# Patient Record
Sex: Female | Born: 1935 | Hispanic: No | Smoking: Former smoker
Health system: Southern US, Community
[De-identification: ages and names within clinical notes are randomized; demographics above are authoritative.]

## PROBLEM LIST (undated history)

## (undated) DIAGNOSIS — I272 Pulmonary hypertension, unspecified: Secondary | ICD-10-CM

## (undated) DIAGNOSIS — K219 Gastro-esophageal reflux disease without esophagitis: Secondary | ICD-10-CM

## (undated) DIAGNOSIS — I313 Pericardial effusion (noninflammatory): Secondary | ICD-10-CM

## (undated) DIAGNOSIS — I340 Nonrheumatic mitral (valve) insufficiency: Secondary | ICD-10-CM

## (undated) DIAGNOSIS — T8859XA Other complications of anesthesia, initial encounter: Secondary | ICD-10-CM

## (undated) DIAGNOSIS — I739 Peripheral vascular disease, unspecified: Secondary | ICD-10-CM

## (undated) DIAGNOSIS — Z950 Presence of cardiac pacemaker: Secondary | ICD-10-CM

## (undated) DIAGNOSIS — I3139 Other pericardial effusion (noninflammatory): Secondary | ICD-10-CM

## (undated) DIAGNOSIS — I469 Cardiac arrest, cause unspecified: Secondary | ICD-10-CM

## (undated) DIAGNOSIS — G4730 Sleep apnea, unspecified: Secondary | ICD-10-CM

## (undated) DIAGNOSIS — T4145XA Adverse effect of unspecified anesthetic, initial encounter: Secondary | ICD-10-CM

## (undated) DIAGNOSIS — J449 Chronic obstructive pulmonary disease, unspecified: Secondary | ICD-10-CM

## (undated) DIAGNOSIS — I442 Atrioventricular block, complete: Secondary | ICD-10-CM

## (undated) DIAGNOSIS — I509 Heart failure, unspecified: Secondary | ICD-10-CM

## (undated) DIAGNOSIS — D649 Anemia, unspecified: Secondary | ICD-10-CM

## (undated) DIAGNOSIS — I779 Disorder of arteries and arterioles, unspecified: Secondary | ICD-10-CM

## (undated) DIAGNOSIS — I214 Non-ST elevation (NSTEMI) myocardial infarction: Secondary | ICD-10-CM

## (undated) DIAGNOSIS — I2510 Atherosclerotic heart disease of native coronary artery without angina pectoris: Secondary | ICD-10-CM

## (undated) DIAGNOSIS — I10 Essential (primary) hypertension: Secondary | ICD-10-CM

## (undated) HISTORY — DX: Atrioventricular block, complete: I44.2

## (undated) HISTORY — PX: TONSILLECTOMY AND ADENOIDECTOMY: SHX28

## (undated) HISTORY — DX: Pulmonary hypertension, unspecified: I27.20

## (undated) HISTORY — DX: Disorder of arteries and arterioles, unspecified: I77.9

## (undated) HISTORY — DX: Pericardial effusion (noninflammatory): I31.3

## (undated) HISTORY — DX: Heart failure, unspecified: I50.9

## (undated) HISTORY — DX: Non-ST elevation (NSTEMI) myocardial infarction: I21.4

## (undated) HISTORY — PX: INSERT / REPLACE / REMOVE PACEMAKER: SUR710

## (undated) HISTORY — PX: BREAST LUMPECTOMY: SHX2

## (undated) HISTORY — DX: Sleep apnea, unspecified: G47.30

## (undated) HISTORY — DX: Atherosclerotic heart disease of native coronary artery without angina pectoris: I25.10

## (undated) HISTORY — DX: Essential (primary) hypertension: I10

## (undated) HISTORY — DX: Other pericardial effusion (noninflammatory): I31.39

## (undated) HISTORY — DX: Chronic obstructive pulmonary disease, unspecified: J44.9

## (undated) HISTORY — DX: Anemia, unspecified: D64.9

## (undated) HISTORY — DX: Nonrheumatic mitral (valve) insufficiency: I34.0

## (undated) HISTORY — DX: Peripheral vascular disease, unspecified: I73.9

## (undated) HISTORY — PX: ABDOMINAL HYSTERECTOMY: SHX81

---

## 2000-10-07 ENCOUNTER — Other Ambulatory Visit: Admission: RE | Admit: 2000-10-07 | Discharge: 2000-10-07 | Payer: Self-pay | Admitting: Otolaryngology

## 2010-06-28 DIAGNOSIS — R0602 Shortness of breath: Secondary | ICD-10-CM

## 2010-07-18 DIAGNOSIS — I509 Heart failure, unspecified: Secondary | ICD-10-CM

## 2010-07-19 DIAGNOSIS — J9600 Acute respiratory failure, unspecified whether with hypoxia or hypercapnia: Secondary | ICD-10-CM

## 2010-07-20 DIAGNOSIS — I059 Rheumatic mitral valve disease, unspecified: Secondary | ICD-10-CM

## 2010-07-23 DIAGNOSIS — I5033 Acute on chronic diastolic (congestive) heart failure: Secondary | ICD-10-CM

## 2010-08-02 ENCOUNTER — Encounter: Payer: Self-pay | Admitting: Cardiology

## 2010-08-15 ENCOUNTER — Encounter (INDEPENDENT_AMBULATORY_CARE_PROVIDER_SITE_OTHER): Payer: Medicare Other | Admitting: Vascular Surgery

## 2010-08-15 ENCOUNTER — Other Ambulatory Visit (INDEPENDENT_AMBULATORY_CARE_PROVIDER_SITE_OTHER): Payer: Medicare Other

## 2010-08-15 DIAGNOSIS — I6529 Occlusion and stenosis of unspecified carotid artery: Secondary | ICD-10-CM

## 2010-08-16 ENCOUNTER — Telehealth: Payer: Self-pay | Admitting: *Deleted

## 2010-08-16 NOTE — Consult Note (Signed)
NEW PATIENT CONSULTATION  Felicia Diaz, Felicia Diaz DOB:  1933-01-20                                       08/15/2010 EAVWU#:[id number]  I saw the patient in the office today in consultation concerning possible carotid disease.  She was referred by Dr. Margo Common.  This is a pleasant 77 year old woman who apparently is being worked up for anemia and underwent a colonoscopy, during which time according to her she became unresponsive when she was sedated.  She underwent a workup including a carotid duplex scan which was done at [facility], which suggested bilateral carotid disease.  This suggested approximately 70% carotid stenoses bilaterally.  Of note, this patient is right- handed.  She denies any previous history of stroke, TIAs, expressive or receptive aphasia, or amaurosis fugax.  PAST MEDICAL HISTORY:  Significant for hypertension, history of congestive heart failure and COPD.  In addition, she has recently been diagnosed with anemia and is undergoing a workup.  She is scheduled for a colonoscopy in June.  SOCIAL HISTORY:  She is widowed.  She does not have any children.  She had smoked half a pack per day of cigarettes for many years but quit in April of this year.  FAMILY HISTORY:  She is unaware of any history of premature cardiovascular disease.  REVIEW OF SYSTEMS:  GENERAL:  She had no recent weight loss, weight gain or problems with her appetite. CARDIOVASCULAR:  She has had no chest pain, chest pressure, palpitations or arrhythmias.  She does admit to dyspnea on exertion.  She had no history of claudication or rest pain.  She has had no history of DVT or phlebitis. HEMATOLOGIC:  She has anemia.  She has had no bleeding problems or clotting disorders. GI, neurologic, pulmonary, GU, ENT, musculoskeletal, psychiatric, integumentary review of systems is unremarkable and is documented on the medical history form in her chart.  PHYSICAL EXAMINATION:   General:  This is a pleasant 77 year old woman who appears her stated age.  Vital signs:  Blood pressure is 148/54, heart rate is 71, respiratory rate is 20.  HEENT:  Unremarkable.  Lungs: Clear bilaterally to auscultation without rales, rhonchi or wheezing. Cardiovascular:  I do not detect any carotid bruits.  She has a regular rate and rhythm.  She has palpable femoral and pedal pulses bilaterally. Abdomen:  Soft and nontender with normal-pitched bowel sounds. Musculoskeletal:  There are no major deformities or cyanosis. Neurologic:  She has no focal weakness or paresthesias.  Skin:  There are no ulcers or rashes.  I have reviewed her duplex scan from [facility].  She was noted have increased velocities there consistent with moderate bilateral carotid disease.  I have independently interpreted her carotid duplex scan here our office and we are not able to demonstrate the same elevated velocities.  She has no evidence of significant carotid disease on either side.  Both vertebral arteries are patent with normally-directed flow and arm pressures are essentially equal.  I have reassured the patient that we see no evidence of significant carotid disease on her duplex.  She has not had any symptoms to suggest significant carotid disease.  There was some tortuosity in the vessels which perhaps explains the elevated velocities seen at the outlying hospital.  I will be happy to see her back at any time if any new vascular issues arise, but that this  point she does not need routine follow-up carotid studies.    Di Kindle. Edilia Bo, M.D. Electronically Signed  CSD/MEDQ  D:  08/15/2010  T:  08/16/2010  Job:  [id number]  cc:   Wyvonnia Lora

## 2010-08-16 NOTE — Telephone Encounter (Signed)
Patient notified.  States she saw Dr. Rubye Oaks yesterday & was told that she did not have any blockages.  Already has OV scheduled with GD for 5/30.  Can discuss further at that visit.

## 2010-08-16 NOTE — Telephone Encounter (Signed)
Message copied by Hoover Brunette on Thu Aug 16, 2010  3:13 PM ------      Message from: Lewayne Bunting      Created: Sat Aug 04, 2010  7:01 AM       Significant carotid artery disease and needs referral to Vascular Surgery- however these labs were ordered by Hospital ist. Not seen patient in office according to notes. Proceed with referral and probably should have appointment with me 1st available.

## 2010-08-22 NOTE — Procedures (Unsigned)
CAROTID DUPLEX EXAM  INDICATION:  Carotid stenosis.  HISTORY: Diabetes:  No. Cardiac:  No. Hypertension:  Yes. Smoking:  Previous. Previous Surgery:  No. CV History:  Currently asymptomatic. Amaurosis Fugax No, Paresthesias No, Hemiparesis No.                                      RIGHT              LEFT Brachial systolic pressure:         168                170 Brachial Doppler waveforms:         Normal             Normal Vertebral direction of flow:        Antegrade          Antegrade DUPLEX VELOCITIES (cm/sec) CCA peak systolic                   101                90 ECA peak systolic                   125                84 ICA peak systolic                   107                P = 83/M = 125 ICA end diastolic                   23                 P = 13/M =29 PLAQUE MORPHOLOGY:                  Heterogenous       Heterogenous PLAQUE AMOUNT:                      Mild               Mild PLAQUE LOCATION:                    ECA/CCA/proximal ICA                 ECA/CCA/proximal ICA  IMPRESSION: 1. No hemodynamically significant stenoses noted in the bilateral     internal carotid arteries with plaque formations as described     above. 2. There is a mildly increased velocity noted in the left mid internal     carotid artery which appears to be due to vessel tortuosity.  ___________________________________________ Di Kindle. Edilia Bo, M.D.  CH/MEDQ  D:  08/16/2010  T:  08/16/2010  Job:  [id number]

## 2010-08-29 ENCOUNTER — Encounter: Payer: PRIVATE HEALTH INSURANCE | Admitting: Cardiology

## 2010-08-29 ENCOUNTER — Encounter: Payer: Self-pay | Admitting: *Deleted

## 2010-08-30 ENCOUNTER — Ambulatory Visit (INDEPENDENT_AMBULATORY_CARE_PROVIDER_SITE_OTHER): Payer: Medicare Other | Admitting: Cardiology

## 2010-08-30 ENCOUNTER — Encounter: Payer: Self-pay | Admitting: Cardiology

## 2010-08-30 VITALS — BP 137/63 | HR 79 | Ht 60.0 in | Wt 124.0 lb

## 2010-08-30 DIAGNOSIS — G4733 Obstructive sleep apnea (adult) (pediatric): Secondary | ICD-10-CM | POA: Insufficient documentation

## 2010-08-30 DIAGNOSIS — I779 Disorder of arteries and arterioles, unspecified: Secondary | ICD-10-CM

## 2010-08-30 DIAGNOSIS — I214 Non-ST elevation (NSTEMI) myocardial infarction: Secondary | ICD-10-CM

## 2010-08-30 DIAGNOSIS — I099 Rheumatic heart disease, unspecified: Secondary | ICD-10-CM | POA: Insufficient documentation

## 2010-08-30 DIAGNOSIS — D509 Iron deficiency anemia, unspecified: Secondary | ICD-10-CM

## 2010-08-30 NOTE — Assessment & Plan Note (Signed)
The patient was found initially to have significant carotid artery disease by Doppler soft [facility] hospital. However in followup with vascular surgery [place] apparently was only mild disease and no further followup is required.

## 2010-08-30 NOTE — Assessment & Plan Note (Signed)
Patient had a mild troponin leak at the time of her acute admission for severe iron deficiency anemia with a hemoglobin of 5.8. However her LV function was normal. CT scan did show evidence of coronary arteries with coronary calcifications. At this point however the patient has significantly improved and denies any chest pain. We will hold off on any further ischemia workup until her colonoscopy is completed and etiology of her anemia has been established.

## 2010-08-30 NOTE — Assessment & Plan Note (Signed)
Patient will have a followup echocardiogram in 6 months. She reports no symptoms of heart failure. A transthoracic echocardiogram showed normal LV function and probably only mild mitral stenosis and regurgitation. Will defer performing a transesophageal echocardiogram, particularly because we do not know the exact cause of the patient's anemia and we will let Dr.Rehman complete EGD and colonoscopy. We'll also decide based on the patient's future symptoms and followup echocardiogram if she needs this study.

## 2010-08-30 NOTE — Progress Notes (Signed)
HPI The patient is a 77 year old female who was 2 months ago admitted to [facility] for profound anemia. She had a ferritin of 3.2. She she was evaluated for anemia by the hospitalist service. It was felt the patient of possible GI bleed. Initial hemoglobin was 5.8. She had a metabolic encephalopathy secondary to hypercapnia from narcotic use your colonoscopy. The patient was seen in consultation for lower extremity edema and presumed congestive heart failure. An echocardiogram done a month before the admission showed that she has multivalve alert heart disease with mild MR, mild MS and mild aortic stenosis with ejection fraction of 65%. Right ventricular pressure was elevated at 57 mm of mercury dilated IVC. The patient did not have a personal history of rheumatic fever but she does recall her sister had this problem. She had significant cardiomegaly on chest x-ray and was concerned the patient could have more severe mitral stenosis particularly in light of her pulmonary hypertension. She also had clearly rheumatic valve with bedside echocardiogram. I recommended a possible TEE for further evaluation however the patient multiple other medical problems any particular she needed a workup for her anemia and we recommended that we would see her in followup in the outpatient setting and decide whether she needed a transesophageal echocardiogram. The patient was also found to have significant carotid artery disease with 70% stenosis the right internal carotid artery and approximately 70% stenosis of the left internal carotid artery. However in followup with Dr. Durwin Nora there was no evidence of significant carotid artery disease. She also had a CT scan done which showed that there was evidence for coronary artery disease with coronary calcifications. The patient had a mild troponin leak during her hospitalization and a question was also raised whether to evaluate her further for ischemic heart disease. This was  deferred during this hospitalization however given her severe anemia. The patient was transferred to skilled nursing facilities. She was placed on oxygen therapy. She also followup appointment scheduled with Dr. Orson Aloe as well as a GI appointment Dr. Karilyn Cota to complete her colonoscopy. As outlined above she is scheduled appointment which [facility] vascular surgery who found that on repeat Doppler she did not have significant carotid artery disease.  Allergies  Allergen Reactions  . Penicillins Itching and Rash    Current Outpatient Prescriptions on File Prior to Visit  Medication Sig Dispense Refill  . docusate sodium (COLACE) 100 MG capsule Take 100 mg by mouth 2 (two) times daily.        . Fluticasone-Salmeterol (ADVAIR DISKUS) 250-50 MCG/DOSE AEPB Inhale 1 puff into the lungs every 12 (twelve) hours.        . furosemide (LASIX) 20 MG tablet Take 20 mg by mouth daily.        Marland Kitchen ipratropium-albuterol (DUONEB) 0.5-2.5 (3) MG/3ML SOLN Take 3 mLs by nebulization every 4 (four) hours as needed.        . iron polysaccharides (NU-IRON) 150 MG capsule Take 150 mg by mouth 2 (two) times daily.        Marland Kitchen tiotropium (SPIRIVA) 18 MCG inhalation capsule Place 18 mcg into inhaler and inhale daily.        Marland Kitchen DISCONTD: acetaminophen (TYLENOL) 650 MG CR tablet Take 650 mg by mouth every 4 (four) hours.          Past Medical History  Diagnosis Date  . CHF (congestive heart failure)     2D echo showed mild LVH and  hyperdynamic Left  ventricle with EF > 65%  .  Pulmonary hypertension, mild   . Pericardial effusion     small pericardial effusion without evidence of amponade  . Hypertension   . Anemia     Admitted with severe anemia hemoglobin 5.8 colonoscopy pending  . Rheumatic heart disease      mild mitral stenosis and mild mitral regurgitation no personal history of rheumatic fever but history of rheumatic fever and sister   . Subendocardial infarction     In the setting of severe anemia. Mild  troponin leak  . Carotid artery disease     Past Surgical History  Procedure Date  . Abdominal hysterectomy   . Breast lumpectomy     Family History  Problem Relation Age of Onset  . Colon cancer Father     died age 19    History   Social History  . Marital Status: Married    Spouse Name: N/A    Number of Children: N/A  . Years of Education: N/A   Occupational History  . Not on file.   Social History Main Topics  . Smoking status: Former Smoker -- 0.5 packs/day for 55 years    Types: Cigarettes    Quit date: 08/03/2010  . Smokeless tobacco: Never Used  . Alcohol Use: No  . Drug Use: No  . Sexually Active: Not on file   Other Topics Concern  . Not on file   Social History Narrative   WidowLives alone/currently staying at [facility] has a very close friend who she speaks with on a regular basis whom is very involved in her life   KGM:WNUUVOZDG positives as outlined above. The remainder of the 18  point review of systems is negative   PHYSICAL EXAM BP 137/63  Pulse 79  Ht 5' (1.524 m)  Wt 124 lb (56.246 kg)  BMI 24.22 kg/m2  SpO2 95%  General: Well-developed, well-nourished in no distress Head: Normocephalic and atraumatic Eyes:PERRLA/EOMI intact, conjunctiva and lids normal Ears: No deformity or lesions Mouth:normal dentition, normal posterior pharynx Neck: Supple, no JVD.  No masses, thyromegaly or abnormal cervical nodes Lungs: Normal breath sounds bilaterally without wheezing.  Normal percussion Cardiac: regular rate and rhythm with normal S1 and S2, no S3 or S4.  PMI is normal.  Soft early diastolic murmur. 2/6 holosystolic murmur at the apex Abdomen: Normal bowel sounds, abdomen is soft and nontender without masses, organomegaly or hernias noted.  No hepatosplenomegaly MSK: Back normal, normal gait muscle strength and tone normal Vascular: Pulse is normal in all 4 extremities Extremities: No peripheral pitting edema Neurologic: Alert  and oriented x 3 Skin: Intact without lesions or rashes Lymphatics: No significant adenopathyPsychologic: Normal affect  ECG: Not available  ASSESSMENT AND PLAN

## 2010-08-30 NOTE — Patient Instructions (Signed)
   Echo in 6 months just prior to next office visit. Your physician wants you to follow up in: 6 months.  You will receive a reminder letter in the mail one-two months in advance.  If you don't receive a letter, please call our office to schedule the follow up appointment

## 2010-08-30 NOTE — Assessment & Plan Note (Signed)
This is not an established diagnosis yet. The patient was placed on CPAP which he continues to use. Oxygen saturation however it remains within normal limits at rest and during exertion. Will defer decisions regarding use of this device to Dr. Orson Aloe in followup.

## 2010-08-30 NOTE — Assessment & Plan Note (Signed)
The patient has followup with her primary care physician regarding her blood work.

## 2010-09-04 ENCOUNTER — Ambulatory Visit (INDEPENDENT_AMBULATORY_CARE_PROVIDER_SITE_OTHER): Payer: Medicare Other | Admitting: Internal Medicine

## 2010-09-04 DIAGNOSIS — K921 Melena: Secondary | ICD-10-CM

## 2010-09-04 DIAGNOSIS — D509 Iron deficiency anemia, unspecified: Secondary | ICD-10-CM

## 2010-09-06 ENCOUNTER — Other Ambulatory Visit: Payer: Self-pay | Admitting: Anesthesiology

## 2010-09-06 ENCOUNTER — Encounter (HOSPITAL_COMMUNITY): Payer: Medicare Other

## 2010-09-06 LAB — BASIC METABOLIC PANEL
BUN: 8 mg/dL (ref 6–23)
CO2: 30 mEq/L (ref 19–32)
Chloride: 94 mEq/L — ABNORMAL LOW (ref 96–112)
Glucose, Bld: 86 mg/dL (ref 70–99)
Potassium: 4.8 mEq/L (ref 3.5–5.1)

## 2010-09-06 LAB — CBC
HCT: 40.8 % (ref 36.0–46.0)
Hemoglobin: 13 g/dL (ref 12.0–15.0)
MCH: 26.5 pg (ref 26.0–34.0)
MCV: 83.3 fL (ref 78.0–100.0)
RBC: 4.9 MIL/uL (ref 3.87–5.11)
WBC: 8.7 10*3/uL (ref 4.0–10.5)

## 2010-09-07 ENCOUNTER — Ambulatory Visit (HOSPITAL_COMMUNITY)
Admission: RE | Admit: 2010-09-07 | Discharge: 2010-09-07 | Disposition: A | Discharge status: Home / Self Care | Payer: Medicare Other | Source: Ambulatory Visit | Attending: Internal Medicine | Admitting: Internal Medicine

## 2010-09-07 ENCOUNTER — Encounter (HOSPITAL_BASED_OUTPATIENT_CLINIC_OR_DEPARTMENT_OTHER): Payer: Medicare Other | Admitting: Internal Medicine

## 2010-09-07 ENCOUNTER — Other Ambulatory Visit (INDEPENDENT_AMBULATORY_CARE_PROVIDER_SITE_OTHER): Payer: Self-pay | Admitting: Internal Medicine

## 2010-09-07 DIAGNOSIS — D126 Benign neoplasm of colon, unspecified: Secondary | ICD-10-CM

## 2010-09-07 DIAGNOSIS — D509 Iron deficiency anemia, unspecified: Secondary | ICD-10-CM | POA: Insufficient documentation

## 2010-09-07 DIAGNOSIS — I10 Essential (primary) hypertension: Secondary | ICD-10-CM | POA: Insufficient documentation

## 2010-09-07 DIAGNOSIS — K319 Disease of stomach and duodenum, unspecified: Secondary | ICD-10-CM | POA: Insufficient documentation

## 2010-09-07 DIAGNOSIS — K921 Melena: Secondary | ICD-10-CM

## 2010-09-07 DIAGNOSIS — K2980 Duodenitis without bleeding: Secondary | ICD-10-CM | POA: Insufficient documentation

## 2010-09-07 DIAGNOSIS — D131 Benign neoplasm of stomach: Secondary | ICD-10-CM

## 2010-09-07 DIAGNOSIS — Z01818 Encounter for other preprocedural examination: Secondary | ICD-10-CM | POA: Insufficient documentation

## 2010-09-07 DIAGNOSIS — K259 Gastric ulcer, unspecified as acute or chronic, without hemorrhage or perforation: Secondary | ICD-10-CM

## 2010-09-07 DIAGNOSIS — K2960 Other gastritis without bleeding: Secondary | ICD-10-CM | POA: Insufficient documentation

## 2010-09-07 DIAGNOSIS — J4489 Other specified chronic obstructive pulmonary disease: Secondary | ICD-10-CM | POA: Insufficient documentation

## 2010-09-07 DIAGNOSIS — K2100 Gastro-esophageal reflux disease with esophagitis, without bleeding: Secondary | ICD-10-CM | POA: Insufficient documentation

## 2010-09-07 DIAGNOSIS — Z01812 Encounter for preprocedural laboratory examination: Secondary | ICD-10-CM | POA: Insufficient documentation

## 2010-09-07 DIAGNOSIS — Z79899 Other long term (current) drug therapy: Secondary | ICD-10-CM | POA: Insufficient documentation

## 2010-09-07 DIAGNOSIS — K5730 Diverticulosis of large intestine without perforation or abscess without bleeding: Secondary | ICD-10-CM | POA: Insufficient documentation

## 2010-09-07 DIAGNOSIS — K644 Residual hemorrhoidal skin tags: Secondary | ICD-10-CM | POA: Insufficient documentation

## 2010-09-07 DIAGNOSIS — K2990 Gastroduodenitis, unspecified, without bleeding: Secondary | ICD-10-CM

## 2010-09-07 DIAGNOSIS — K5520 Angiodysplasia of colon without hemorrhage: Secondary | ICD-10-CM | POA: Insufficient documentation

## 2010-09-07 DIAGNOSIS — J449 Chronic obstructive pulmonary disease, unspecified: Secondary | ICD-10-CM | POA: Insufficient documentation

## 2010-10-01 NOTE — Consult Note (Signed)
NAMEKHANH, Diaz               ACCOUNT NO.:  [id number]  MEDICAL RECORD NO.:  [id number]  LOCATION:  DAY                           FACILITY:  [facility]  PHYSICIAN:  Lionel December, M.D.    DATE OF BIRTH:  28-Oct-1932  DATE OF CONSULTATION: DATE OF DISCHARGE:                                CONSULTATION   REASON FOR CONSULTATION:  Iron deficiency anemia.  HISTORY OF PRESENT ILLNESS:  Felicia Diaz is a 77 year old white female admitted to [facility] on July 17, 2010, for profound anemia.  She was advised by her family physician, Dr. Margo Common to present to the emergency room.  She had a hemoglobin of 5.8 and a hematocrit of 20.4, her MCV was 66.7 on admission.  She tells me there is no prior history of her having anemia.  She did receive 4 units of blood while in the hospital.  Felicia Diaz was also admitted with anemia, shortness of breath and peripheral edema).  She was referred to Dr. Ananias Pilgrim to have a colonoscopy for her profound anemia.Marland Kitchen  He performed a colonoscopy on July 19, 2010.  The colonoscopy was incomplete, the findings: there was no active bleeding noted.  Scattered diverticula were found in the sigmoid colon.  A sessile polyp was found in the proximal transverse colon, and cold biopsy and a piece removed via hot snare.  A sessile polyp was found in the sigmoid colon.  Multiple sessile polyps left alone secondary to respiratory distress.  Retroflexion was not performed and the scope was then withdrawn from the patient and the procedure was terminated.  During the colonoscopy procedure, the patient desaturated and became cyanotic during biopsy of the polyp in the transverse colon. The scope was withdrawn.  She was transferred to ICU at that point. She received were atropine, glucagon, fentanyl, Romazicon, Versed and Narcan during the colonoscopy procedure.  She says her appetite has been good.  There has been no weight loss.  No dysphagia.  She denies any acid  reflux.  She does occasionally have burping.  She does not have any epigastric or abdominal pain.  She usually has a bowel movement once a day and sometimes 2.  She does tell me her stools have been dark in color since taking the iron.  She denies any melena or bright red rectal bleeding. She says she has been on no NSAIDs since recently, however, she was taking 81 mg of aspirin up until her hospital admission.  Her iron studies on July 17, 2010, iron 18, transferrin 499, TIBC was 695, percent saturation was 3, vitamin B12 was 952, folate 22.5, ferritin was low at 3.2.  She did have respiratory failure while in the hospital due to COPD.  [facility] and spent 1 week there  and then was discharged.  ALLERGIES:  SHE IS ALLERGIC TO PENICILLIN.  HOME MEDICATIONS:  Include 1. MVI one a day. 2. Vitamin D3 1000 units a day. 3. Lasix 20 mg a day. 4. Spiriva once a day. 5. Advair 250/50 b.i.d. usually. 6. OsCal 500 mg twice a day. 7. Nu-Iron 150 mg twice a day. 8. Colace 100 mg a day.  SURGERIES:  She has had a colonoscopy on July 19, 2010, by Dr. Pamalee Leyden, which I have included in the history of present illness.  She had a hysterectomy in 1972 or 1973 for benign tumor on the right ovary. She has had breast biopsies, which were benign in 1960s and then in the 1990s.  She had tonsillectomy in 1954.  MEDICAL HISTORY:  Includes a recent history of severe anemia requiring blood transfusion, respiratory failure due to COPD, chronic diastolic heart failure, coronary artery disease, a leaky heart valve, hypertension.  FAMILY HISTORY:  Her mother is deceased at age 8 from natural causes. Father is deceased at age 62.  He had a history of coronary artery disease and he had colon cancer.  The colon cancer was diagnosed around age 26.  Sisters:  Two sisters, one is deceased after undergoing a CABG at age 53, one is alive at age 66  with dementia.  Brothers:  She has 4 brothers deceased, one had cancer of the left shoulder, one had some type of cancer in his chest, one brother had cancer, but she could not remember where the cancer was and one brother deceased from breathing problems.  She has one brother in good health.  She is widowed.  She is retired from [employer].  She quit smoking 2 months ago after smoking a half a pack for over 55 years.  She occasionally drinks a beer.  There are no children.  SUBJECTIVE:  VITAL SIGNS:  Her weight is 127, her height is 5 feet.  Her temperature is 97.9, her blood pressure is 136/60, her pulse is 76. HEENT:  She has natural teeth in good condition.  Her oral mucosa is moist.  There are no lesions.  Her conjunctiva is pink.  Her sclerae are anicteric.  Her thyroid is normal.  There is no cervical lymphadenopathy. LUNGS:  She has bilateral wheezes. HEART:  Regular rate and rhythm.  I do not hear a murmur.  She does tell me she has one. ABDOMEN:  Soft.  Bowel sounds are positive.  No masses felt.  Her stool was grossly guaiac positive today. EXTREMITIES:  There is no edema to her lower extremities.  LABORATORY DATA:  Labs on July 25, 2010, white count was 10.5, RBC was 4.31, hemoglobin 9.1, hematocrit 33.1, her MCV was 76.9, platelets were 200.  On July 24, 2010, her glucose was 109, BUN was 8, creatinine was 0.48, calcium 8.9, sodium 133, potassium 3.7, chloride 90, anion gap was 38.  ASSESSMENT:  Felicia Diaz is a 77 year old female with a new onset of iron deficiency anemia.  She was guaiac-positive today.  Peptic ulcer disease or a colonic neoplasm or an arteriovenous malformation  need to be ruled out.  RECOMMENDATIONS:  We will schedule a colonoscopy in near future. If the colonoscopy is normal, an EGD will be performed.  I did discuss the risk and benefits with the patient and she is agreeable.  We will also get a CBC on her the day of the procedure. I  duscussed this case with  Dr. Karilyn Cota.  Thank you for allowing [place] to participate in her care.    ______________________________ Dorene Ar, NP   ______________________________ Lionel December, M.D.    TS/MEDQ  D:  09/04/2010  T:  09/05/2010  Job:  [id number]  cc:   Wyvonnia Lora, MD Fax: [phone]  Electronically Signed by Dorene Ar PA on 09/06/2010 62:13:08 AM Electronically Signed by Lionel December M.D. on 10/01/2010 12:26:15 AM

## 2010-10-01 NOTE — Op Note (Signed)
Felicia Diaz, Felicia Diaz               ACCOUNT NO.:  [id number]  MEDICAL RECORD NO.:  [id number]  LOCATION:  DAYP                          FACILITY:  [facility]  PHYSICIAN:  Lionel December, M.D.    DATE OF BIRTH:  1932/05/13  DATE OF PROCEDURE:  09/07/2010 DATE OF DISCHARGE:                              OPERATIVE REPORT   PROCEDURE:  Colonoscopy with snare polypectomy followed by esophagogastroduodenoscopy.  INDICATION:  Felicia Diaz is 77 year old Caucasian female who is admitted to [facility] about 6 weeks ago with hemoglobin of 5.8 g.  She was found to have iron deficiency anemia.  She received 4 units of PRBCs.  She has history of COPD and CAD with diastolic dysfunction.  She underwent colonoscopy by Dr. Cristy Folks.  Her colonoscopy could not be completed because she developed respiratory distress and desaturated and became cyanotic.  She was monitored in ICU and had uneventful recovery.  She is now undergoing examination with propofol further close monitoring.  There is no history of melena, rectal bleeding, or abdominal pain.  She had been on low-dose asa.  Procedure risks were reviewed with the patient and informed consent was obtained.  I also talked with the daughter.  MEDS FOR SEDATION ANESTHESIA:  Please see anesthesia records for details.  The patient was given MAC.  FINDINGS:  Procedure performed in the OR.  The patient was placed in left lateral recumbent position.  PROCEDURES: 1. Colonoscopy.  Rectal examination performed.  No abnormality noted     on external digital exam.  Pentax videoscope was placed in rectum     and advanced under vision into sigmoid colon and beyond.     Preparation was excellent.  She had few diverticula at sigmoid     colon.  Scope was passed into cecum.  There was a 4-mm polyp to the     right appendiceal orifice which was ablated via cold biopsy.  There     was 12-13 mm polyp on a fold across ileocecal valve.  This was     snared and retrieved using [employer].  There were few small     arteriovenous malformations two at cecum, and another one at the     ascending colon, these were not bleeding, were left alone.  There     are three polyps at transverse colon, two of these were raised     since they were sessile, the largest one was about 10 mm.  The     third polyp was snared without saline injection.  Polypectomy was     complete.  All of these pieces were retrieved for sludge     examination.  Mucosa and rest of the colon was normal.  Rectal     mucosa similarly was normal.  Scope was retroflexed to examine     anorectal junction and small hemorrhoids noted below the dentate     line.  Since, I did not see any significant lesion to account for,     iron deficiency anemia suggests carcinoma proceeded with EGD.  Esophagogastroduodenoscopy.  Pentax videoscope was passed through oropharynx without any difficulty into esophagus.  Esophagus.  Mucosa of the esophagus normal except distally.  She had wavy GE junction and two small ulcers at GE junction.  GE junction was located at 34 cm and hiatus was at 37.  Mucosa herniated part of stomach was normal.  Stomach.  It was empty and distended very well by insufflation.  Folds of proximal stomach are normal.  Examination of mucosa revealed few erosions at gastric body and antrum, but no ulcer crater or other abnormalities were noted.  Pyloric channel was patent.  Angularis, fundus, and cardia were examined by retroflexion of scope and was unremarkable.  Duodenum.  There was patchy erythema and multiple small erosions, but no ulcer crater was noted.  There was small cluster of polyps just past the pylorus, possibly Brunner gland hypertrophy, but biopsy was taken to make sure this was not an adenoma.  Scope was passed to second part of duodenal mucosa and folds were normal.  Endoscope was withdrawn.  On the way out biopsy was taken from GE junction to rule out short segment Barrett's.   Endoscope was withdrawn.  The patient tolerated the procedure well.  Please note the colonic withdrawal time was 40 minutes.  FINAL DIAGNOSES: 1. Multiple findings. 2. Small cecal polyp ablated via cold biopsy.  12-13 mm cecal polyp     snared. 3. Two sessile polyps snared from transverse colon with saline     injection, smaller ones with 6-7 mm, other one was about 10 mm.     Another polyp snared from transverse colon. 4. Scattered diverticula at sigmoid colon external hemorrhoids. 5. A few small arteriovenous malformations involving the right colon,     none with stigmata of bleeding, therefore not treated. 6. Ulcerative esophagitis with small sliding hiatal hernia.  Wavy GE     junction biopsy taken to rule out short segment Barrett's. 7. Erosive gastritis and bulbar duodenitis. 8. Bulbar polyp was biopsied for histology is possibly Brunner gland     hypertrophy. 9. She could have been losing blood from her upper GI tract or from     these arteriovenous malformations.  I doubt that she has been     losing blood from these polyps.  Please note patient's hemoglobin yesterday was 13 g.  RECOMMENDATIONS: 1. We will check her H. pylori serology. 2. Antireflux measures. 3. Pantoprazole 40 mg p.o. q.a.m. prescription given for 30 with 11     refills. 4. The patient will resume her usual meds and go back on a Nu-Iron. 5. No aspirin for 2 weeks.  I will be contacting patient with results of biopsy and blood test and further recommendations.          ______________________________ Lionel December, M.D.     NR/MEDQ  D:  09/07/2010  T:  09/07/2010  Job:  [id number]  cc:   Felicia Lora, MD Fax: [phone]  Jonita Al[place], [place] Dr. Cristy Folks  Electronically Signed by Lionel December M.D. on 10/01/2010 12:26:44 AM

## 2010-12-20 ENCOUNTER — Ambulatory Visit (INDEPENDENT_AMBULATORY_CARE_PROVIDER_SITE_OTHER): Payer: Medicare Other | Admitting: Internal Medicine

## 2010-12-20 ENCOUNTER — Encounter (INDEPENDENT_AMBULATORY_CARE_PROVIDER_SITE_OTHER): Payer: Self-pay | Admitting: Internal Medicine

## 2010-12-20 ENCOUNTER — Telehealth (INDEPENDENT_AMBULATORY_CARE_PROVIDER_SITE_OTHER): Payer: Self-pay | Admitting: *Deleted

## 2010-12-20 VITALS — BP 120/70 | HR 68 | Temp 98.7°F | Resp 16 | Ht 60.0 in | Wt 133.0 lb

## 2010-12-20 DIAGNOSIS — K922 Gastrointestinal hemorrhage, unspecified: Secondary | ICD-10-CM

## 2010-12-20 DIAGNOSIS — D509 Iron deficiency anemia, unspecified: Secondary | ICD-10-CM

## 2010-12-20 DIAGNOSIS — D649 Anemia, unspecified: Secondary | ICD-10-CM

## 2010-12-20 NOTE — Progress Notes (Signed)
Subjective:     Patient ID: Felicia Diaz, female   DOB: Oct 31, 1932, 77 y.o.   MRN: [id number]  HPI [name] is 77 yr old female here for follow up.  She says she is still losing blood. She received a unit of blood last Friday at [facility].  Two week ago her Hemoglobin was 7.4.  She tells me her stool are dark  however she is on iron.  In Aparil of this year ishe had a hemoglobin of 5.8.  Her MCV 66.7.  She received 4 units of PRBC while at [facility].  She underwent a colonoscopy at  [facility] for anemia. She desaturated during the colonoscopy procedure. She became cyanotic during biopsy of the polyp in the transverse colon. Procedure was terminated.  There was no active bleeding during the colonoscopy. She denies seeing bright red rectal bleeding or melena. Colonoscopy and EGD in June of this year by Dr. Karilyn Cota FINAL DIAGNOSES:  1. Multiple findings.  2. Small cecal polyp ablated via cold biopsy. 12-13 mm cecal polyp  snared.  3. Two sessile polyps snared from transverse colon with saline  injection, smaller ones with 6-7 mm, other one was about 10 mm.  Another polyp snared from transverse colon.  4. Scattered diverticula at sigmoid colon external hemorrhoids.  5. A few small arteriovenous malformations involving the right colon,  none with stigmata of bleeding, therefore not treated.  6. Ulcerative esophagitis with small sliding hiatal hernia. Wavy GE  junction biopsy taken to rule out short segment Barrett's.  7. Erosive gastritis and bulbar duodenitis.  8. Bulbar polyp was biopsied for histology is possibly Brunner gland  hypertrophy.  9. She could have been losing blood from her upper GI tract or from  these arteriovenous malformations. I doubt that she has been  losing blood from these polyps.  Please note patient's hemoglobin yesterday was 13 g.    Review of Systems see hpi     Objective:   Physical Exam Alert and oriented. Skin warm and dry. Oral mucosa is moist. Natural teeth in good  condition. Sclera anicteric, conjunctivae is pink. Thyroid not enlarged. No cervical lymphadenopathy. Lungs clear. Heart regular rate and rhythm.  Abdomen is soft. Bowel sounds are positive. No hepatomegaly. No abdominal masses felt. No tenderness.Stool was guaiac positive.  No edema to lower extremities. Patient is alert and oriented.     Assessment:    Anemia.  GI blood loss. Guaiac positive stool.  I discussed this case with Dr. Karilyn Cota,.    Plan:    Given capsule study. Further recommendations once we have the Capsule study back.

## 2010-12-20 NOTE — Telephone Encounter (Signed)
GIVENS capsule study sch'd 01/02/11 @ 7:30 (7:00), iron 7 days, asa 2 days

## 2011-01-02 ENCOUNTER — Ambulatory Visit (HOSPITAL_COMMUNITY)
Admission: RE | Admit: 2011-01-02 | Discharge: 2011-01-02 | Disposition: A | Discharge status: Home / Self Care | Payer: Medicare Other | Source: Ambulatory Visit | Attending: Internal Medicine | Admitting: Internal Medicine

## 2011-01-02 ENCOUNTER — Encounter (HOSPITAL_COMMUNITY): Payer: Self-pay | Admitting: *Deleted

## 2011-01-02 ENCOUNTER — Encounter (HOSPITAL_COMMUNITY)
Admission: RE | Disposition: A | Discharge status: Home / Self Care | Payer: Self-pay | Source: Ambulatory Visit | Attending: Internal Medicine

## 2011-01-02 DIAGNOSIS — D509 Iron deficiency anemia, unspecified: Secondary | ICD-10-CM | POA: Insufficient documentation

## 2011-01-02 DIAGNOSIS — K921 Melena: Secondary | ICD-10-CM | POA: Insufficient documentation

## 2011-01-02 HISTORY — PX: GIVENS CAPSULE STUDY: SHX5432

## 2011-01-02 SURGERY — IMAGING PROCEDURE, GI TRACT, INTRALUMINAL, VIA CAPSULE
Anesthesia: LOCAL

## 2011-01-03 ENCOUNTER — Encounter (HOSPITAL_COMMUNITY): Payer: Self-pay | Admitting: Internal Medicine

## 2011-01-07 ENCOUNTER — Telehealth (INDEPENDENT_AMBULATORY_CARE_PROVIDER_SITE_OTHER): Payer: Self-pay | Admitting: *Deleted

## 2011-01-07 DIAGNOSIS — D649 Anemia, unspecified: Secondary | ICD-10-CM

## 2011-01-07 NOTE — Telephone Encounter (Signed)
This has been addressed. Order faxed to [facility]

## 2011-01-07 NOTE — Telephone Encounter (Signed)
Per dr. Karilyn Cota the patient will need cbc drawn

## 2011-01-12 NOTE — Op Note (Signed)
Job [id number]

## 2011-01-12 NOTE — Op Note (Signed)
NAMEAIDAH, FORQUER               ACCOUNT NO.:  [id number]  MEDICAL RECORD NO.:  [id number]  LOCATION:  [facility]                          FACILITY:  [facility]  PHYSICIAN:  Lionel December, M.D.    DATE OF BIRTH:  06/12/1932  DATE OF PROCEDURE:  01/02/2011 DATE OF DISCHARGE:  01/02/2011                              OPERATIVE REPORT   PROCEDURE:  Small bowel Given capsule study.  SURGEON:  Lionel December, MD  INDICATION:  The patient is a 77 year old Caucasian female with recurrent GI bleed.  She had 4 units back in April when she presented with a hemoglobin of 5.8 g.  This was at [facility] in [place].  Her colonoscopy was incomplete.  I saw her in June and she had EGD and a colonoscopy on September 07, 2010 with multiple findings.  She had 4 polyps removed from the colon and 3 were adenomas.  She had scattered sigmoid diverticula and few small AV malformation in the right colon without stigmata of bleeding.  She had ulcerative/erosive reflux esophagitis.  She also had a duodenal polyp which was biopsied and turned out to be metaplasia. Before the procedure, the patient's hemoglobin was 13.1 g.  We also checked her H. pylori serology and was negative.  She has been maintained on PPI.  Her hemoglobin dropped again and last month she received another unit of PRBCs.  It is therefore felt that she may be losing blood from her small bowel and therefore undergoing this study. Procedure risks were reviewed with the patient and informed consent was obtained.  FINDINGS:  The patient was able to swallow Given capsule without any difficulty.  It reached the stomach in 27 seconds and in the duodenal bulb in 7 minutes and 55 seconds and ileocecal valve in 1 hour 46 minutes and 15 seconds.  This was an 8 hour study.  Esophageal mucosa was normal, although this was a limited view.  There is a duodenal polyp seen at 9 minutes and 8 seconds which has previously been biopsied.  There was also focal edema and  erythema involving the bulb.  AV malformations noted at 53 minutes and 37 seconds, 57 minutes and 44 seconds, and at 58 minutes and 30 seconds.  There is a small pool of fresh blood best seen on image taken at 1 hour 5 minutes and 47 seconds.  Superior margin suggested maybe an ulcer but this may well be another AV malformation.  Ill-defined submucosal lesions noted at 1 hour 26 minutes and 12 seconds and another one at 1 hour 35 minutes and 51 seconds.  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  Area with pooling of blood at 1 hour 5 minutes and 47 seconds, possibly another AV malformation with active bleeding/oozing, although towards the superior margin suspicious for an ulcer.  Ill-defined some mucosal lesions at 1 hour 26 minutes and 12 seconds and another one at 1 hour 35 minutes and 51 seconds.  This may be a submucosal cyst or even an artifact.  RECOMMENDATIONS:  These findings were discussed with the patient and we are in the process of  making an appointment with Dr. Achilles Dunk for small bowel endoscopy.  I have already talked with Dr. Achilles Dunk.  Since this study was done, the patient's hemoglobin dropped to 7.2 g and she has received 1 and possibly 2 units.  She has warm antibodies which resulted in problems with cross matching.  The patient is advised not to take her iron or aspirin anymore.          ______________________________ Lionel December, M.D.     NR/MEDQ  D:  01/12/2011  T:  01/12/2011  Job:  [id number]  cc:   Wyvonnia Lora, MD Fax: [phone]

## 2011-02-05 ENCOUNTER — Other Ambulatory Visit: Payer: Self-pay | Admitting: Cardiology

## 2011-02-05 DIAGNOSIS — I099 Rheumatic heart disease, unspecified: Secondary | ICD-10-CM

## 2011-02-05 DIAGNOSIS — I509 Heart failure, unspecified: Secondary | ICD-10-CM

## 2011-02-11 ENCOUNTER — Telehealth (INDEPENDENT_AMBULATORY_CARE_PROVIDER_SITE_OTHER): Payer: Self-pay | Admitting: *Deleted

## 2011-02-11 NOTE — Telephone Encounter (Signed)
Per Dr Karilyn Cota, patient need H&H done since her appt w/ Dr Gwinda Passe isn't until Jan, 2013, Dr Karilyn Cota didn't say when she would need H&H done

## 2011-02-14 ENCOUNTER — Telehealth (INDEPENDENT_AMBULATORY_CARE_PROVIDER_SITE_OTHER): Payer: Self-pay | Admitting: *Deleted

## 2011-02-14 DIAGNOSIS — D649 Anemia, unspecified: Secondary | ICD-10-CM

## 2011-02-14 NOTE — Telephone Encounter (Signed)
Lab order will be faxed to [facility] first part of December 2012 .

## 2011-02-14 NOTE — Telephone Encounter (Signed)
Lab order noted for 03-02-11 at [facility] , patient will be made aware.

## 2011-02-28 ENCOUNTER — Telehealth (INDEPENDENT_AMBULATORY_CARE_PROVIDER_SITE_OTHER): Payer: Self-pay | Admitting: *Deleted

## 2011-02-28 DIAGNOSIS — D509 Iron deficiency anemia, unspecified: Secondary | ICD-10-CM

## 2011-02-28 NOTE — Telephone Encounter (Signed)
Patient called and states that during the Thanksgiving Hoild[place] her family members told her how very pale she was. She would like to have her Hgb/Hct drawn to be sure it is ok. Mrs. Ruble denies SOB, Dizziness, weakness,ect. Per Dr. Karilyn Cota gave verbal permission that this could be done. Order faxed to [facility] per patient's request.

## 2011-03-07 ENCOUNTER — Other Ambulatory Visit: Payer: Medicare Other | Admitting: *Deleted

## 2011-03-13 ENCOUNTER — Other Ambulatory Visit (INDEPENDENT_AMBULATORY_CARE_PROVIDER_SITE_OTHER): Payer: Medicare Other | Admitting: *Deleted

## 2011-03-13 DIAGNOSIS — I214 Non-ST elevation (NSTEMI) myocardial infarction: Secondary | ICD-10-CM

## 2011-03-13 DIAGNOSIS — I099 Rheumatic heart disease, unspecified: Secondary | ICD-10-CM

## 2011-03-13 DIAGNOSIS — I509 Heart failure, unspecified: Secondary | ICD-10-CM

## 2011-03-13 DIAGNOSIS — I052 Rheumatic mitral stenosis with insufficiency: Secondary | ICD-10-CM

## 2011-03-14 ENCOUNTER — Telehealth (INDEPENDENT_AMBULATORY_CARE_PROVIDER_SITE_OTHER): Payer: Self-pay | Admitting: *Deleted

## 2011-03-14 NOTE — Telephone Encounter (Signed)
Patient has already had her labs and was to be T&C for 2 units of PRBC. Her PCP agreed to over see this as she wanted to be transfused at [facility].

## 2011-03-15 ENCOUNTER — Encounter: Payer: Self-pay | Admitting: Cardiology

## 2011-03-15 ENCOUNTER — Ambulatory Visit (INDEPENDENT_AMBULATORY_CARE_PROVIDER_SITE_OTHER): Payer: Medicare Other | Admitting: Cardiology

## 2011-03-15 VITALS — BP 136/58 | HR 82 | Ht 60.0 in | Wt 143.0 lb

## 2011-03-15 DIAGNOSIS — I779 Disorder of arteries and arterioles, unspecified: Secondary | ICD-10-CM

## 2011-03-15 DIAGNOSIS — D509 Iron deficiency anemia, unspecified: Secondary | ICD-10-CM

## 2011-03-15 DIAGNOSIS — D649 Anemia, unspecified: Secondary | ICD-10-CM

## 2011-03-15 DIAGNOSIS — I214 Non-ST elevation (NSTEMI) myocardial infarction: Secondary | ICD-10-CM

## 2011-03-15 DIAGNOSIS — I099 Rheumatic heart disease, unspecified: Secondary | ICD-10-CM

## 2011-03-15 MED ORDER — FERROUS SULFATE 325 (65 FE) MG PO TABS: 325.0000 mg | ORAL_TABLET | Freq: Three times a day (TID) | ORAL | Status: DC

## 2011-03-15 NOTE — Patient Instructions (Signed)
Your physician wants you to follow-up in: 6 months. You will receive a reminder letter in the mail one-two months in advance. If you don't receive a letter, please call our office to schedule the follow-up appointment. Iron 325 mg-Take 1 tablet by mouth three times per day.  Your physician recommends that you go to the [facility] for lab work: ferritin, iron, iron saturation. If the results of your test are normal or stable, you will receive a letter. If they are abnormal, the nurse will contact you by phone.

## 2011-03-16 ENCOUNTER — Encounter: Payer: Self-pay | Admitting: Cardiology

## 2011-03-16 NOTE — Progress Notes (Signed)
Felicia Bottoms, MD, [facility] Board Certified in Adult Cardiovascular Medicine,Internal Medicine and Critical Care Medicine    CC: Followup patient with mitral stenosis  HPI:  The patient is a 77 year old female who several months ago had a complicated hospital course she was found to have profound anemia. She had a hemoglobin level of 5.8 and a ferritin of 3. 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 She's been referred to [facility] for definitive therapy. She states that since April she has required 9 units of blood. Interestingly however she's currently not taking any iron supplements. Also the patient consumes quite a bit of coffee and eats raisin bran all which could lead to decreased iron absorption. She had a recent echocardiogram to followup her mitral stenosis. She has a family history in her sister of rheumatic heart disease. However her recent echocardiogram does not confirm that the patient has dramatically disease. Although she has moderate mitral stenosis this appears to be secondary to fibrocalcific changes of the mitral valve. The mean gradient is 7 mm of mercury. The continuity equation the valve area was 1.93 cm and 1.7 cm by pressure half time. She states that when she is not anemic however she's not particularly short of breath has no chest pain orthopnea or PND. Her ejection fraction by echo is also greater than 70%. She was noted to have significant coronary calcifications by CT scan but has not had a cardiac catheterization or further ischemia workup. Nevertheless from a cardiac standpoint she seems to be doing well and is awaiting further definitive therapy for her chronic anemia and blood loss.     PMH: reviewed and listed in Problem List in Electronic Records (and see below) Past Medical History  Diagnosis Date  . CHF (congestive heart failure)     2D echo  showed mild LVH and  hyperdynamic Left  ventricle with EF > 65%  . Pulmonary hypertension, mild   . Pericardial effusion     small pericardial effusion without evidence of amponade  . Hypertension   . Anemia     Admitted with severe anemia hemoglobin 5.8 colonoscopy pending  . Rheumatic heart disease      mild mitral stenosis and mild mitral regurgitation no personal history of rheumatic fever but history of rheumatic fever and sister   . Subendocardial infarction     In the setting of severe anemia. Mild troponin leak  . Carotid artery disease     Bilateral 70% ICA stenosis, patient has already had evaluation by Dr. Durwin Nora. Not sure a followup study was scheduled.    Past Surgical History  Procedure Date  . Abdominal hysterectomy   . Breast lumpectomy   . Givens capsule study 01/02/2011    Procedure: GIVENS CAPSULE STUDY;  Surgeon: Malissa Hippo, MD;  Location: [facility]ITE;  Service: Endoscopy;  Laterality: N/A;  7:30 am     Allergies/SH/FHX : available in Electronic Records for review and reviewed today with a history of rheumatic fever in her sister  Medications: Current Outpatient Prescriptions  Medication Sig Dispense Refill  . albuterol (PROVENTIL HFA;VENTOLIN HFA) 108 (90 BASE) MCG/ACT inhaler Inhale 2 puffs into the lungs every 6 (six) hours as needed. For shortness of breath      . Calcium Carbonate-Vitamin D (CALTRATE 600+D) 600-400 MG-UNIT per tablet Take 1 tablet by mouth 2 (two) times daily.        Marland Kitchen  furosemide (LASIX) 20 MG tablet Take 20 mg by mouth daily.        . NON FORMULARY CPAP with oxygen at night only       . pantoprazole (PROTONIX) 40 MG tablet Take 40 mg by mouth daily.        . ferrous sulfate (FERROUSUL) 325 (65 FE) MG tablet Take 1 tablet (325 mg total) by mouth 3 (three) times daily with meals.  90 tablet  6    ROS: No nausea or vomiting. No fever or chills.No melena or hematochezia.No bleeding.No claudication. No presyncope or syncope  Physical  Exam: BP 136/58  Pulse 82  Ht 5' (1.524 m)  Wt 143 lb (64.864 kg)  BMI 27.93 kg/m2  SpO2 96% General: Well-nourished white female in no apparent distress Neck: Normal carotid upstroke with no carotid bruits. No thyromegaly nonnodular thyroid. JVP is approximately 5-6 cm. Lungs: Clear breath sounds bilaterally without wheezing Cardiac: Regular rate and rhythm with normal S1-S2 and no murmur rubs or gallops. I cannot hear a definite  diastolic murmur  Vascular: No edema. Normal distal pulses bilaterally Skin: Warm and dry  12lead ECG: Not obtained. Limited bedside ECHO:N/A   Assessment and Plan  Counseling was provided regarding the current medical condition and included: . Diagnosis, impressions, prognosis, recommended diagnostic studies  . Risks and benefits of treatment options  . Instructions for management, treatment and/or follow-up care  . Importance of compliance with treatment, risk factor reduction  . Patient and/or family education    Time spent counseling was 45 minutes and recorded in the Problem List.

## 2011-03-16 NOTE — Assessment & Plan Note (Signed)
This occurred in the setting of profound anemia. Eventually the patient will need an ischemia workup but the immediate plans are for her to maintain her hemoglobin above 10 and have her bleeding source taken care of first. At a later date we can potentially proceed with cardiac catheterization because of the patient needs an intervention she will need dual antiplatelet therapy.

## 2011-03-16 NOTE — Assessment & Plan Note (Signed)
We will obtain followup carotid Dopplers in 6 months given her significant bilateral carotid artery disease.

## 2011-03-16 NOTE — Assessment & Plan Note (Signed)
We will need to follow her degree of mitral stenosis in the future and recommend an echocardiogram every 6 months. Mitral stenosis appears to be more in the moderate range currently and she does not appear to be symptomatic with this. I told the patient that if the valve area becomes less than 1.5 cm further evaluation may be necessary. This may include a cardiac catheterization as the patient has multiple risk factors and known coronary calcifications. Of note however is that morphologically after reviewing the echocardiogram her mitral stenosis does not appear to be rheumatic in nature but rather secondary to fibrocalcific changes of the mitral valve. Her ejection fraction is well preserved.

## 2011-03-16 NOTE — Assessment & Plan Note (Signed)
The patient has required 9 units of blood since April. It appears that her bleeding sore has been intensified and she is waiting for definitive therapy to be performed likely with enteroscopy at [facility]. She appears that AVMs in the small intestine. However the patient is not taking iron supplements which I think she could greatly benefit from. I still suspect her eyes towards her very low and I've ordered a ferritin iron and iron saturation. I also spent considerable time with the patient regarding dietary modifications that she can take to increase her iron absorption I gave her the following recommendations: HEME iron is found only in meat, fish and poultry and is absorbed much more easily than NON-HEME iron, which is found primarily in fruits, vegetables, dried beans, nuts and grain products. The following factors will increase the iron absorption from non-heme foods: . A good source of vitamin C (ascorbic acid) - i.e., oranges, grapefruits, tomatoes, broccoli and strawberries, eaten with a NON-HEME food . A HEME and NON-HEME food eaten together . A NON-HEME food cooked in an iron pot, such as a cast iron skillet The following factors will decrease non-heme iron absorption: . Large amounts of tea or coffee consumed with a meal (the polyphenols bind the iron). . Excess consumption of high fiber foods or bran supplements (the phytates in such foods inhibit absorption). . High intake of calcium - take your calcium supplement at a different time from your iron supplement.

## 2011-05-16 ENCOUNTER — Telehealth (INDEPENDENT_AMBULATORY_CARE_PROVIDER_SITE_OTHER): Payer: Self-pay | Admitting: *Deleted

## 2011-05-16 NOTE — Telephone Encounter (Signed)
Ms Olheiser left mess, on my VM stating she needed a new RX since her prescription coverage changed, she mentioned something about Pantoprazole 40 mg and what her insurance what substitute, please call

## 2011-05-16 NOTE — Telephone Encounter (Signed)
I called the patient's insurance company to do PA for the pantoprazole. They are going to review and she if they can approve this with the diagnosis that were provided for the patient. If they cannot , the patient will need tot ry  Nexium, Dexilant or Omeprazole as they are the preferred PPI. They will contact our office with a answer.  Patient called and made aware.

## 2011-05-17 ENCOUNTER — Telehealth (INDEPENDENT_AMBULATORY_CARE_PROVIDER_SITE_OTHER): Payer: Self-pay | Admitting: *Deleted

## 2011-05-17 NOTE — Telephone Encounter (Signed)
I called the patient and left a message that [employer] denied the Pantoprazole 40 mg. She will need to try one of the three that is listed on their formulary: Nexium, Omeprazole, Dexilant. Per Dr. Karilyn Cota the patient may try the Dexilant 60 mg take 1 by mouth 30 minutes prior to breakfast , if this works , no break through symptoms, he will write her a prescription. Patient advised that our office closed today at 12 pm and reopen at normal hours Monday, and her medication,(samples), was at the front desk.

## 2011-06-19 ENCOUNTER — Telehealth (INDEPENDENT_AMBULATORY_CARE_PROVIDER_SITE_OTHER): Payer: Self-pay | Admitting: *Deleted

## 2011-06-19 DIAGNOSIS — D509 Iron deficiency anemia, unspecified: Secondary | ICD-10-CM

## 2011-06-19 NOTE — Telephone Encounter (Signed)
Per Dr. Karilyn Cota the patient will need H/H in 4 weeks, labs noted and the patient was notified. She states that she wants to go to [facility] to have all future work done.

## 2011-06-19 NOTE — Telephone Encounter (Signed)
Patient had been given samples of Dexilant from office to see if they would work. They did ans she called the office asking that we call in her a prescription to [facility]. Per Delrae Rend the following was called to Pharmacy: Dexilant 60 mg patient to take 1 by mouth every morning #30 with 11 refills. This was given to Florentina Addison at [facility]. Patient made aware. She was also made aware that per Dr. Karilyn Cota after reviewing notes from Dr. Gwinda Passe she will need a H/H in 4 weeks.

## 2011-06-21 ENCOUNTER — Encounter (INDEPENDENT_AMBULATORY_CARE_PROVIDER_SITE_OTHER): Payer: Self-pay | Admitting: *Deleted

## 2011-07-17 ENCOUNTER — Other Ambulatory Visit (INDEPENDENT_AMBULATORY_CARE_PROVIDER_SITE_OTHER): Payer: Self-pay | Admitting: Internal Medicine

## 2011-07-18 LAB — HEMOGLOBIN AND HEMATOCRIT, BLOOD
HCT: 44.9 % (ref 36.0–46.0)
Hemoglobin: 14.5 g/dL (ref 12.0–15.0)

## 2011-08-08 ENCOUNTER — Other Ambulatory Visit: Payer: Self-pay | Admitting: Cardiology

## 2011-08-08 DIAGNOSIS — I059 Rheumatic mitral valve disease, unspecified: Secondary | ICD-10-CM

## 2011-08-29 ENCOUNTER — Other Ambulatory Visit: Payer: Self-pay | Admitting: *Deleted

## 2011-08-29 ENCOUNTER — Other Ambulatory Visit (INDEPENDENT_AMBULATORY_CARE_PROVIDER_SITE_OTHER): Payer: Medicare Other

## 2011-08-29 DIAGNOSIS — I059 Rheumatic mitral valve disease, unspecified: Secondary | ICD-10-CM

## 2011-08-29 DIAGNOSIS — I359 Nonrheumatic aortic valve disorder, unspecified: Secondary | ICD-10-CM

## 2011-09-05 ENCOUNTER — Telehealth: Payer: Self-pay | Admitting: Cardiology

## 2011-09-05 NOTE — Telephone Encounter (Signed)
Should she still be taking the baby aspirin?  She is also still waiting on return call from [facility]

## 2011-09-09 NOTE — Telephone Encounter (Signed)
States that Dr. Karilyn Cota had initially taken her off due to some bleeding issues.  Saw her PMD last week and he advised her to restart.  Advised her to follow the advice of her PMD for now & can discuss further with GD at OV on 6/21.  She verbalized understanding.

## 2011-09-09 NOTE — Telephone Encounter (Signed)
Left message to return call 

## 2011-09-20 ENCOUNTER — Encounter: Payer: Self-pay | Admitting: Cardiology

## 2011-09-20 ENCOUNTER — Ambulatory Visit (INDEPENDENT_AMBULATORY_CARE_PROVIDER_SITE_OTHER): Payer: Medicare Other | Admitting: Cardiology

## 2011-09-20 ENCOUNTER — Encounter: Payer: Self-pay | Admitting: *Deleted

## 2011-09-20 VITALS — BP 146/76 | HR 75 | Resp 18 | Ht 59.0 in | Wt 142.0 lb

## 2011-09-20 DIAGNOSIS — H34219 Partial retinal artery occlusion, unspecified eye: Secondary | ICD-10-CM

## 2011-09-20 DIAGNOSIS — H34212 Partial retinal artery occlusion, left eye: Secondary | ICD-10-CM

## 2011-09-20 DIAGNOSIS — E119 Type 2 diabetes mellitus without complications: Secondary | ICD-10-CM

## 2011-09-20 DIAGNOSIS — G4733 Obstructive sleep apnea (adult) (pediatric): Secondary | ICD-10-CM

## 2011-09-20 DIAGNOSIS — I779 Disorder of arteries and arterioles, unspecified: Secondary | ICD-10-CM

## 2011-09-20 DIAGNOSIS — I059 Rheumatic mitral valve disease, unspecified: Secondary | ICD-10-CM

## 2011-09-20 DIAGNOSIS — D509 Iron deficiency anemia, unspecified: Secondary | ICD-10-CM

## 2011-09-20 DIAGNOSIS — E7800 Pure hypercholesterolemia, unspecified: Secondary | ICD-10-CM

## 2011-09-20 DIAGNOSIS — I214 Non-ST elevation (NSTEMI) myocardial infarction: Secondary | ICD-10-CM

## 2011-09-20 DIAGNOSIS — I099 Rheumatic heart disease, unspecified: Secondary | ICD-10-CM

## 2011-09-20 DIAGNOSIS — I2510 Atherosclerotic heart disease of native coronary artery without angina pectoris: Secondary | ICD-10-CM

## 2011-09-20 NOTE — Patient Instructions (Addendum)
   TEE (transesophageal echocardiogram) - will call with date & time  Labs:  HgA1C, lipids, CMET - Reminder:  Nothing to eat or drink after 12 midnight prior to labs.  Carotid Dopplers  Our office will contact with results   Follow up will be determined after TEE

## 2011-09-26 ENCOUNTER — Other Ambulatory Visit: Payer: Self-pay | Admitting: *Deleted

## 2011-09-26 DIAGNOSIS — I059 Rheumatic mitral valve disease, unspecified: Secondary | ICD-10-CM

## 2011-09-27 ENCOUNTER — Telehealth: Payer: Self-pay | Admitting: Cardiology

## 2011-09-27 NOTE — Telephone Encounter (Signed)
Pt has Medicare and [employer] Medicare Supplement.  No precert required.

## 2011-09-27 NOTE — Telephone Encounter (Signed)
Patient scheduled for TEE for Monday, July 1 at 9:30 - GD.

## 2011-09-29 DIAGNOSIS — H34212 Partial retinal artery occlusion, left eye: Secondary | ICD-10-CM | POA: Insufficient documentation

## 2011-09-29 NOTE — Assessment & Plan Note (Signed)
In the setting of severe ischemia.  No evidence of significant coronary artery disease at present although further evaluation may be indicated later time.

## 2011-09-29 NOTE — Assessment & Plan Note (Signed)
Patient now on CPAP.

## 2011-09-29 NOTE — Progress Notes (Signed)
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  Aspirin has been restarted in the meanwhile.  She also has carotid artery disease. 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  I explained to the patient that this represent cholesterol emboli likely coming from the aortic arch or ascending aorta.  She had no interventional procedure prior to this finding.  She also has a possible history of rheumatic heart disease and has known mitral stenosis and moderate mitral regurgitation.  She also has obstructive sleep apnea and uses O2 at night.  She also been diagnosed with COPD. The patient has not been referred by ophthalmology to further address the findings of Hollenhorst plaque. Patient denies any chest pain orthopnea PND she has no palpitations or syncope.      Otherwise she is stable from a cardiovascular perspective.  PMH: reviewed and listed in Problem List in Electronic Records (and see below) Past Medical History  Diagnosis Date  . CHF (congestive heart failure)     2D echo showed mild LVH and  hyperdynamic Left  ventricle with EF > 65%  . Pulmonary hypertension, mild   . Pericardial effusion     small pericardial effusion without evidence of amponade  . Hypertension   . Anemia     Admitted with severe anemia hemoglobin 5.8 colonoscopy pending  . Rheumatic heart disease      mild mitral stenosis and mild mitral regurgitation no personal history of rheumatic fever but history of rheumatic fever and sister   .  Subendocardial infarction     In the setting of severe anemia. Mild troponin leak  . Carotid artery disease     Bilateral 70% ICA stenosis  . COPD (chronic obstructive pulmonary disease)   . Sleep apnea    Past Surgical History  Procedure Date  . Abdominal hysterectomy   . Breast lumpectomy   . Givens capsule study 01/02/2011    Procedure: GIVENS CAPSULE STUDY;  Surgeon: Malissa Hippo, MD;  [facility];  Service: Endoscopy;  Laterality: N/A;  7:30 am    Allergies/SH/FHX : available in Electronic Records for review  Allergies  Allergen Reactions  . Penicillins Itching and Rash   History   Social History  . Marital Status: Widowed    Spouse Name: N/A    Number of Children: N/A  . Years of Education: N/A   Occupational History  . Not on file.   Social History Main Topics  . Smoking status: Former Smoker -- 0.5 packs/day for 55 years    Types: Cigarettes    Quit date: 07/17/2010  . Smokeless tobacco: Never Used  . Alcohol Use:  No  . Drug Use: No  . Sexually Active: Not on file   Other Topics Concern  . Not on file   Social History Narrative   WidowLives alone/currently staying at [facility] has a very close friend who she speaks with on a regular basis whom is very involved in her life   Family History  Problem Relation Age of Onset  . Colon cancer Father     died age 36  . Healthy Brother   . Heart disease Sister     Medications: Current Outpatient Prescriptions  Medication Sig Dispense Refill  . aspirin 81 MG tablet Take 81 mg by mouth daily.      . Calcium Carbonate-Vitamin D (CALTRATE 600+D) 600-400 MG-UNIT per tablet Take 1 tablet by mouth 2 (two) times daily.        Marland Kitchen dexlansoprazole (DEXILANT) 60 MG capsule Take 60 mg by mouth daily.      . ferrous sulfate (FERROUSUL) 325 (65 FE) MG tablet Take 1 tablet (325 mg total) by mouth 3 (three) times daily with meals.  90 tablet  6  . furosemide (LASIX) 20 MG tablet Take 20 mg by  mouth daily.        . Multiple Vitamins-Minerals (MULTIVITAMIN PO) Take by mouth.      . NON FORMULARY CPAP with oxygen at night only         ROS: No nausea or vomiting. No fever or chills.No melena or hematochezia.No bleeding.No claudication  Physical Exam: BP 146/76  Pulse 75  Resp 18  Ht 4\' 11"  (1.499 m)  Wt 142 lb (64.411 kg)  BMI 28.68 kg/m2 General:well-nourished white female in no distress. HEENT: Pupils isocoric conjunctiva clear oropharynx within normal limits. Neck:normal carotid upstroke and left carotid bruit.  No thyromegaly lung nodule of thyroid Lungs:clear breath sounds bilaterally no wheezing Cardiac:regular rate and rhythm with 3/6 holosystolic murmur at the apex and normal S1 and S2.  No S3 Vascular:no edema.  Normal distal pulses Skin:warm and dry Physcologic:normal affect.  None obtained Neurological: Alert and our testing grossly nonfocal Lymphatics: Within normal limits  12lead ECG:not obtained. Limited bedside ECHO:N/A No images are attached to the encounter.   I reviewed and summarized the old records. I reviewed ECG and prior blood work.  Assessment and Plan  Carotid artery disease Patient will be scheduled for carotid Dopplers.  Particularly in light of her Hollenhorst plaque.short of this finding neurologically she has been asymptomatic  Hollenhorst plaque, left eye Suggestive of cholesterol emboli possibly coming from the aortic arch.  Will evaluate further with a TEE also to make sure that the patient does not have a penetrating ulcer.  Iron deficiency anemia Followed closely by hematology and this has improved.  The patient eventually went to [facility] and was found to have a bleeding source with AVMs in the small intestine were cauterized.  Obstructive sleep apnea Patient now on CPAP.  Rheumatic heart disease Moderate mitral regurgitation and mild mitral stenosis.only further evaluated by TEE  Subendocardial infarction In the  setting of severe ischemia.  No evidence of significant coronary artery disease at present although further evaluation may be indicated later time.  Pulmonary hypertension PA systolic pressure 55 mmHg by echocardiogram 08/29/2011 with moderate mitral regurgitation and mean gradient of 5 mmHg.  Grade 1 diastolic dysfunction  Risks of the Procedure Possible risks associated with a transesophageal echocardiogram include, but are not limited to, the following:   breathing problems   heart rhythm problems  infection of the heart valves   bleeding of the esophagus Patients with known problems of the esophagus, such as esophageal varices, esophageal obstruction, or radiation therapy to the area of the esophagus should be evaluated carefully by the physician before having the procedure. Patients who are allergic to or sensitive to medications or latex should notify their physician. If you are pregnant or suspect that you may be pregnant, you should notify your physician. There may be other risks depending upon your specific medical condition. Be sure to discuss any concerns with your physician prior to the procedure.  Counseling was provided regarding the current medical condition and included: . Diagnosis, impressions, prognosis, recommended diagnostic studies  . Risks and benefits of treatment options  . Instructions for management, treatment and/or follow-up care  . Importance of compliance with treatment, risk factor reduction  . Patient and/or family education    Time spent counseling was 30 minutes and recorded in the Problem List.   Patient Active Problem List  Diagnosis  . Subendocardial infarction  . Carotid artery disease  . Iron deficiency anemia  . Rheumatic heart disease  . Obstructive sleep apnea  . Hollenhorst plaque, left eye

## 2011-09-29 NOTE — Assessment & Plan Note (Addendum)
Moderate mitral regurgitation and mild mitral stenosis.only further evaluated by TEE

## 2011-09-29 NOTE — Assessment & Plan Note (Signed)
Followed closely by hematology and this has improved.  The patient eventually went [facility] and was found to have a bleeding source with AVMs in the small intestine were cauterized.

## 2011-09-29 NOTE — Assessment & Plan Note (Signed)
Patient will be scheduled for carotid Dopplers.  Particularly in light of her Hollenhorst plaque.short of this finding neurologically she has been asymptomatic

## 2011-09-29 NOTE — Assessment & Plan Note (Signed)
Suggestive of cholesterol emboli possibly coming from the aortic arch.  Will evaluate further with a TEE also to make sure that the patient does not have a penetrating ulcer.

## 2011-09-30 DIAGNOSIS — I059 Rheumatic mitral valve disease, unspecified: Secondary | ICD-10-CM

## 2011-10-02 ENCOUNTER — Telehealth: Payer: Self-pay | Admitting: *Deleted

## 2011-10-02 MED ORDER — ATORVASTATIN CALCIUM 20 MG PO TABS: 20.0000 mg | ORAL_TABLET | Freq: Every evening | ORAL | Status: DC

## 2011-10-02 NOTE — Telephone Encounter (Signed)
Message copied by Lesle Chris on Wed Oct 02, 2011  4:58 PM ------      Message from: Learta Codding      Created: Tue Oct 01, 2011 10:47 AM       Recommend lipitor 20 mg PO qdaily.

## 2011-10-02 NOTE — Telephone Encounter (Signed)
Notes Recorded by Lesle Chris, LPN on 11/02/6960 at 4:57 PM Patient notified and verbalized understanding. Will send new rx to [facility] at pt request.  Will also forward copy to PMD (Tapper).

## 2011-10-09 ENCOUNTER — Encounter (INDEPENDENT_AMBULATORY_CARE_PROVIDER_SITE_OTHER): Payer: Medicare Other

## 2011-10-09 DIAGNOSIS — I6529 Occlusion and stenosis of unspecified carotid artery: Secondary | ICD-10-CM

## 2011-10-09 DIAGNOSIS — R0989 Other specified symptoms and signs involving the circulatory and respiratory systems: Secondary | ICD-10-CM

## 2011-10-09 DIAGNOSIS — I2510 Atherosclerotic heart disease of native coronary artery without angina pectoris: Secondary | ICD-10-CM

## 2011-10-09 DIAGNOSIS — H34219 Partial retinal artery occlusion, unspecified eye: Secondary | ICD-10-CM

## 2011-10-14 ENCOUNTER — Telehealth: Payer: Self-pay | Admitting: *Deleted

## 2011-10-14 NOTE — Telephone Encounter (Signed)
Notes Recorded by Lesle Chris, LPN on 1/61/0960 at 10:29 AM Patient notified and verbalized understanding.

## 2011-10-14 NOTE — Telephone Encounter (Signed)
Message copied by Lesle Chris on Mon Oct 14, 2011 10:30 AM ------      Message from: Rande Brunt      Created: Fri Oct 11, 2011 12:01 PM       60-79&% R ICA stenosis. Recommended repeat study inn 6 months.

## 2011-10-15 ENCOUNTER — Ambulatory Visit: Payer: Medicare Other | Admitting: Cardiology

## 2011-10-22 ENCOUNTER — Telehealth: Payer: Self-pay | Admitting: *Deleted

## 2011-10-22 NOTE — Telephone Encounter (Signed)
Mrs. Drennan called and wanted to know if we have the results of her TEE that was done on September 30, 2011.

## 2011-10-23 NOTE — Telephone Encounter (Signed)
Left message to return call 

## 2011-10-24 NOTE — Telephone Encounter (Signed)
Informed patient that I do not have the results available as GD is currently out of the office.  Informed him that he usually speaks with family day of procedure.  States she will speak with family members again.  Advised her that as soon as we get update on GD upcoming schedule, we will notify patients of their rescheduled appointments.  She verbalized understanding.

## 2011-11-22 ENCOUNTER — Encounter: Payer: Self-pay | Admitting: Cardiology

## 2011-11-22 DIAGNOSIS — R42 Dizziness and giddiness: Secondary | ICD-10-CM

## 2011-11-25 ENCOUNTER — Encounter: Payer: Self-pay | Admitting: Cardiology

## 2011-11-25 ENCOUNTER — Ambulatory Visit (INDEPENDENT_AMBULATORY_CARE_PROVIDER_SITE_OTHER): Payer: Medicare Other | Admitting: Cardiology

## 2011-11-25 VITALS — BP 191/78 | HR 73 | Ht 60.0 in | Wt 138.0 lb

## 2011-11-25 DIAGNOSIS — R42 Dizziness and giddiness: Secondary | ICD-10-CM

## 2011-11-25 DIAGNOSIS — I099 Rheumatic heart disease, unspecified: Secondary | ICD-10-CM

## 2011-11-25 DIAGNOSIS — M791 Myalgia, unspecified site: Secondary | ICD-10-CM

## 2011-11-25 DIAGNOSIS — H34212 Partial retinal artery occlusion, left eye: Secondary | ICD-10-CM

## 2011-11-25 DIAGNOSIS — D509 Iron deficiency anemia, unspecified: Secondary | ICD-10-CM

## 2011-11-25 DIAGNOSIS — I214 Non-ST elevation (NSTEMI) myocardial infarction: Secondary | ICD-10-CM

## 2011-11-25 DIAGNOSIS — I10 Essential (primary) hypertension: Secondary | ICD-10-CM

## 2011-11-25 DIAGNOSIS — IMO0001 Reserved for inherently not codable concepts without codable children: Secondary | ICD-10-CM

## 2011-11-25 DIAGNOSIS — I779 Disorder of arteries and arterioles, unspecified: Secondary | ICD-10-CM

## 2011-11-25 DIAGNOSIS — H34219 Partial retinal artery occlusion, unspecified eye: Secondary | ICD-10-CM

## 2011-11-25 DIAGNOSIS — G458 Other transient cerebral ischemic attacks and related syndromes: Secondary | ICD-10-CM

## 2011-11-25 NOTE — Assessment & Plan Note (Signed)
Severely elevated systolic blood pressure in both arms and not entirely sure this is related to whitecoat hypertension. We will monitor this closely and depending on the results and the possibility of subclavian stenosis patient may require further treatment. We'll decide after CTA of the neck.

## 2011-11-25 NOTE — Assessment & Plan Note (Signed)
Patient has significant dizziness and vertiginous feelings with weakness and faintness. She has bilateral subclavian artery bruits. She will need an evaluation with CTA of the neck. With Doppler she had very harsh sonographic sounds possibly consistent with subclavian artery stenosis. Patient has a palpable arterial pulse in both hands. However is possible another center pressure is even higher.

## 2011-11-25 NOTE — Assessment & Plan Note (Signed)
Continue current medical therapy 

## 2011-11-25 NOTE — Assessment & Plan Note (Addendum)
Likely secondary to atherosclerotic disease in the transverse aorta. Patient is on aspirin. Plavix can not be used secondary to relative contraindication from multiple AVMs and prior history of severe GI bleeding and chronic anemia. Patient has followup with ophthalmologist in the next couple of weeks

## 2011-11-25 NOTE — Patient Instructions (Addendum)
   CTA of upper chest & neck   Office will contact with results Follow up in  3 months

## 2011-11-25 NOTE — Assessment & Plan Note (Signed)
Type II non-ST elevation myocardial infarction in the setting of significant anemia

## 2011-11-25 NOTE — Assessment & Plan Note (Signed)
Patient will need followup carotid Dopplers she has significant disease of the right internal carotid artery. Overall the patient has severe atherosclerotic disease.

## 2011-11-25 NOTE — Assessment & Plan Note (Signed)
Patient also Lipitor for now. We'll make decision if she can tolerate another statin after further evaluation we may consider Crestor.

## 2011-11-25 NOTE — Progress Notes (Signed)
Felicia Bottoms, MD, [facility] ABIM Board Certified in Adult Cardiovascular Medicine,Internal Medicine and Critical Care Medicine    CC: Followup patient after recent TEE for Hollenhorst plaques.                                                                                 HPI:        Patient has significant atherosclerotic disease of the transverse aorta. This is likely the cause of her Hollenhorst plaques although she also has significant carotid artery disease. The patient is on aspirin however we've been reluctant to use in addition Plavix because of her history of multiple AVMs severe anemia and prior GI bleeding. In addition the patient also has rheumatic valvular heart disease with mild mitral stenosis and moderate mitral regurgitation however the latter does not appear to be an indication for surgery at the present time. Patient was recently seen in the emergency room and it was felt that she had dizziness and faintness with possibly vertiginous feelings secondary to Lipitor which seems distinctly unlikely. She did have muscle aches and pains which could be related to Lipitor. The patient has now stopped Lipitor for several days but continues to have symptoms of what she describes as vertigo when she turns her head to a particular side. Differential diagnosis includes vertebral artery insufficiency, benign positional vertigo subclavian steal syndrome. Today she was found to have significant bilateral subclavian bruits. She denies any chest pain orthopnea PND palpitations or syncope. She had a type II non-ST elevation microinfarction in the setting of severe anemia but denies any current chest pain.  PMH: reviewed and listed in Problem List in Electronic Records (and see below) Past Medical History  Diagnosis Date  . CHF (congestive heart failure)     2D echo showed mild LVH and  hyperdynamic Left  ventricle with EF > 65%  . Pulmonary hypertension, mild   . Pericardial effusion     small  pericardial effusion without evidence of amponade  . Hypertension   . Anemia     Admitted with severe anemia hemoglobin 5.8 colonoscopy pending  . Rheumatic heart disease      mild mitral stenosis and mild mitral regurgitation no personal history of rheumatic fever but history of rheumatic fever and sister   . Subendocardial infarction     In the setting of severe anemia. Mild troponin leak  . Carotid artery disease     Bilateral 70% ICA stenosis  . COPD (chronic obstructive pulmonary disease)   . Sleep apnea    Past Surgical History  Procedure Date  . Abdominal hysterectomy   . Breast lumpectomy   . Givens capsule study 01/02/2011    Procedure: GIVENS CAPSULE STUDY;  Surgeon: Malissa Hippo, MD;  Location: [facility];  Service: Endoscopy;  Laterality: N/A;  7:30 am    Allergies/SH/FHX : available in Electronic Records for review  Allergies  Allergen Reactions  . Penicillins Itching and Rash   History   Social History  . Marital Status: Widowed    Spouse Name: N/A    Number of Children: N/A  . Years of Education: N/A   Occupational History  . Not on  file.   Social History Main Topics  . Smoking status: Former Smoker -- 0.5 packs/day for 55 years    Types: Cigarettes    Quit date: 07/17/2010  . Smokeless tobacco: Never Used  . Alcohol Use: No  . Drug Use: No  . Sexually Active: Not on file   Other Topics Concern  . Not on file   Social History Narrative   WidowLives alone/currently staying at [facility] has a very close friend who she speaks with on a regular basis whom is very involved in her life   Family History  Problem Relation Age of Onset  . Colon cancer Father     died age 53  . Healthy Brother   . Heart disease Sister     Medications: Current Outpatient Prescriptions  Medication Sig Dispense Refill  . aspirin 81 MG tablet Take 81 mg by mouth daily.      . Calcium Carbonate-Vitamin D (CALTRATE 600+D) 600-400 MG-UNIT per tablet  Take 1 tablet by mouth 2 (two) times daily.        . Cholecalciferol (VITAMIN D3) 1000 UNITS CAPS Take by mouth. occassionallly      . dexlansoprazole (DEXILANT) 60 MG capsule Take 60 mg by mouth daily.      . ferrous sulfate 325 (65 FE) MG tablet 1 time a day      . furosemide (LASIX) 20 MG tablet Take 20 mg by mouth daily.        . Multiple Vitamins-Minerals (MULTIVITAMIN PO) Take by mouth.      . NON FORMULARY CPAP with oxygen at night only       . DISCONTD: ferrous sulfate (FERROUSUL) 325 (65 FE) MG tablet Take 1 tablet (325 mg total) by mouth 3 (three) times daily with meals.  90 tablet  6    ROS: No nausea or vomiting. No fever or chills.No melena or hematochezia.No bleeding.No claudication  Physical Exam: BP 191/78  Pulse 73  Ht 5' (1.524 m)  Wt 138 lb (62.596 kg)  BMI 26.95 kg/m2 General: Well-nourished white female in no distress. Neck: Soft carotid bruit right side. Normal carotid upstroke bilaterally. Bilateral subclavian bruits. JVP 6-7 cm Lungs: Clear breath sounds bilaterally without wheezing Cardiac: Regular rate and rhythm with normal S1-S2 2/6 holosystolic murmur at the apex. Vascular: No edema. Normal distal pulses Skin: Warm and dry Physcologic: Normal affect  12lead ECG: Not performed Limited bedside ECHO:N/A No images are attached to the encounter.   I reviewed and summarized the old records. I reviewed ECG and prior blood work.  Assessment and Plan  Hollenhorst plaque, left eye Likely secondary to atherosclerotic disease in the transverse aorta. Patient is on aspirin. Plavix can not be used secondary to relative contraindication from multiple AVMs and prior history of severe GI bleeding and chronic anemia. Patient has followup with ophthalmologist in the next couple of weeks  Carotid artery disease Patient will need followup carotid Dopplers she has significant disease of the right internal carotid artery. Overall the patient has severe atherosclerotic  disease.  Rheumatic heart disease Continue current medical therapy.  Subendocardial infarction Type II non-ST elevation myocardial infarction in the setting of significant anemia  White coat hypertension Severely elevated systolic blood pressure in both arms and not entirely sure this is related to whitecoat hypertension. We will monitor this closely and depending on the results and the possibility of subclavian stenosis patient may require further treatment. We'll decide after CTA of the neck.  Subclavian steal syndrome  Patient has significant dizziness and vertiginous feelings with weakness and faintness. She has bilateral subclavian artery bruits. She will need an evaluation with CTA of the neck. With Doppler she had very harsh sonographic sounds possibly consistent with subclavian artery stenosis. Patient has a palpable arterial pulse in both hands. However is possible another center pressure is even higher.  Muscle pain Patient also Lipitor for now. We'll make decision if she can tolerate another statin after further evaluation we may consider Crestor.    Patient Active Problem List  Diagnosis  . Subendocardial infarction  . Carotid artery disease  . Iron deficiency anemia  . Rheumatic heart disease  . Obstructive sleep apnea  . Hollenhorst plaque, left eye  . White coat hypertension  . Subclavian steal syndrome  . Muscle pain

## 2011-11-26 ENCOUNTER — Telehealth: Payer: Self-pay

## 2011-11-26 ENCOUNTER — Other Ambulatory Visit: Payer: Self-pay | Admitting: Cardiology

## 2011-11-26 DIAGNOSIS — R42 Dizziness and giddiness: Secondary | ICD-10-CM

## 2011-11-26 NOTE — Telephone Encounter (Signed)
No precert required. Medicare & [employer]

## 2011-11-26 NOTE — Telephone Encounter (Signed)
CT Angio Neck W/Cm &/Or Wo/Cm scheduled for 11-27-2011 [facility]

## 2011-12-03 ENCOUNTER — Telehealth: Payer: Self-pay | Admitting: *Deleted

## 2011-12-03 NOTE — Telephone Encounter (Signed)
Patient called to inquire about test results for CT scan done last week.  Advised her that test results are on MD desk top & as soon as he reviews, will let her know.  Also, c/o having another fall, but did not go to ED.  Also, c/o cough, productive with yellow phlegm.  Can hear patient coughing over the phone.  Advised her to see PMD for this as she may need antibiotic & to also inform him of her fall in case she needs to be further evaluated.  She verbalized understanding.

## 2011-12-05 ENCOUNTER — Other Ambulatory Visit: Payer: Self-pay

## 2011-12-05 DIAGNOSIS — R569 Unspecified convulsions: Secondary | ICD-10-CM

## 2011-12-10 ENCOUNTER — Other Ambulatory Visit: Payer: Self-pay | Admitting: *Deleted

## 2011-12-10 DIAGNOSIS — R55 Syncope and collapse: Secondary | ICD-10-CM

## 2011-12-12 DIAGNOSIS — R55 Syncope and collapse: Secondary | ICD-10-CM

## 2011-12-17 ENCOUNTER — Other Ambulatory Visit: Payer: Self-pay | Admitting: *Deleted

## 2011-12-17 DIAGNOSIS — I6529 Occlusion and stenosis of unspecified carotid artery: Secondary | ICD-10-CM

## 2011-12-20 ENCOUNTER — Encounter (HOSPITAL_COMMUNITY): Payer: Self-pay | Admitting: Emergency Medicine

## 2011-12-20 ENCOUNTER — Telehealth: Payer: Self-pay | Admitting: Cardiology

## 2011-12-20 ENCOUNTER — Emergency Department (HOSPITAL_COMMUNITY): Payer: Medicare Other

## 2011-12-20 ENCOUNTER — Inpatient Hospital Stay (HOSPITAL_COMMUNITY)
Admission: EM | Admit: 2011-12-20 | Discharge: 2011-12-24 | DRG: 242 | Disposition: A | Discharge status: Home Health | Payer: Medicare Other | Attending: Internal Medicine | Admitting: Internal Medicine

## 2011-12-20 DIAGNOSIS — I5033 Acute on chronic diastolic (congestive) heart failure: Secondary | ICD-10-CM | POA: Diagnosis present

## 2011-12-20 DIAGNOSIS — I442 Atrioventricular block, complete: Secondary | ICD-10-CM

## 2011-12-20 DIAGNOSIS — I10 Essential (primary) hypertension: Secondary | ICD-10-CM | POA: Diagnosis present

## 2011-12-20 DIAGNOSIS — I509 Heart failure, unspecified: Secondary | ICD-10-CM | POA: Diagnosis present

## 2011-12-20 DIAGNOSIS — I052 Rheumatic mitral stenosis with insufficiency: Secondary | ICD-10-CM | POA: Diagnosis present

## 2011-12-20 DIAGNOSIS — G4730 Sleep apnea, unspecified: Secondary | ICD-10-CM | POA: Diagnosis present

## 2011-12-20 DIAGNOSIS — I252 Old myocardial infarction: Secondary | ICD-10-CM

## 2011-12-20 DIAGNOSIS — Z880 Allergy status to penicillin: Secondary | ICD-10-CM

## 2011-12-20 DIAGNOSIS — I6529 Occlusion and stenosis of unspecified carotid artery: Secondary | ICD-10-CM | POA: Diagnosis present

## 2011-12-20 DIAGNOSIS — E871 Hypo-osmolality and hyponatremia: Secondary | ICD-10-CM | POA: Diagnosis present

## 2011-12-20 DIAGNOSIS — I658 Occlusion and stenosis of other precerebral arteries: Secondary | ICD-10-CM | POA: Diagnosis present

## 2011-12-20 DIAGNOSIS — Z87891 Personal history of nicotine dependence: Secondary | ICD-10-CM

## 2011-12-20 DIAGNOSIS — I2789 Other specified pulmonary heart diseases: Secondary | ICD-10-CM | POA: Diagnosis present

## 2011-12-20 DIAGNOSIS — Z8249 Family history of ischemic heart disease and other diseases of the circulatory system: Secondary | ICD-10-CM

## 2011-12-20 DIAGNOSIS — J4489 Other specified chronic obstructive pulmonary disease: Secondary | ICD-10-CM | POA: Diagnosis present

## 2011-12-20 DIAGNOSIS — Z23 Encounter for immunization: Secondary | ICD-10-CM

## 2011-12-20 DIAGNOSIS — J449 Chronic obstructive pulmonary disease, unspecified: Secondary | ICD-10-CM | POA: Diagnosis present

## 2011-12-20 LAB — BASIC METABOLIC PANEL
CO2: 24 mEq/L (ref 19–32)
Calcium: 9.3 mg/dL (ref 8.4–10.5)
Creatinine, Ser: 0.93 mg/dL (ref 0.50–1.10)
Glucose, Bld: 146 mg/dL — ABNORMAL HIGH (ref 70–99)

## 2011-12-20 LAB — CBC
Hemoglobin: 12.5 g/dL (ref 12.0–15.0)
MCH: 29.5 pg (ref 26.0–34.0)
MCV: 88 fL (ref 78.0–100.0)
RBC: 4.24 MIL/uL (ref 3.87–5.11)

## 2011-12-20 LAB — POCT I-STAT TROPONIN I: Troponin i, poc: 0.06 ng/mL (ref 0.00–0.08)

## 2011-12-20 NOTE — Telephone Encounter (Signed)
Returned call to Ms. Holeman's nephew, Leonette Most. Reviewed previous telephone note from Dr. Antoine Poche. Patient called back and after giving it some thought decided to come to the hospital to be admitted. I instructed her to go to the [facility] ER.  [place], PA-C 12/20/2011 6:54 PM

## 2011-12-20 NOTE — ED Notes (Signed)
Charge RN notified of HR 39. Pt straight to treatment room.

## 2011-12-20 NOTE — ED Notes (Signed)
Patient currently sitting up in bed; no respiratory or acute distress noted.  Patient updated on plan of care; informed paitent that EDP has made consult to cardiology.  Patient has no other questions or concerns at this time.  Rhythm strip from [employer] printed for Dr. Silverio Lay.  Pacer pads placed on patient per verbal order by Dr. Silverio Lay.  Patient denies any pain at this time.  Will continue to monitor.

## 2011-12-20 NOTE — ED Provider Notes (Signed)
History     CSN: [id number]  Arrival date & time 12/20/11  1954   First MD Initiated Contact with Patient 12/20/11 2104      Chief Complaint  Patient presents with  . Shortness of Breath    (Consider location/radiation/quality/duration/timing/severity/associated sxs/prior treatment) The history is provided by the patient.  Felicia Diaz is a 78 y.o. female hx of CHF, HTN here with complete heart block. She syncopized recently and was placed on holter monitor. Holter showed complete heart block so patient was sent in for admission and possible pacemaker placement. She has been short of breath recently and feels tired.    Past Medical History  Diagnosis Date  . CHF (congestive heart failure)     2D echo showed mild LVH and  hyperdynamic Left  ventricle with EF > 65%  . Pulmonary hypertension, mild   . Pericardial effusion     small pericardial effusion without evidence of amponade  . Hypertension   . Anemia     Admitted with severe anemia hemoglobin 5.8 colonoscopy pending  . Rheumatic heart disease      mild mitral stenosis and mild mitral regurgitation no personal history of rheumatic fever but history of rheumatic fever and sister   . Subendocardial infarction     In the setting of severe anemia. Mild troponin leak  . Carotid artery disease     Bilateral 70% ICA stenosis  . COPD (chronic obstructive pulmonary disease)   . Sleep apnea     Past Surgical History  Procedure Date  . Abdominal hysterectomy   . Breast lumpectomy   . Givens capsule study 01/02/2011    Procedure: GIVENS CAPSULE STUDY;  Surgeon: Malissa Hippo, MD;  Location: [facility];  Service: Endoscopy;  Laterality: N/A;  7:30 am    Family History  Problem Relation Age of Onset  . Colon cancer Father     died age 60  . Healthy Brother   . Heart disease Sister     History  Substance Use Topics  . Smoking status: Former Smoker -- 0.5 packs/day for 55 years    Types: Cigarettes    Quit date:  07/17/2010  . Smokeless tobacco: Never Used  . Alcohol Use: No    OB History    Grav Para Term Preterm Abortions TAB SAB Ect Mult Living                  Review of Systems  Respiratory: Positive for shortness of breath.   All other systems reviewed and are negative.    Allergies  Penicillins  Home Medications   Current Outpatient Rx  Name Route Sig Dispense Refill  . ASPIRIN 81 MG PO TABS Oral Take 81 mg by mouth daily.    Marland Kitchen CALCIUM CARBONATE-VITAMIN D 600-400 MG-UNIT PO TABS Oral Take 1 tablet by mouth daily.     . DEXLANSOPRAZOLE 60 MG PO CPDR Oral Take 60 mg by mouth daily.    Marland Kitchen FERROUS SULFATE 325 (65 FE) MG PO TABS Oral Take 325 mg by mouth daily. 1 time a day    . FUROSEMIDE 20 MG PO TABS Oral Take 20 mg by mouth daily.        BP 106/48  Pulse 64  Temp 98.1 F (36.7 C) (Oral)  Resp 16  SpO2 96%  Physical Exam  Nursing note and vitals reviewed. Constitutional: She is oriented to person, place, and time. She appears well-developed and well-nourished.  On cardiac monitor, comfortable   HENT:  Head: Normocephalic.  Mouth/Throat: Oropharynx is clear and moist.  Eyes: Conjunctivae normal are normal. Pupils are equal, round, and reactive to light.  Neck: Normal range of motion. Neck supple.  Cardiovascular: Normal heart sounds.        Bradycardic around 40s   Pulmonary/Chest: Effort normal.       Diffuse wheezing, good air movement   Abdominal: Soft. Bowel sounds are normal.  Musculoskeletal: Normal range of motion. She exhibits no edema.  Neurological: She is alert and oriented to person, place, and time.  Skin: Skin is warm and dry.  Psychiatric: She has a normal mood and affect. Her behavior is normal. Judgment and thought content normal.    ED Course  Procedures (including critical care time)  Labs Reviewed  CBC - Abnormal; Notable for the following:    WBC 13.5 (*)     All other components within normal limits  BASIC METABOLIC PANEL -  Abnormal; Notable for the following:    Sodium 129 (*)     Chloride 93 (*)     Glucose, Bld 146 (*)     GFR calc non Af Amer 58 (*)     GFR calc Af Amer 67 (*)     All other components within normal limits  PRO B NATRIURETIC PEPTIDE - Abnormal; Notable for the following:    Pro B Natriuretic peptide (BNP) 5962.0 (*)     All other components within normal limits  POCT I-STAT TROPONIN I   Dg Chest Portable 1 View  12/20/2011  *RADIOLOGY REPORT*  Clinical Data: Shortness of breath, cough  PORTABLE CHEST - 1 VIEW  Comparison: 12/05/2011  Findings: Cardiomegaly again noted.  Atherosclerotic calcifications of thoracic aorta.  No acute infiltrate or pulmonary edema.  Stable streaky bilateral basilar atelectasis or scarring.  IMPRESSION: No focal infiltrate or convincing pulmonary edema.  Stable streaky bilateral basilar atelectasis or scarring.   Original Report Authenticated By: Natasha Mead, M.D.      No diagnosis found.   Date: 12/20/2011  Rate: 43  Rhythm: complete heart block  QRS Axis: normal  Intervals: normal  ST/T Wave abnormalities: normal  Conduction Disutrbances:none  Narrative Interpretation:   Old EKG Reviewed: changes noted    MDM  Felicia Diaz is a 78 y.o. female hx of CHF now developing complete heart block. CBC showed WBC 13, CMP at baseline, trop neg. Pacer pads at bedside. Will admit to cardiology for complete heart block and possible AICD placement.          Richardean Canal, MD 12/20/11 2216

## 2011-12-20 NOTE — ED Notes (Signed)
Report given to floor, Ricki, Charity fundraiser. No further questions asked from RN. Preparing pt to be sent to the floor.

## 2011-12-20 NOTE — ED Notes (Signed)
EDP notified of patient's heart rate.

## 2011-12-20 NOTE — ED Notes (Signed)
Cardiologist at bedside.  

## 2011-12-20 NOTE — ED Notes (Signed)
Pt denies pain. Pt does not appear to be in any acute distress. HR remains in SB around 39bpm. Will continue to monitor pt.

## 2011-12-20 NOTE — ED Notes (Signed)
Pt placed on holter monitor for syncopal episode. PCP called pt today to about the episodes. Pt denies dizziness, chest pain, N/V. Pt c/o mild SOB. Pt recently admitted for upper respiratory infection.

## 2011-12-20 NOTE — ED Notes (Signed)
Pt states cardiologist called and told her to come to ED due to "2 events" that have showed on her heart monitor today.  Pt denies pain.  Only c/o mild sob.

## 2011-12-20 NOTE — H&P (Signed)
Primary Cardiologist: Antoine Poche Corinda Gubler)  Patient Location: to be evaluated in the ED  Chief Complaint: episodes of faintness  HPI:  Felicia Diaz is a 78 year old woman with a history of rheumatic mitral valve disease manifesting as concomitant mild mitral stenosis and moderate regurgitation, peripheral arterial disease, hypertension, anemia resulting from GI bleeding/AVMs, sleep apnea, and heart failure with preserved EF who presents with syncopal episodes over the past month.  She reports experiencing an episode of faintness in late August, during which she ended up on the ground without a clear memory of the event. She denies antecedent chest pain.Initially, she attributed her episode to Lipitor which she had just begun taking. Over the following 10 days, she experienced another similar episode prompoting hospitalization in early September at [place], subsequent to which she was outfitted with a 21 day cardiac monitor. Over the past 2 weeks, she has not had further episodes, though she has significantly curtailed her activity level. While she normally is quite active and independent, she has not really left her home since her hospitalization. She has not driven a motor vehicle during this time out of concern for having another episode. She has been sleeping in a recliner because laying flat causes her to feel congested, which sounds like orthopnea. She denies PND. She has not noted leg or abdominal swelling recently. No nausea, diarrhea, or abdominal pain. Her appetite has been generally preserved. No fevers or chills.   Earlier today (Friday) at 337pm, the monitor recorded and transmitted what appears to be a stretch of complete heart block with a ventricular escape rate of 38-40. After discussion with the [place] [facility], she agreed to come to the [facility] ED for further evaluation and management.   Past Medical History  Diagnosis Date  . CHF (congestive heart failure)     2D echo showed  mild LVH and  hyperdynamic Left  ventricle with EF > 65%  . Pulmonary hypertension, mild   . Pericardial effusion     small pericardial effusion without evidence of amponade  . Hypertension   . Anemia     Admitted with severe anemia hemoglobin 5.8 colonoscopy pending  . Rheumatic heart disease      mild mitral stenosis and mild mitral regurgitation no personal history of rheumatic fever but history of rheumatic fever and sister   . Subendocardial infarction     In the setting of severe anemia. Mild troponin leak  . Carotid artery disease     Bilateral 70% ICA stenosis  . COPD (chronic obstructive pulmonary disease)   . Sleep apnea   Atherosclerosis - CTA Neck in 10/2011 revealed 60% prox L subclavian disease and high-grade R internal carotid disease - TEE in 10/2011 revealed significant atherosclerotic disease of transverse aorta  Past Surgical History  Procedure Date  . Abdominal hysterectomy   . Breast lumpectomy   . Givens capsule study 01/02/2011    Procedure: GIVENS CAPSULE STUDY;  Surgeon: Malissa Hippo, MD;  Location: [facility];  Service: Endoscopy;  Laterality: N/A;  7:30 am    Family History  Problem Relation Age of Onset  . Colon cancer Father     died age 77  . Healthy Brother   . Heart disease Sister     Social History:  Past extensive tobacco use Retired; previously worked 35 years at[employer] Widowed; husband passed away 17 years ago Has an 50 year old brother in the area, as well as a nephew No children  Review of Systems: As per HPI; otherwise is comprehensively negative  Allergies:  Allergies  Allergen Reactions  . Penicillins Itching and Rash   Medications: Aspirin 81mg   Ca Carbonate-Vit D 1 tablet BID Cholecalciferol 1000 units Dexlansoprazole 60mg  Fe Sulfate 325mg  Furosemide 20mg  Multivitamin Nightly CPAP  Exam: Afebrile HR 39 BP 141/41 18 94% RA No acute distress; no accessory muscles being used to breath JVP is  elevated at approximately 12 cm H20 No carotid bruits appreciated Lungs crackles in lower half of each lung field; no wheezing Bradycardia, S1 S2 with systolic murmur heard best at apex Abdomen soft, non-tender; non-pulsatile liver Extremities warm and well-perfused without edema Neuro A&Ox3 Skin warm & dry  Labs: WBC 13.5K Hgb 12.5 Plts 226K Na 129 K 3.8 Cl 93 CO2 24 Anion gap =12 (normal) BUN 21 Cr 0.9 Ca 9.3 Glucose 146 Trop 0.06 Pro-BNP 5962  ECG: 3rd degree AV block with a ventricular escape rate of 43  CXR: no evident infiltrate, effusion, or edema   Assessment/Plan 78 year old woman with a recent history of syncopal episodes prompting deployment of a home cardiac monitor which today revealed a stretch of complete heart block with a ventricular escape rate of approximately 40 beats per minute. She is comfortable at rest, though her recent history and clinical exam are suggestive of a certain degree of acute decompensated heart failure and volume overload. She is warm and wet on exam and appears to be mentating normally. Given the detection of her complete heart block, which persists tonight, we will admit her to a Step Down bed for monitoring and further evaluation/management.  There are not any clearly reversible causes to her heart block, as she is not on any negatively chronotropic medications. I suspect that we will likely need to proceed with a plan for permanent pacemaker placement on Monday or Tuesday.  1) Complete heart block, currently maintaining BP and end-organ perfusion -  Cardiac monitoring on a step down unit - Permissive hypertension so as to allow for generation of adequate stroke volume & pulse pressure - Pacing pads at bedside in the event that she becomes acute symptomatic - I do not currently think she warrant more invasive measures (such as transvenous pacing), given her clinical stability  2) ADHF, warm & wet on exam - I suspect her bradycardia has  contributed to her currently mild degree of clinical heart failure - One time dose of Furosemide 20mg  with subsequent dosing as needed; will watch her K as it was 3.8 on admission  3) Athersclerotic disease - Continue ASA daily; may want to re-visit statin therapy since in retrospect it now appears that we have a better alternative explanation for her symptoms of faintness  4) Leukocytosis of 13.5K - No signs of active infection otherwise; CXR is unremarkable - Will check a UA and follow; no current need for antibiotics  I dicussed my impressions with the patient. Her questions were answered to the best of my ability.   Zacarias Pontes, MD Cardiology Fellow On-Call [phone]

## 2011-12-20 NOTE — ED Notes (Signed)
EKG completed at 2003 in triage. EKG given to Dr. Silverio Lay along with OLD ekg.

## 2011-12-20 NOTE — Telephone Encounter (Signed)
I spoke with [name]. I spoke at great length with a friend. The patient has a history of syncope and was hospitalized at [facility].  He has a monitor which she's been wearing for a few days. Her last syncopal episode was several weeks ago. The monitor tonight demonstrates episodes of complete heart block with ventricular escape in the 30s. This was at 3:37 PM. The patient was up and actually probably at a doctor's appointment at that moment. She does not report any syncope or presyncope. She's had no chest pain or palpitations. However, with her past history of syncope this is almost definitely the cause. I suggested the most cautious thing to do would be to come to our hospital to be admitted and observed with probable pacemaker placement on Monday or more emergently if needed. She is going to think about this but is unlikely to want to come in 6 over the weekend. She understands the risk of syncope and injury among other possibilities. She will call us back if she decides to come. Otherwise we will contact her to schedule followup and pacemaker placement. If she does not consent to come to the hospital over the weekend she would, there was an event such as syncope. She would come via EMS. Otherwise I will call her Monday morning.

## 2011-12-21 ENCOUNTER — Encounter (HOSPITAL_COMMUNITY): Payer: Self-pay | Admitting: *Deleted

## 2011-12-21 ENCOUNTER — Other Ambulatory Visit: Payer: Self-pay

## 2011-12-21 DIAGNOSIS — I442 Atrioventricular block, complete: Principal | ICD-10-CM

## 2011-12-21 LAB — BASIC METABOLIC PANEL
CO2: 25 mEq/L (ref 19–32)
Chloride: 96 mEq/L (ref 96–112)
Sodium: 130 mEq/L — ABNORMAL LOW (ref 135–145)

## 2011-12-21 LAB — CBC
HCT: 30.5 % — ABNORMAL LOW (ref 36.0–46.0)
MCV: 87.4 fL (ref 78.0–100.0)
Platelets: 203 10*3/uL (ref 150–400)
RBC: 3.49 MIL/uL — ABNORMAL LOW (ref 3.87–5.11)
WBC: 10.2 10*3/uL (ref 4.0–10.5)

## 2011-12-21 LAB — URINALYSIS, ROUTINE W REFLEX MICROSCOPIC
Bilirubin Urine: NEGATIVE
Hgb urine dipstick: NEGATIVE
Nitrite: NEGATIVE
Specific Gravity, Urine: 1.008 (ref 1.005–1.030)
pH: 5.5 (ref 5.0–8.0)

## 2011-12-21 LAB — MRSA PCR SCREENING: MRSA by PCR: NEGATIVE

## 2011-12-21 LAB — TSH: TSH: 0.226 u[IU]/mL — ABNORMAL LOW (ref 0.350–4.500)

## 2011-12-21 MED ORDER — SODIUM CHLORIDE 0.9 % IV SOLN: 250.0000 mL | INTRAVENOUS | Status: DC | PRN

## 2011-12-21 MED ORDER — CALCIUM CARBONATE-VITAMIN D 600-400 MG-UNIT PO TABS: 1.0000 | ORAL_TABLET | Freq: Every day | ORAL | Status: DC

## 2011-12-21 MED ORDER — CALCIUM CARBONATE-VITAMIN D 500-200 MG-UNIT PO TABS
1.0000 | ORAL_TABLET | Freq: Every day | ORAL | Status: DC
  Administered 2011-12-21 – 2011-12-24 (×3): 1 via ORAL
  Filled 2011-12-21 (×4): qty 1

## 2011-12-21 MED ORDER — FERROUS SULFATE 325 (65 FE) MG PO TABS
325.0000 mg | ORAL_TABLET | Freq: Every day | ORAL | Status: DC
  Administered 2011-12-21 – 2011-12-24 (×3): 325 mg via ORAL
  Filled 2011-12-21 (×5): qty 1

## 2011-12-21 MED ORDER — SODIUM CHLORIDE 0.9 % IJ SOLN
3.0000 mL | INTRAMUSCULAR | Status: DC | PRN
  Administered 2011-12-23: 3 mL via INTRAVENOUS

## 2011-12-21 MED ORDER — ASPIRIN 81 MG PO CHEW
81.0000 mg | CHEWABLE_TABLET | Freq: Every day | ORAL | Status: DC
  Administered 2011-12-21 – 2011-12-24 (×3): 81 mg via ORAL
  Filled 2011-12-21 (×4): qty 1

## 2011-12-21 MED ORDER — FUROSEMIDE 10 MG/ML IJ SOLN
20.0000 mg | Freq: Once | INTRAMUSCULAR | Status: AC
  Administered 2011-12-21: 20 mg via INTRAVENOUS
  Filled 2011-12-21: qty 2

## 2011-12-21 MED ORDER — SODIUM CHLORIDE 0.9 % IJ SOLN
3.0000 mL | Freq: Two times a day (BID) | INTRAMUSCULAR | Status: DC
  Administered 2011-12-21 – 2011-12-23 (×5): 3 mL via INTRAVENOUS

## 2011-12-21 MED ORDER — FUROSEMIDE 20 MG PO TABS
20.0000 mg | ORAL_TABLET | Freq: Every day | ORAL | Status: DC
  Administered 2011-12-21 – 2011-12-23 (×3): 20 mg via ORAL
  Filled 2011-12-21 (×6): qty 1

## 2011-12-21 MED ORDER — HEPARIN SODIUM (PORCINE) 5000 UNIT/ML IJ SOLN
5000.0000 [IU] | Freq: Three times a day (TID) | INTRAMUSCULAR | Status: DC
  Administered 2011-12-21 – 2011-12-23 (×7): 5000 [IU] via SUBCUTANEOUS
  Filled 2011-12-21 (×10): qty 1

## 2011-12-21 MED ORDER — SODIUM CHLORIDE 0.9 % IJ SOLN: 3.0000 mL | Freq: Two times a day (BID) | INTRAMUSCULAR | Status: DC

## 2011-12-21 MED ORDER — PANTOPRAZOLE SODIUM 40 MG PO TBEC
40.0000 mg | DELAYED_RELEASE_TABLET | Freq: Every day | ORAL | Status: DC
  Administered 2011-12-21 – 2011-12-24 (×4): 40 mg via ORAL
  Filled 2011-12-21 (×4): qty 1

## 2011-12-21 MED ORDER — ALBUTEROL SULFATE HFA 108 (90 BASE) MCG/ACT IN AERS
2.0000 | INHALATION_SPRAY | Freq: Four times a day (QID) | RESPIRATORY_TRACT | Status: DC
  Administered 2011-12-21 – 2011-12-22 (×5): 2 via RESPIRATORY_TRACT
  Filled 2011-12-21: qty 6.7

## 2011-12-21 MED ORDER — ASPIRIN 81 MG PO TABS: 81.0000 mg | ORAL_TABLET | Freq: Every day | ORAL | Status: DC

## 2011-12-21 NOTE — Progress Notes (Signed)
Living Will obtained from [facility] at the patient's request. Faxed copy was placed in the front of the patient's hard copy chart.Pt signed a release for the faxing of her healthcare record. It is also in the patient chart.

## 2011-12-21 NOTE — Progress Notes (Signed)
Order received to remove Holter monitor since patient is on the bedside telemetry and also on the external pacer to monitor. The unit was removed and placed in the patient's floral bag in the closet of room 2601.

## 2011-12-21 NOTE — Progress Notes (Signed)
PROGRESS NOTE  Subjective:   Felicia Diaz is a 78 yo with hx of MS / MR, PVD, HTN, anemia, sleep apnea and syncopal episodes.  She presented to the ER last night with syncope and was found to have completed heart block.  She is stable with an escape rhythm of 35.   Objective:    Vital Signs:   Temp:  [97.4 F (36.3 C)-98.3 F (36.8 C)] 98.2 F (36.8 C) (09/21 0736) Pulse Rate:  [34-64] 35  (09/21 0800) Resp:  [15-22] 17  (09/21 0800) BP: (74-141)/(26-104) 121/104 mmHg (09/21 0800) SpO2:  [92 %-99 %] 92 % (09/21 0800) Weight:  [137 lb 5.6 oz (62.3 kg)] 137 lb 5.6 oz (62.3 kg) (09/21 0028)  Last BM Date: 12/20/11   24-hour weight change: Weight change:   Weight trends: Filed Weights   12/21/11 0028  Weight: 137 lb 5.6 oz (62.3 kg)    Intake/Output:        Physical Exam: BP 121/104  Pulse 35  Temp 98.2 F (36.8 C) (Oral)  Resp 17  Ht 5' (1.524 m)  Wt 137 lb 5.6 oz (62.3 kg)  BMI 26.82 kg/m2  SpO2 92%  General: Vital signs reviewed and noted. Well-developed, well-nourished, in no acute distress; alert, appropriate and cooperative .  Head: Normocephalic, atraumatic.  Eyes: conjunctivae/corneas clear.  EOM's intact.   Throat: normal  Neck: Supple. Normal carotids. No JVD  Lungs:  Bilateral congestion , slight wheeze.  Heart: Regular rate,  With normal  S1 S2.bradycardia. Soft systolic murmur.  Abdomen:  Soft, non-tender, non-distended with normoactive bowel sounds. No hepatomegaly. No rebound/guarding. No abdominal masses.  Extremities: Distal pedal pulses are 2+ .  No edema.    Neurologic: A&O X3, CN II - XII are grossly intact. Motor strength is 5/5 in the all 4 extremities.  Psych: Responds to questions appropriately with normal affect.    Labs: BMET:  Basename 12/21/11 0400 12/20/11 2017  NA 130* 129*  K 3.8 3.8  CL 96 93*  CO2 25 24  GLUCOSE 111* 146*  BUN 19 21  CREATININE 0.76 0.93  CALCIUM 8.8 9.3  MG -- --  PHOS -- --    Liver function tests: No  results found for this basename: AST:2,ALT:2,ALKPHOS:2,BILITOT:2,PROT:2,ALBUMIN:2 in the last 72 hours No results found for this basename: LIPASE:2,AMYLASE:2 in the last 72 hours  CBC:  Basename 12/21/11 0400 12/20/11 2017  WBC 10.2 13.5*  NEUTROABS -- --  HGB 10.2* 12.5  HCT 30.5* 37.3  MCV 87.4 88.0  PLT 203 226    Cardiac Enzymes: No results found for this basename: CKTOTAL:4,CKMB:4,TROPONINI:4 in the last 72 hours  Coagulation Studies: No results found for this basename: LABPROT:5,INR:5 in the last 72 hours  Tele:  NSR with complete heart block and ventricular escape rhythm of 35   Medications:    Infusions:    Scheduled Medications:    . aspirin  81 mg Oral Daily  . calcium-vitamin D  1 tablet Oral Daily  . ferrous sulfate  325 mg Oral Q breakfast  . furosemide  20 mg Intravenous Once  . furosemide  20 mg Oral Daily  . heparin  5,000 Units Subcutaneous Q8H  . pantoprazole  40 mg Oral Q1200  . sodium chloride  3 mL Intravenous Q12H  . DISCONTD: aspirin  81 mg Oral Daily  . DISCONTD: Calcium Carbonate-Vitamin D  1 tablet Oral Daily  . DISCONTD: sodium chloride  3 mL Intravenous Q12H    Assessment/ Plan:  1. Complete heart block: for pacer Monday.  Check TSH.  2. Wheezing:  Will give albuterol..  3. Hyponatremia: NA 130 today.  The hyponatremia is likely due to her Lasix therapy.  Disposition: keep in 2500. For permanent pacer on Monday.  Length of Stay: 1  Vesta Mixer, Montez Hageman., MD, [facility] 12/21/2011, 10:08 AM Office [phone] Pager [phone]

## 2011-12-22 LAB — T3, FREE: T3, Free: 2.4 pg/mL (ref 2.3–4.2)

## 2011-12-22 MED ORDER — ALBUTEROL SULFATE (5 MG/ML) 0.5% IN NEBU: 2.5000 mg | INHALATION_SOLUTION | RESPIRATORY_TRACT | Status: DC | PRN

## 2011-12-22 MED ORDER — GUAIFENESIN 100 MG/5ML PO SYRP
200.0000 mg | ORAL_SOLUTION | ORAL | Status: DC | PRN
  Administered 2011-12-22 – 2011-12-23 (×3): 200 mg via ORAL
  Filled 2011-12-22 (×4): qty 118

## 2011-12-22 NOTE — Progress Notes (Signed)
Patients blood pressure noted to be low in the left arm 113/26 moved cuff to right arm 132/41. Called and made physician aware. Dr. Cathrine Muster and P.A. Arguello.

## 2011-12-22 NOTE — Progress Notes (Signed)
PROGRESS NOTE  Subjective:   Pt is a 78 yo with hx of MS / MR, PVD, HTN, anemia, sleep apnea and syncopal episodes.  She presented to the ER last night with syncope and was found to have completed heart block.  She is stable with an escape rhythm of 40.   Objective:    Vital Signs:   Temp:  [97.6 F (36.4 C)-98.3 F (36.8 C)] 98 F (36.7 C) (09/22 0726) Pulse Rate:  [34-53] 37  (09/22 0800) Resp:  [17-24] 22  (09/22 0800) BP: (110-148)/(25-123) 123/69 mmHg (09/22 0800) SpO2:  [91 %-99 %] 95 % (09/22 0800) Weight:  [136 lb 11 oz (62 kg)] 136 lb 11 oz (62 kg) (09/22 0419)  Last BM Date: 12/20/11   24-hour weight change: Weight change: -10.6 oz (-0.3 kg)  Weight trends: Filed Weights   12/21/11 0028 12/22/11 0419  Weight: 137 lb 5.6 oz (62.3 kg) 136 lb 11 oz (62 kg)    Intake/Output:  09/21 0701 - 09/22 0700 In: 1200 [P.O.:1200] Out: 3421 [Urine:3420; Stool:1] Total I/O In: -  Out: 75 [Urine:75]   Physical Exam: BP 123/69  Pulse 37  Temp 98 F (36.7 C) (Oral)  Resp 22  Ht 5' (1.524 m)  Wt 136 lb 11 oz (62 kg)  BMI 26.69 kg/m2  SpO2 95%  General: Vital signs reviewed and noted. Well-developed, well-nourished, in no acute distress; alert, appropriate and cooperative .  Head: Normocephalic, atraumatic.  Eyes: conjunctivae/corneas clear.  EOM's intact.   Throat: normal  Neck: Supple. Normal carotids. No JVD  Lungs:  Bilateral congestion , slight wheeze.  Heart: Regular rate,  With normal  S1 S2.bradycardia. Soft systolic murmur.  Abdomen:  Soft, non-tender, non-distended with normoactive bowel sounds. No hepatomegaly. No rebound/guarding. No abdominal masses.  Extremities: Distal pedal pulses are 2+ .  No edema.    Neurologic: A&O X3, CN II - XII are grossly intact. Motor strength is 5/5 in the all 4 extremities.  Psych: Responds to questions appropriately with normal affect.    Labs: BMET:  Basename 12/21/11 0400 12/20/11 2017  NA 130* 129*  K 3.8 3.8    CL 96 93*  CO2 25 24  GLUCOSE 111* 146*  BUN 19 21  CREATININE 0.76 0.93  CALCIUM 8.8 9.3  MG -- --  PHOS -- --    Liver function tests: No results found for this basename: AST:2,ALT:2,ALKPHOS:2,BILITOT:2,PROT:2,ALBUMIN:2 in the last 72 hours No results found for this basename: LIPASE:2,AMYLASE:2 in the last 72 hours  CBC:  Basename 12/21/11 0400 12/20/11 2017  WBC 10.2 13.5*  NEUTROABS -- --  HGB 10.2* 12.5  HCT 30.5* 37.3  MCV 87.4 88.0  PLT 203 226    Cardiac Enzymes: No results found for this basename: CKTOTAL:4,CKMB:4,TROPONINI:4 in the last 72 hours  Coagulation Studies: No results found for this basename: LABPROT:5,INR:5 in the last 72 hours  Tele:  NSR with complete heart block and ventricular escape rhythm of 35   Medications:    Infusions:    Scheduled Medications:    . albuterol  2 puff Inhalation Q6H  . aspirin  81 mg Oral Daily  . calcium-vitamin D  1 tablet Oral Daily  . ferrous sulfate  325 mg Oral Q breakfast  . furosemide  20 mg Oral Daily  . heparin  5,000 Units Subcutaneous Q8H  . pantoprazole  40 mg Oral Q1200  . sodium chloride  3 mL Intravenous Q12H    Assessment/ Plan:  1. Complete heart block: for pacer Monday.  stable  2. Low TSH:  Will check free T3 and T4.  2. Wheezing:  Will give albuterol..  3. Hyponatremia: NA 130 today.  The hyponatremia is likely due to her Lasix therapy.  Disposition: keep in 2500. For permanent pacer on Monday.  Have ordered her to be NPO past midnight.  Length of Stay: 2  Vesta Mixer, Montez Hageman., MD, [facility] 12/22/2011, 9:31 AM Office [phone] Pager [phone]

## 2011-12-23 ENCOUNTER — Encounter (HOSPITAL_COMMUNITY)
Admission: EM | Disposition: A | Discharge status: Home / Self Care | Payer: Self-pay | Source: Home / Self Care | Attending: Internal Medicine

## 2011-12-23 DIAGNOSIS — I442 Atrioventricular block, complete: Secondary | ICD-10-CM

## 2011-12-23 HISTORY — PX: PERMANENT PACEMAKER INSERTION: SHX5480

## 2011-12-23 HISTORY — PX: PACEMAKER PLACEMENT: SHX43

## 2011-12-23 SURGERY — PERMANENT PACEMAKER INSERTION
Anesthesia: LOCAL

## 2011-12-23 MED ORDER — VANCOMYCIN HCL IN DEXTROSE 1-5 GM/200ML-% IV SOLN
1000.0000 mg | Freq: Two times a day (BID) | INTRAVENOUS | Status: AC
  Filled 2011-12-23: qty 200

## 2011-12-23 MED ORDER — ONDANSETRON HCL 4 MG/2ML IJ SOLN: 4.0000 mg | Freq: Four times a day (QID) | INTRAMUSCULAR | Status: DC | PRN

## 2011-12-23 MED ORDER — HYDROCOD POLST-CHLORPHEN POLST 10-8 MG/5ML PO LQCR
5.0000 mL | Freq: Two times a day (BID) | ORAL | Status: DC | PRN
  Administered 2011-12-23 – 2011-12-24 (×2): 5 mL via ORAL
  Filled 2011-12-23 (×2): qty 5

## 2011-12-23 MED ORDER — YOU HAVE A PACEMAKER BOOK
Freq: Once | Status: AC
  Administered 2011-12-23: 22:00:00
  Filled 2011-12-23: qty 1

## 2011-12-23 MED ORDER — GUAIFENESIN-CODEINE 100-10 MG/5ML PO SOLN: 5.0000 mL | Freq: Two times a day (BID) | ORAL | Status: DC | PRN

## 2011-12-23 MED ORDER — CHLORHEXIDINE GLUCONATE 4 % EX LIQD
60.0000 mL | Freq: Once | CUTANEOUS | Status: DC
  Filled 2011-12-23: qty 60

## 2011-12-23 MED ORDER — SODIUM CHLORIDE 0.9 % IJ SOLN
3.0000 mL | INTRAMUSCULAR | Status: DC | PRN
  Administered 2011-12-23: 3 mL via INTRAVENOUS

## 2011-12-23 MED ORDER — SODIUM CHLORIDE 0.9 % IR SOLN
80.0000 mg | Status: DC
  Filled 2011-12-23: qty 2

## 2011-12-23 MED ORDER — MIDAZOLAM HCL 2 MG/2ML IJ SOLN
INTRAMUSCULAR | Status: AC
  Filled 2011-12-23: qty 2

## 2011-12-23 MED ORDER — GUAIFENESIN 100 MG/5ML PO SOLN
5.0000 mL | ORAL | Status: DC | PRN
  Administered 2011-12-23: 20:00:00 100 mg via ORAL
  Filled 2011-12-23: qty 5

## 2011-12-23 MED ORDER — ACETAMINOPHEN 325 MG PO TABS: 325.0000 mg | ORAL_TABLET | ORAL | Status: DC | PRN

## 2011-12-23 MED ORDER — SODIUM CHLORIDE 0.45 % IV SOLN
INTRAVENOUS | Status: DC
  Administered 2011-12-23: 10:00:00 via INTRAVENOUS

## 2011-12-23 MED ORDER — SODIUM CHLORIDE 0.9 % IJ SOLN
3.0000 mL | Freq: Two times a day (BID) | INTRAMUSCULAR | Status: DC
  Administered 2011-12-23: 3 mL via INTRAVENOUS

## 2011-12-23 MED ORDER — FENTANYL CITRATE 0.05 MG/ML IJ SOLN
INTRAMUSCULAR | Status: AC
  Filled 2011-12-23: qty 2

## 2011-12-23 MED ORDER — HEPARIN (PORCINE) IN NACL 2-0.9 UNIT/ML-% IJ SOLN
INTRAMUSCULAR | Status: AC
  Filled 2011-12-23: qty 500

## 2011-12-23 MED ORDER — LIDOCAINE HCL (PF) 1 % IJ SOLN
INTRAMUSCULAR | Status: AC
  Filled 2011-12-23: qty 60

## 2011-12-23 MED ORDER — SODIUM CHLORIDE 0.9 % IV SOLN
250.0000 mL | INTRAVENOUS | Status: DC
  Administered 2011-12-23: 250 mL via INTRAVENOUS

## 2011-12-23 MED ORDER — VANCOMYCIN HCL IN DEXTROSE 1-5 GM/200ML-% IV SOLN
1000.0000 mg | INTRAVENOUS | Status: DC
  Filled 2011-12-23: qty 200

## 2011-12-23 NOTE — Progress Notes (Addendum)
Patient has had a cough I gave her one dose of PRN cough med she did not get much relief I notified PA and she ordered a new med for her to try. Cough is not new per patient she had prior to coming in. II will continue to watch patient she had pacer placed today. Patient denies shortness of breath. Call bell within reach.

## 2011-12-23 NOTE — Interval H&P Note (Signed)
History and Physical Interval Note: I have discussed the indication for PPM with the patient along with risks/benefits/goals/expecatations and she wishes to proceed.  12/23/2011 12:47 PM  Louanne Skye  has presented today for surgery, with the diagnosis of CHB  The various methods of treatment have been discussed with the patient and family. After consideration of risks, benefits and other options for treatment, the patient has consented to  Procedure(s) (LRB) with comments: PERMANENT PACEMAKER INSERTION (N/A) as a surgical intervention .  The patient's history has been reviewed, patient examined, no change in status, stable for surgery.  I have reviewed the patient's chart and labs.  Questions were answered to the patient's satisfaction.     Lewayne Bunting

## 2011-12-23 NOTE — Op Note (Signed)
DDD PPM insertion via the left subclavian vein without immediate complication. [id number].

## 2011-12-23 NOTE — Consult Note (Signed)
ELECTROPHYSIOLOGY CONSULT NOTE  Patient ID: Felicia Diaz MRN: [id number], DOB/AGE: 1932-11-19   Admit date: 12/20/2011 Date of Consult: 12/23/2011  Primary Physician: Wyvonnia Lora, MD Primary Cardiologist: previously Andee Lineman, MD Reason for Consultation: complete heart block  History of Present Illness Felicia Diaz is a 78 year old woman with rheumatic mitral valve disease with mild mitral stenosis and moderate MR, PVD, COPD, HTN and prior severe anemia due to AVMs who has been admitted with syncope, found to have complete heart block in the 30s. She was recently admitted with similar symptoms to [facility] at which time there were no arrhythmias documented; however, she was given a 30-day event monitor as an outpatient. Felicia Diaz reports intermittent dizziness x 2 months. She describes dizziness, usually when seated, accompanied by "falls" on two occasions. Upon further questioning, she describes brief LOC with her falls. She denies any warning or prodrome. She reports the episodes have been just seconds in duration and she "came to" feeling like her usual self, although she could not recall how or why she got to the floor. She denies CP. She has chronic SOB due to her COPD; however, she has experienced increasing DOE x 2 months. She denies LE swelling, orthopnea or PND. She is not taking any AV nodal blocking medications. On the day of admission, a recording from her event monitor was sent in which revealed complete heart block; therefore, she was instructed to present to [facility] ED for further evaluation.    Past Medical History Past Medical History  Diagnosis Date  . CHF (congestive heart failure)     2D echo showed mild LVH and  hyperdynamic Left  ventricle with EF > 65%  . Pulmonary hypertension, mild   . Pericardial effusion     small pericardial effusion without evidence of amponade  . Hypertension   . Anemia     Admitted with severe anemia hemoglobin 5.8 colonoscopy pending  .  Rheumatic heart disease      mild mitral stenosis and mild mitral regurgitation no personal history of rheumatic fever but history of rheumatic fever and sister   . Subendocardial infarction     In the setting of severe anemia. Mild troponin leak  . Carotid artery disease     Bilateral 70% ICA stenosis  . COPD (chronic obstructive pulmonary disease)   . Sleep apnea     Past Surgical History Past Surgical History  Procedure Date  . Abdominal hysterectomy   . Breast lumpectomy   . Givens capsule study 01/02/2011    Procedure: GIVENS CAPSULE STUDY;  Surgeon: Malissa Hippo, MD;  Location: [facility] SUITE;  Service: Endoscopy;  Laterality: N/A;  7:30 am     Allergies/Intolerances Allergies  Allergen Reactions  . Penicillins Itching and Rash    Inpatient Medications . aspirin  81 mg Oral Daily  . calcium-vitamin D  1 tablet Oral Daily  . ferrous sulfate  325 mg Oral Q breakfast  . furosemide  20 mg Oral Daily  . heparin  5,000 Units Subcutaneous Q8H  . pantoprazole  40 mg Oral Q1200  . sodium chloride  3 mL Intravenous Q12H  . DISCONTD: albuterol  2 puff Inhalation Q6H   Family History Family History  Problem Relation Age of Onset  . Colon cancer Father     died age 74  . Healthy Brother   . Heart disease Sister      Social History Social History  . Marital Status: Widowed   Social  History Main Topics  . Smoking status: Former Smoker -- 0.5 packs/day for 55 years    Types: Cigarettes    Quit date: 07/17/2010  . Smokeless tobacco: Never Used  . Alcohol Use: No  . Drug Use: No   Social History Narrative   WidowLives alone/currently staying at [employer] has a very close friend who she speaks with on a regular basis whom is very involved in her life    Review of Systems General: No chills, fever, night sweats or weight changes  Cardiovascular: No chest pain, edema, orthopnea, palpitations, paroxysmal nocturnal dyspnea Dermatological: No rash, lesions  or masses Respiratory: No cough Urologic: No hematuria, dysuria Abdominal: No nausea, vomiting, diarrhea, bright red blood per rectum, melena, or hematemesis Neurologic: No visual changes, difficulty moving extremities All other systems reviewed and are otherwise negative except as noted above.  Physical Exam Blood pressure 109/33, pulse 33, temperature 97.4 F (36.3 C), temperature source Oral, resp. rate 17, height 5' (1.524 m), weight 134 lb 7.7 oz (61 kg), SpO2 98.00%.  General: Well developed, well appearing 78 year old female in no acute distress. HEENT: Normocephalic, atraumatic. EOMs intact. Sclera nonicteric. Oropharynx clear.  Neck: Supple. No JVD. Lungs: Respirations regular and unlabored. Diminished breath sounds but CTA bilaterally. No wheezes, rales or rhonchi. Heart: Bradycardic. S1, S2 present. III/VI systolic murmur noted, best heart at apex. No rub, S3 or S4. Abdomen: Soft, non-distended.  Extremities: No clubbing, cyanosis or edema. DP/PT/Radials 2+ and equal bilaterally. Psych: Normal affect. Neuro: Alert and oriented X 3. Moves all extremities spontaneously. Musculoskeletal: No kyphosis. Skin: Intact. Warm and dry. No rashes or petechiae in exposed areas.   Labs No results found for this basename: CKTOTAL:4,CKMB:4,TROPONINI:4 in the last 72 hours Lab Results  Component Value Date   WBC 10.2 12/21/2011   HGB 10.2* 12/21/2011   HCT 30.5* 12/21/2011   MCV 87.4 12/21/2011   PLT 203 12/21/2011    Lab 12/21/11 0400  NA 130*  K 3.8  CL 96  CO2 25  BUN 19  CREATININE 0.76  CALCIUM 8.8  PROT --  BILITOT --  ALKPHOS --  ALT --  AST --  GLUCOSE 111*   No components found with this basename: MAGNESIUM No components found with this basename: POCBNP:3   Basename 12/22/11 1000 12/21/11 1025  TSH -- 0.226*  T4TOTAL -- --  T3FREE 2.4 --  THYROIDAB -- --    Radiology/Studies Dg Chest Portable 1 View  12/20/2011  *RADIOLOGY REPORT*  Clinical Data: Shortness of  breath, cough  PORTABLE CHEST - 1 VIEW  Comparison: 12/05/2011  Findings: Cardiomegaly again noted.  Atherosclerotic calcifications of thoracic aorta.  No acute infiltrate or pulmonary edema.  Stable streaky bilateral basilar atelectasis or scarring.  IMPRESSION: No focal infiltrate or convincing pulmonary edema.  Stable streaky bilateral basilar atelectasis or scarring.    Original Report Authenticated By: Natasha Mead, M.D.    Echocardiogram (transesophageal echo performed July 2013)  Conclusions: Normal LV size and systolic function, EF 55-60%; no LVH; normal wall motion Normal RV size and systolic function Moderately dilated LA without thrombus Normal RA size No aortic stenosis or regurgitation Rheumatic mitral valve with mild MS and moderate MR Mild TR No pericardial effusion Grade 4 atheroma in transverse aorta  12-lead ECG from 12/21/2011 shows complete heart block at 35 bpm; RBBB pattern Telemetry shows persistent complete heart block with rates in the 30s  Assessment and Plan 1. Complete heart block Ms. Spratt presents with symptomatic  complete heart block. There have been no reversible causes identified. Therefore, we have recommended PPM implantation. Risks, benefits and alternatives to PPM implantation were discussed in detail with Ms. Poteat today. These risks include, but are not limited to, bleeding, infection, pneumothorax, perforation, tamponade, vascular damage, lead dislodgement, renal failure, MI, stroke or death. Ms. Parkhill expressed verbal understanding and agrees to proceed with PPM implantation.  Dr. Ladona Ridgel to see and make further recommendations. Signed, Rick Duff, PA-C 12/23/2011, 8:33 AM  EP Attending Patient seen and examined. Agree with history, physical exam, assessment and plan. She has symptomatic complete heart block and syncope. I have discussed the risks/benefits/goals/expectations of PPM with the patient and she wishes to proceed.  Leonia Reeves.D.

## 2011-12-23 NOTE — Progress Notes (Signed)
Orthopedic Tech Progress Note Patient Details:  Felicia Diaz 08/17/1932 [id number] Arm Sling applied to Left arm. Friend present in room with patient. Ortho Devices Type of Ortho Device: Arm foam sling Ortho Device/Splint Location: Applied to Left UE Ortho Device/Splint Interventions: Application   Asia R Thompson 12/23/2011, 3:39 PM

## 2011-12-23 NOTE — H&P (View-Only) (Signed)
 PROGRESS NOTE  Subjective:   Pt is a 78 yo with hx of MS / MR, PVD, HTN, anemia, sleep apnea and syncopal episodes.  She presented to the ER last night with syncope and was found to have completed heart block.  She is stable with an escape rhythm of 35.   Objective:    Vital Signs:   Temp:  [97.4 F (36.3 C)-98.3 F (36.8 C)] 98.2 F (36.8 C) (09/21 0736) Pulse Rate:  [34-64] 35  (09/21 0800) Resp:  [15-22] 17  (09/21 0800) BP: (74-141)/(26-104) 121/104 mmHg (09/21 0800) SpO2:  [92 %-99 %] 92 % (09/21 0800) Weight:  [137 lb 5.6 oz (62.3 kg)] 137 lb 5.6 oz (62.3 kg) (09/21 0028)  Last BM Date: 12/20/11   24-hour weight change: Weight change:   Weight trends: Filed Weights   12/21/11 0028  Weight: 137 lb 5.6 oz (62.3 kg)    Intake/Output:        Physical Exam: BP 121/104  Pulse 35  Temp 98.2 F (36.8 C) (Oral)  Resp 17  Ht 5' (1.524 m)  Wt 137 lb 5.6 oz (62.3 kg)  BMI 26.82 kg/m2  SpO2 92%  General: Vital signs reviewed and noted. Well-developed, well-nourished, in no acute distress; alert, appropriate and cooperative .  Head: Normocephalic, atraumatic.  Eyes: conjunctivae/corneas clear.  EOM's intact.   Throat: normal  Neck: Supple. Normal carotids. No JVD  Lungs:  Bilateral congestion , slight wheeze.  Heart: Regular rate,  With normal  S1 S2.bradycardia. Soft systolic murmur.  Abdomen:  Soft, non-tender, non-distended with normoactive bowel sounds. No hepatomegaly. No rebound/guarding. No abdominal masses.  Extremities: Distal pedal pulses are 2+ .  No edema.    Neurologic: A&O X3, CN II - XII are grossly intact. Motor strength is 5/5 in the all 4 extremities.  Psych: Responds to questions appropriately with normal affect.    Labs: BMET:  Basename 12/21/11 0400 12/20/11 2017  NA 130* 129*  K 3.8 3.8  CL 96 93*  CO2 25 24  GLUCOSE 111* 146*  BUN 19 21  CREATININE 0.76 0.93  CALCIUM 8.8 9.3  MG -- --  PHOS -- --    Liver function tests: No  results found for this basename: AST:2,ALT:2,ALKPHOS:2,BILITOT:2,PROT:2,ALBUMIN:2 in the last 72 hours No results found for this basename: LIPASE:2,AMYLASE:2 in the last 72 hours  CBC:  Basename 12/21/11 0400 12/20/11 2017  WBC 10.2 13.5*  NEUTROABS -- --  HGB 10.2* 12.5  HCT 30.5* 37.3  MCV 87.4 88.0  PLT 203 226    Cardiac Enzymes: No results found for this basename: CKTOTAL:4,CKMB:4,TROPONINI:4 in the last 72 hours  Coagulation Studies: No results found for this basename: LABPROT:5,INR:5 in the last 72 hours  Tele:  NSR with complete heart block and ventricular escape rhythm of 35   Medications:    Infusions:    Scheduled Medications:    . aspirin  81 mg Oral Daily  . calcium-vitamin D  1 tablet Oral Daily  . ferrous sulfate  325 mg Oral Q breakfast  . furosemide  20 mg Intravenous Once  . furosemide  20 mg Oral Daily  . heparin  5,000 Units Subcutaneous Q8H  . pantoprazole  40 mg Oral Q1200  . sodium chloride  3 mL Intravenous Q12H  . DISCONTD: aspirin  81 mg Oral Daily  . DISCONTD: Calcium Carbonate-Vitamin D  1 tablet Oral Daily  . DISCONTD: sodium chloride  3 mL Intravenous Q12H    Assessment/ Plan:      1. Complete heart block: for pacer Monday.  Check TSH.  2. Wheezing:  Will give albuterol..  3. Hyponatremia: NA 130 today.  The hyponatremia is likely due to her Lasix therapy.  Disposition: keep in 2500. For permanent pacer on Monday.  Length of Stay: 1  Bill Yohn J. Gaylynn Seiple, Jr., MD, FACC 12/21/2011, 10:08 AM Office [phone] Pager [phone]     

## 2011-12-24 ENCOUNTER — Inpatient Hospital Stay (HOSPITAL_COMMUNITY): Payer: Medicare Other

## 2011-12-24 MED ORDER — VANCOMYCIN HCL IN DEXTROSE 1-5 GM/200ML-% IV SOLN
1000.0000 mg | Freq: Once | INTRAVENOUS | Status: AC
  Administered 2011-12-24: 11:00:00 1000 mg via INTRAVENOUS
  Filled 2011-12-24: qty 200

## 2011-12-24 MED ORDER — FUROSEMIDE 10 MG/ML IJ SOLN
40.0000 mg | Freq: Once | INTRAMUSCULAR | Status: AC
  Administered 2011-12-24: 10:00:00 40 mg via INTRAVENOUS
  Filled 2011-12-24: qty 4

## 2011-12-24 MED ORDER — INFLUENZA VIRUS VACC SPLIT PF IM SUSP
0.5000 mL | Freq: Once | INTRAMUSCULAR | Status: AC
  Administered 2011-12-24: 0.5 mL via INTRAMUSCULAR
  Filled 2011-12-24: qty 0.5

## 2011-12-24 NOTE — Discharge Summary (Signed)
ELECTROPHYSIOLOGY DISCHARGE SUMMARY    Patient ID: Felicia Diaz,  MRN: [id number], DOB/AGE: 04-04-1932 78 y.o.  Admit date: 12/20/2011 Discharge date: 12/25/2011  Primary Care Physician: Felicia Lora, MD Primary Cardiologist: Felicia Poche, MD   Primary Discharge Diagnosis:  1. Complete heart block s/p PPM implantations  Secondary Discharge Diagnoses:  1. Rheumatic valvular disease - mild MS, mod MR 2. PVD 3. COPD 4. OSA 5. HTN 6. Severe anemia with acute GI blood loss due to AVMs  Procedures This Admission:  1. Dual chamber PPM implantation 12/23/2011 St. Jude model 2088 T52 cm active fixation pacing lead serial# [id number] was advanced into the right ventricle and the St. Jude model [phone] cm active fixation pacing lead serial number [id number] was advanced to right atrium. St. Jude dual-chamber pacemaker serial number [id number].  History and Hospital Course:  Felicia Diaz is a 78 year old woman with rheumatic mitral valve disease with mild mitral stenosis and moderate MR, PVD, COPD, HTN and prior severe anemia due to AVMs who has been admitted with syncope, found to have complete heart block in the 30s. She was recently admitted with similar symptoms to [facility] at which time there were no arrhythmias documented; however, she was given a 30-day event monitor as an outpatient. Felicia Diaz reports intermittent dizziness x 76 months. She describes dizziness, usually when seated, accompanied by "falls" on two occasions. Upon further questioning, she describes brief LOC with her falls. She denies any warning or prodrome. She reports the episodes have been just seconds in duration and she "came to" feeling like her usual self, although she could not recall how or why she got to the floor. She denies CP. She has chronic SOB due to her COPD; however, she has experienced increasing DOE x 2 months. She denies LE swelling, orthopnea or PND. She is not taking any AV nodal blocking medications. On the  day of admission, a recording from her event monitor was sent in which revealed complete heart block; therefore, she was instructed to present to [facility] ED for further evaluation. On admission she was found to have CHB with rates in the low 30s. She underwent PPM implantation on 12/23/2011. Felicia Diaz tolerated this procedure well without any immediate complication. She remains hemodynamically stable and afebrile. Her chest xray shows stable lead placement without pneumothorax. Her device interrogation has been reviewed by Dr. Ladona Ridgel and shows normal PPM function with stable lead parameters/measurements. Her implant site is intact without significant bleeding or hematoma. She has been given discharge instructions including wound care and activity restrictions. She will follow-up in 10 days for wound check. There were no changes made to her medications. Of note, she was evaluated by PT for assessment regarding home mobility needs. She will have continued home health PT after discharge. She has been seen, examined and deemed stable for discharge today by Dr. Lewayne Bunting.  Discharge Vitals: Blood pressure 126/51, pulse 73, temperature 98 F (36.7 C), temperature source Oral, resp. rate 25, height 5' (1.524 m), weight 137 lb 8 oz (62.37 kg), SpO2 94.00%.   Labs: Lab Results  Component Value Date   WBC 10.2 12/21/2011   HGB 10.2* 12/21/2011   HCT 30.5* 12/21/2011   MCV 87.4 12/21/2011   PLT 203 12/21/2011     Lab 12/21/11 0400  NA 130*  K 3.8  CL 96  CO2 25  BUN 19  CREATININE 0.76  CALCIUM 8.8  PROT --  BILITOT --  ALKPHOS --  ALT --  AST --  GLUCOSE 111*    Disposition:  The patient is being discharged in stable condition.  Follow-up: Follow-up Information    Follow up with [facility] On 01/02/2012. (At 9:00 AM for wound check)    Contact information:   Architectural technologist - [place] [address] [place] [address] [phone]      Follow up with  Rollene Rotunda, MD. On 01/06/2012. (At 10:45 AM for routine follow-up (valvular heart disease - previously followed by Dr. Andee Lineman))    Contact information:   [facility] [place] [address] [place]  [address] [place] [address] [phone]      Follow up with Lewayne Bunting, MD. On 03/31/2012. (At 4:15 PM for pacemaker follow-up)    Contact information:   Architectural technologist - [place] [address] [place] [address] [phone]   Discharge Medications:    Medication List     As of 12/25/2011 12:37 PM    TAKE these medications         aspirin 81 MG tablet   Take 81 mg by mouth daily.      CALTRATE 600+D 600-400 MG-UNIT per tablet   Generic drug: Calcium Carbonate-Vitamin D   Take 1 tablet by mouth daily.      DEXILANT 60 MG capsule   Generic drug: dexlansoprazole   Take 60 mg by mouth daily.      ferrous sulfate 325 (65 FE) MG tablet   Take 325 mg by mouth daily. 1 time a day      furosemide 20 MG tablet   Commonly known as: LASIX   Take 20 mg by mouth daily.       Duration of Discharge Encounter: Greater than 30 minutes including physician time.  Limmie Patricia, PA-C 12/25/2011, 12:37 PM

## 2011-12-24 NOTE — Care Management Note (Signed)
    Page 1 of 1   12/24/2011     1:27:54 PM   CARE MANAGEMENT NOTE 12/24/2011  Patient:  Felicia Diaz, Felicia Diaz   Account Number:  [id number]  Date Initiated:  12/24/2011  Documentation initiated by:  CRAFT,TERRI  Subjective/Objective Assessment:   78 yo female admitted 12/20/11 with complete heart block     Action/Plan:   D/C when medically stable   Anticipated DC Date:  12/27/2011   Anticipated DC Plan:  HOME W HOME HEALTH SERVICES      DC Planning Services  CM consult      [facility] Choice  HOME HEALTH   Choice offered to / List presented to:  C-1 Patient        HH arranged  HH-1 RN  HH-4 NURSE'S AIDE      HH agency  [facility]   Status of service:  Completed, signed off  Discharge Disposition:  HOME W HOME HEALTH SERVICES  Per UR Regulation:  Reviewed for med. necessity/level of care/duration of stay   Comments:  12/24/11, Kathi Der RNC-MNN, BSN, [phone], CM received referral.  CM met with pt and offered choice for [facility] services.  Pt chose [facility].  Marie at [facility] contacted with order and confirmation received.  Pt states she has walker and BSC at home.  Pt states her niece will be able to assist her at home.

## 2011-12-24 NOTE — Progress Notes (Signed)
Patient ID: Felicia Diaz, female   DOB: 01/07/1933, 78 y.o.   MRN: [id number] Subjective:  No chest pain or sob. "I am still weak but feel better"  Objective:  Vital Signs in the last 24 hours: Temp:  [97.3 F (36.3 C)-98.5 F (36.9 C)] 98 F (36.7 C) (09/24 0500) Pulse Rate:  [34-79] 70  (09/24 0500) Resp:  [16-28] 20  (09/24 0500) BP: (121-148)/(31-56) 138/44 mmHg (09/24 0500) SpO2:  [89 %-96 %] 94 % (09/24 0500) Weight:  [137 lb 8 oz (62.37 kg)] 137 lb 8 oz (62.37 kg) (09/24 0500)  Intake/Output from previous day: 09/23 0701 - 09/24 0700 In: -  Out: 1775 [Urine:1775] Intake/Output from this shift:    Physical Exam: Well appearing NAD HEENT: Unremarkable Neck:  No JVD, no thyromegally Lungs:  Clear with no wheezes. No pocket hematoma. HEART:  Regular rate rhythm, no murmurs, no rubs, no clicks Abd:  Flat, positive bowel sounds, no organomegally, no rebound, no guarding Ext:  2 plus pulses, no edema, no cyanosis, no clubbing Skin:  No rashes no nodules Neuro:  CN II through XII intact, motor grossly intact  Lab Results: No results found for this basename: WBC:2,HGB:2,PLT:2 in the last 72 hours No results found for this basename: NA:2,K:2,CL:2,CO2:2,GLUCOSE:2,BUN:2,CREATININE:2 in the last 72 hours No results found for this basename: TROPONINI:2,CK,MB:2 in the last 72 hours Hepatic Function Panel No results found for this basename: PROT,ALBUMIN,AST,ALT,ALKPHOS,BILITOT,BILIDIR,IBILI in the last 72 hours No results found for this basename: CHOL in the last 72 hours No results found for this basename: PROTIME in the last 72 hours  Imaging: Dg Chest 2 View  12/24/2011  *RADIOLOGY REPORT*  Clinical Data: Status post pacemaker placement, short of breath  CHEST - 2 VIEW  Comparison: Preoperative chest radiograph 12/20/2011  Findings: Interval placement of a left subclavian approach dual lead cardiac rhythm maintenance device.  Leads project over the right atrium and right  ventricular apex.  No evidence of complicating pneumothorax.  There are small bilateral pleural effusions.  Unchanged mild cardiomegaly.  Slightly increased pulmonary vascular congestion and bibasilar opacities. Atherosclerotic calcification noted in the transverse aorta.  The bones are osteopenic.  No acute osseous finding.  IMPRESSION:  1.  Interval placement of a left subclavian approach dual lead cardiac rhythm maintenance device without evidence of immediate complication.  2.  Slightly increased pulmonary vascular congestion without overt edema  3.  Increasing bibasilar opacities likely reflecting a combination of small pleural effusions and atelectasis.   Original Report Authenticated By: HEATH     Cardiac Studies: Tele - NSR with ventricular pacing Assessment/Plan:  1. CHB 2. S/p PPM Rec: progress activity. Ok to discharge from EP perspective. Consider addition of diuretic based on cxr results. 40 mg IV  LOS: 4 days    Buel Ream.D. 12/24/2011, 8:45 AM

## 2011-12-24 NOTE — Progress Notes (Signed)
Physical Therapy Evaluation Patient Details Name: Felicia Diaz MRN: [id number] DOB: 04-01-1933 Today's Date: 12/24/2011 Time: 8469-6295 PT Time Calculation (min): 36 min  PT Assessment / Plan / Recommendation Clinical Impression  78 yo female admitted with syncope secondary to complete heart block, now s/p pacemaker placement; Presents with decr activity tol, and will benfit from PT in acute and with HHPT to maximize independence and safety managing at home alone    PT Assessment  Patient needs continued PT services    Follow Up Recommendations  Home health PT;Supervision - Intermittent  HHAide   Barriers to Discharge Other (comment) (Definitely could use HHAide assist) Pt clearly wants to dc home, and is managing transfers and household distances adequately for discharge; Will benefit from having extra help (family? Aide? Housekeeping assist?), and pt states she had extra help before that insurance paid for -- requesting that Case Mgr help to see how much help insurance will cover    Equipment Recommendations  None recommended by PT    Recommendations for Other Services     Frequency Min 3X/week    Precautions / Restrictions Precautions Precautions: ICD/Pacemaker   Pertinent Vitals/Pain No specific reports of pain Somewhat SOB with amb incr distance, SaO2 93% HR 91-95       Mobility  Bed Mobility Bed Mobility: Supine to Sit;Sitting - Scoot to Edge of Bed Supine to Sit: 4: Min guard;HOB flat (without physical contact) Sitting - Scoot to Edge of Bed: 5: Supervision Details for Bed Mobility Assistance: cues for technique, and to refrain from using LUE to push; had to problem-solve as pt gets up on left side of bed at home (she does have the option of sleeping in recliner Transfers Transfers: Sit to Stand;Stand to Sit Sit to Stand: 4: Min guard;From bed;From chair/3-in-1;With upper extremity assist (with and without physical contact) Stand to Sit: 4: Min guard;To  chair/3-in-1;With upper extremity assist (without physical contact) Details for Transfer Assistance: cues for safe technique and hand placement; noted uncontrolled descent to chair when doesn't use RUE on armrest for control Ambulation/Gait Ambulation/Gait Assistance: 4: Min guard;5: Supervision Ambulation Distance (Feet): 140 Feet Assistive device: Rolling walker Ambulation/Gait Assistance Details: Minguard progressing to supervision; cues for RW proximity, and posture, as well as self-monitor for activity tol; roequired one seatedrest break, but Managing more than household distances well Gait Pattern: Decreased stride length General Gait Details: subjective report of feeling weak with incr amb distance    Exercises     PT Diagnosis: Generalized weakness  PT Problem List: Decreased strength;Decreased activity tolerance;Decreased mobility;Decreased knowledge of use of DME;Decreased knowledge of precautions PT Treatment Interventions: DME instruction;Gait training;Stair training;Functional mobility training;Therapeutic activities;Therapeutic exercise;Balance training;Patient/family education   PT Goals Acute Rehab PT Goals PT Goal Formulation: With patient Time For Goal Achievement: 01/07/12 Potential to Achieve Goals: Good Pt will go Supine/Side to Sit: with modified independence PT Goal: Supine/Side to Sit - Progress: Goal set today Pt will go Sit to Supine/Side: with modified independence PT Goal: Sit to Supine/Side - Progress: Goal set today Pt will go Sit to Stand: with modified independence PT Goal: Sit to Stand - Progress: Goal set today Pt will go Stand to Sit: with modified independence PT Goal: Stand to Sit - Progress: Goal set today Pt will Ambulate: >150 feet;with modified independence;with least restrictive assistive device PT Goal: Ambulate - Progress: Goal set today Pt will Go Up / Down Stairs: 1-2 stairs;with modified independence;with least restrictive assistive  device;with rolling walker PT Goal: Up/Down Stairs -  Progress: Goal set today  Visit Information  Last PT Received On: 12/24/11 Assistance Needed: +1    Subjective Data  Subjective: Really wanting to go home today Patient Stated Goal: back to independence; back to driving   Prior Functioning  Home Living Lives With: Alone Available Help at Discharge: Personal care attendant (not 24 hours) Type of Home: House Home Access: Stairs to enter [employer] of Steps: 1 Entrance Stairs-Rails: None Home Layout: One level Bathroom Shower/Tub: Heritage manager Accessibility: Yes How Accessible: Accessible via walker Home Adaptive Equipment: Shower chair with back;Walker - rolling;Bedside commode/3-in-1 Prior Function Level of Independence: Needs assistance Driving: Yes (Until recent syncopal episodes) Comments: REcently has curtailed her typical independent activity secondary to these episodes; Has has an aide/private duty assist recently as well; Pt reports that her insurance has paid for this assist Communication Communication: No difficulties Dominant Hand: Right    Cognition  Overall Cognitive Status: Appears within functional limits for tasks assessed/performed Arousal/Alertness: Awake/alert Orientation Level: Appears intact for tasks assessed Behavior During Session: [facility] for tasks performed    Extremity/Trunk Assessment Right Upper Extremity Assessment RUE ROM/Strength/Tone: [facility] for tasks assessed Left Upper Extremity Assessment LUE ROM/Strength/Tone: Deficits;Due to precautions (pacemake prec) LUE ROM/Strength/Tone Deficits: Able to operate within pacemake precautions Right Lower Extremity Assessment RLE ROM/Strength/Tone: Deficits RLE ROM/Strength/Tone Deficits: Generally weak, depending on RUE push for successful sit to stand Left Lower Extremity Assessment LLE ROM/Strength/Tone: Deficits LLE ROM/Strength/Tone Deficits: Generally weak, dependeing on RUE  push for successful sit to stand   Balance    End of Session PT - End of Session Equipment Utilized During Treatment: Gait belt Activity Tolerance: Patient tolerated treatment well Patient left: with call bell/phone within reach (in bathroom, nursing aware) Nurse Communication: Mobility status (recs for dc home)  GP     Van Clines [facility] [place], [place] [phone]  12/24/2011, 10:44 AM

## 2011-12-24 NOTE — Op Note (Signed)
NAMEJANNATUL, Felicia Diaz               ACCOUNT NO.:  [id number]  MEDICAL RECORD NO.:  [id number]  LOCATION:  6525                         FACILITY:  [facility]  PHYSICIAN:  Doylene Canning. Ladona Ridgel, MD    DATE OF BIRTH:  05/16/1932  DATE OF PROCEDURE:  12/23/2011 DATE OF DISCHARGE:                              OPERATIVE REPORT   PROCEDURE PERFORMED:  Insertion of dual-chamber pacemaker.  INDICATION:  Complete heart block.  INTRODUCTION:  The patient is a 78 year old woman who was admitted to hospital with syncope and was found to have complete heart block.  She is now referred for permanent pacemaker insertion.  PROCEDURE:  After informed consent was obtained, the patient was taken to the diagnostic EP lab in a fasting state.  After usual preparation and draping, intravenous fentanyl and midazolam were given for sedation. 30 mL of lidocaine was infiltrated into the left infraclavicular region. 5-cm incision was carried out over this region.  Electrocautery was utilized to dissect down to the fascial plane.  The left subclavian vein was attempted to be punctured but was unsuccessful.  Venography of the left subclavian venous system, demonstrated that the vein was very small and superiorly displaced.  She was subsequently successfully punctured and a St. Jude model 2088 T52 cm active fixation pacing lead serial# [id number] was advanced into the right ventricle and the St. Jude model 2088 T46 cm active fixation pacing lead serial number [id number] was advanced to right atrium.  Mapping was carried out in the right ventricle.  At the final site, the R-waves measured 10 mV, the pacing impedance was 750 ohms.  The threshold was less than 0.5 V at 0.5 msec.  There was large injury current with active fixation of the lead and 10 V pacing not stimulate the diaphragm.  With the ventricular lead in satisfactory position, attention was turned to placement of the atrial lead was placed in anterolateral portion  of the right atrium where P-waves measured 3 mV.  The pacing impedance was 590 ohms, threshold 0.7 V at 0.5 msec.  Again, 10 V pacing did not stimulate the diaphragm.  There was a large injury current.  With the atrial and the ventricular lead in satisfactory position, they were secured to the subpectoral fascia with a figure-of-eight silk suture.  The sewing sleeve was secured with silk suture.  Electrocautery was utilized to make subcutaneous pocket. Antibiotic irrigation was utilized to irrigate the pocket. Electrocautery was utilized to assure hemostasis.  The St. Jude dual- chamber pacemaker serial number [id number] was connected to the atrial and ventricular leads and placed back in the subcutaneous pockets were secured with silk suture.  The pocket was irrigated with antibiotic irrigation.  The incision closed with 2-0 and 3-0 Vicryl.  Benzoin and Steri-Strips were painted on the skin, pressure dressing was applied. The patient was returned to room in satisfactory condition.  COMPLICATIONS:  There were no immediate procedure complications for results demonstrate successful implantation of a St. Jude dual-chamber pacemaker.  The patient has symptomatic complete heart block.     Doylene Canning. Ladona Ridgel, MD     GWT/MEDQ  D:  12/23/2011  T:  12/24/2011  Job:  [id number]

## 2011-12-26 ENCOUNTER — Encounter: Payer: Self-pay | Admitting: *Deleted

## 2011-12-26 DIAGNOSIS — R269 Unspecified abnormalities of gait and mobility: Secondary | ICD-10-CM

## 2011-12-26 DIAGNOSIS — IMO0001 Reserved for inherently not codable concepts without codable children: Secondary | ICD-10-CM

## 2011-12-26 DIAGNOSIS — I4891 Unspecified atrial fibrillation: Secondary | ICD-10-CM

## 2011-12-26 DIAGNOSIS — Z48812 Encounter for surgical aftercare following surgery on the circulatory system: Secondary | ICD-10-CM

## 2011-12-31 ENCOUNTER — Encounter: Payer: Self-pay | Admitting: *Deleted

## 2011-12-31 DIAGNOSIS — Z950 Presence of cardiac pacemaker: Secondary | ICD-10-CM | POA: Insufficient documentation

## 2012-01-02 ENCOUNTER — Encounter: Payer: Self-pay | Admitting: Internal Medicine

## 2012-01-02 ENCOUNTER — Ambulatory Visit (INDEPENDENT_AMBULATORY_CARE_PROVIDER_SITE_OTHER): Payer: Medicare Other | Admitting: *Deleted

## 2012-01-02 DIAGNOSIS — I442 Atrioventricular block, complete: Secondary | ICD-10-CM

## 2012-01-02 LAB — PACEMAKER DEVICE OBSERVATION
AL AMPLITUDE: 2.9 mv
BAMS-0001: 150 {beats}/min
BATTERY VOLTAGE: 2.993 V
RV LEAD AMPLITUDE: 3.1 mv

## 2012-01-02 NOTE — Progress Notes (Signed)
Wound check-PPM 

## 2012-01-06 ENCOUNTER — Ambulatory Visit (INDEPENDENT_AMBULATORY_CARE_PROVIDER_SITE_OTHER): Payer: Medicare Other | Admitting: Cardiology

## 2012-01-06 ENCOUNTER — Encounter: Payer: Self-pay | Admitting: Cardiology

## 2012-01-06 VITALS — BP 146/71 | HR 79 | Ht 59.0 in | Wt 131.0 lb

## 2012-01-06 DIAGNOSIS — I272 Pulmonary hypertension, unspecified: Secondary | ICD-10-CM

## 2012-01-06 DIAGNOSIS — Z950 Presence of cardiac pacemaker: Secondary | ICD-10-CM

## 2012-01-06 DIAGNOSIS — I2789 Other specified pulmonary heart diseases: Secondary | ICD-10-CM

## 2012-01-06 DIAGNOSIS — I509 Heart failure, unspecified: Secondary | ICD-10-CM | POA: Insufficient documentation

## 2012-01-06 DIAGNOSIS — I214 Non-ST elevation (NSTEMI) myocardial infarction: Secondary | ICD-10-CM

## 2012-01-06 DIAGNOSIS — I2510 Atherosclerotic heart disease of native coronary artery without angina pectoris: Secondary | ICD-10-CM

## 2012-01-06 DIAGNOSIS — I10 Essential (primary) hypertension: Secondary | ICD-10-CM

## 2012-01-06 DIAGNOSIS — I779 Disorder of arteries and arterioles, unspecified: Secondary | ICD-10-CM

## 2012-01-06 NOTE — Patient Instructions (Addendum)
Your physician recommends that you schedule a follow-up appointment in: 1 year with Hochrein after echo is complete. You will receive a reminder letter in the mail in about 10 months reminding you to call and schedule your appointment. If you don't receive this letter, please contact our office.  Your physician recommends that you continue on your current medications as directed. Please refer to the Current Medication list given to you today.  You have been referred to [facility]. Your physician has requested that you have an echocardiogram in 1 year. Echocardiography is a painless test that uses sound waves to create images of your heart. It provides your doctor with information about the size and shape of your heart and how well your heart's chambers and valves are working. This procedure takes approximately one hour. There are no restrictions for this procedure.

## 2012-01-06 NOTE — Progress Notes (Signed)
HPI The patient presents for followup after pacemaker placement. She had syncope earlier this summer. She was wearing an event monitor when I got a phone call one Friday evening and reviewed strips demonstrating complete heart block. She was not having any symptoms at that point. She did present for pacemaker placement and had this placed without problem. Since that time she has done well. She denies any presyncope or syncope. She denies any palpitations. She has had no chest pressure, neck or arm discomfort. She has had no weight gain or edema. She denies any PND or orthopnea. She has some chronic dyspnea and uses O2 at night.  Allergies  Allergen Reactions  . Latex   . Penicillins Itching and Rash    Current Outpatient Prescriptions  Medication Sig Dispense Refill  . aspirin 81 MG tablet Take 81 mg by mouth daily.      . Calcium Carbonate-Vitamin D (CALTRATE 600+D) 600-400 MG-UNIT per tablet Take 1 tablet by mouth daily.       Marland Kitchen dexlansoprazole (DEXILANT) 60 MG capsule Take 60 mg by mouth daily.      . ferrous sulfate 325 (65 FE) MG tablet Take 325 mg by mouth daily. 1 time a day      . furosemide (LASIX) 20 MG tablet Take 20 mg by mouth daily.          Past Medical History  Diagnosis Date  . CHF (congestive heart failure)     2D echo showed mild LVH and  hyperdynamic Left  ventricle with EF > 65%  . Pulmonary hypertension, mild   . Pericardial effusion     small pericardial effusion without evidence of amponade  . Hypertension   . Anemia     Admitted with severe anemia hemoglobin 5.8 colonoscopy pending  . Rheumatic heart disease      mild mitral stenosis and mild mitral regurgitation no personal history of rheumatic fever but history of rheumatic fever and sister   . Subendocardial infarction     In the setting of severe anemia. Mild troponin leak  . Carotid artery disease     Bilateral 70% ICA stenosis  . COPD (chronic obstructive pulmonary disease)   . Sleep apnea      Past Surgical History  Procedure Date  . Abdominal hysterectomy   . Breast lumpectomy   . Givens capsule study 01/02/2011    Procedure: GIVENS CAPSULE STUDY;  Surgeon: Malissa Hippo, MD;  Location: [facility];  Service: Endoscopy;  Laterality: N/A;  7:30 am    ROS:  As stated in the HPI and negative for all other systems.  PHYSICAL EXAM BP 146/71  Pulse 79  Ht 4\' 11"  (1.499 m)  Wt 59.421 kg (131 lb)  BMI 26.46 kg/m2 GENERAL:  Well appearing HEENT:  Pupils equal round and reactive, fundi not visualized, oral mucosa unremarkable NECK:  No jugular venous distention, waveform within normal limits, carotid upstroke brisk and symmetric, no bruits, no thyromegaly LYMPHATICS:  No cervical, inguinal adenopathy LUNGS:  Clear to auscultation bilaterally BACK:  No CVA tenderness CHEST:  Pacemaker pocket well healed.  HEART:  PMI not displaced or sustained,S1 and S2 within normal limits, no S3, no S4, no clicks, no rubs, 3/6 apical systolic murmur radiating to the axilla and holosystolic radiating slightly to the out flow tract and carotiD ABD:  Flat, positive bowel sounds normal in frequency in pitch, no bruits, no rebound, no guarding, no midline pulsatile mass, no hepatomegaly, no splenomegaly EXT:  2 plus pulses throughout, no edema, no cyanosis no clubbing SKIN:  No rashes no nodules NEURO:  Cranial nerves II through XII grossly intact, motor grossly intact throughout [facility]:  Cognitively intact, oriented to person place and time'  EKG:   Normal sinus rhythm, right bundle branch block, rate 79, axis within normal limits, intervals within normal limits, no acute ST-T wave changes. 01/06/2012   ASSESSMENT AND PLAN  CHB She is now status post pacemaker. She will have followup with Dr. Ladona Ridgel in [place].  Mod MR  The patient had moderate mitral regurgitation. Of note she did not have a clear history of rheumatic heart disease as a child. My plan is to follow this with an echo in  one year.  PVD  She has carotid stenosis. By CT this as listed is near occlusive on the right and greater than 60% on the left. However, carotid Doppler suggests 60-79% on the right. She will have followup with Dr. Edilia Bo to discuss further imaging or the timing of continued followup.  COPD  She wears home O2. No change in therapy is indicated.  HTN  The blood pressure is at target. No change in medications is indicated. We will continue with therapeutic lifestyle changes (TLC).

## 2012-02-05 ENCOUNTER — Ambulatory Visit: Payer: Medicare Other | Admitting: Vascular Surgery

## 2012-02-11 ENCOUNTER — Encounter: Payer: Self-pay | Admitting: Vascular Surgery

## 2012-02-12 ENCOUNTER — Ambulatory Visit (INDEPENDENT_AMBULATORY_CARE_PROVIDER_SITE_OTHER): Payer: Medicare Other | Admitting: Vascular Surgery

## 2012-02-12 ENCOUNTER — Encounter: Payer: Self-pay | Admitting: Vascular Surgery

## 2012-02-12 VITALS — BP 155/80 | HR 85 | Resp 16 | Ht 59.0 in | Wt 136.0 lb

## 2012-02-12 DIAGNOSIS — I6529 Occlusion and stenosis of unspecified carotid artery: Secondary | ICD-10-CM

## 2012-02-12 DIAGNOSIS — I779 Disorder of arteries and arterioles, unspecified: Secondary | ICD-10-CM

## 2012-02-12 NOTE — Assessment & Plan Note (Signed)
CT angio suggests a very tight proximal right internal carotid artery stenosis. However the duplex suggests a 60-79% R ICA stenosis in the low and of that range. By my interpretation of the CT MG there is a small kink at the level of concern. As the patient is asymptomatic I would not recommend surgery. Likewise I would not recommend cerebral arteriography given the small risk of stroke associated with this. I have ordered a follow up carotid duplex scan in 6 months and I'll see her back at that time. She knows to call sooner she has problems. We have reviewed the potential symptoms of cerebrovascular disease. In addition she knows to continue taking her aspirin.

## 2012-02-12 NOTE — Progress Notes (Signed)
[facility] [place]  Patient name: Felicia Diaz MRN: [id number] DOB: 07/07/1932 Sex: female  REASON FOR VISIT: follow up of carotid disease.  HPI: Felicia Diaz is a 78 y.o. female 1 saw in consultation in May of 2012 because of a possible carotid stenosis. Duplex scan at [facility] suggested bilateral carotid disease or possibly 70%. However duplex scan in our office did not show significant stenosis although the arteries were somewhat tortuous which could explain the elevated velocities seen.  This patient had problems with syncope during the summer and ultimately had a pacemaker placed in September of this year. Part of her workup for this included a CT angiogram neck which was done on 11/27/2011 at [facility] which suggested a high-grade near occlusive stenosis of the proximal right internal carotid artery with no significant stenosis on the left. Subsequent carotid duplex scan done at the [facility] office on 10/11/2011 showed evidence of a 60-79% right carotid stenosis in the lower end of that range of 40-59% left carotid stenosis also in the lower end of that range.  As I saw her last she's had no history of stroke, TIAs, expressive or receptive aphasia, or amaurosis fugax. He has been doing well since her pacemaker was placed.  Past Medical History  Diagnosis Date  . CHF (congestive heart failure)     2D echo showed mild LVH and  hyperdynamic Left  ventricle with EF > 65%  . Pulmonary hypertension, mild   . Pericardial effusion     small pericardial effusion without evidence of amponade  . Hypertension   . Anemia     Admitted with severe anemia hemoglobin 5.8 colonoscopy pending  . Mitral valve regurgitation      mild mitral stenosis and mild mitral regurgitation no personal history of rheumatic fever but history of rheumatic fever and sister   . Subendocardial infarction     In the setting of severe anemia. Mild troponin leak  . Carotid artery  disease     Bilateral 70% ICA stenosis  . COPD (chronic obstructive pulmonary disease)   . Sleep apnea   . CHB (complete heart block)     St Jude Pacemaker  . CAD (coronary artery disease)     Family History  Problem Relation Age of Onset  . Colon cancer Father     died age 17  . Healthy Brother   . Cancer Brother   . Heart disease Sister   . Cancer Mother     SOCIAL HISTORY: History  Substance Use Topics  . Smoking status: Former Smoker -- 0.5 packs/day for 55 years    Types: Cigarettes    Quit date: 07/17/2010  . Smokeless tobacco: Never Used  . Alcohol Use: No    Allergies  Allergen Reactions  . Latex   . Penicillins Itching and Rash    Current Outpatient Prescriptions  Medication Sig Dispense Refill  . aspirin 81 MG tablet Take 81 mg by mouth daily.      . Calcium Carbonate-Vitamin D (CALTRATE 600+D) 600-400 MG-UNIT per tablet Take 1 tablet by mouth daily.       Marland Kitchen dexlansoprazole (DEXILANT) 60 MG capsule Take 60 mg by mouth daily.      . ferrous sulfate 325 (65 FE) MG tablet Take 325 mg by mouth daily. 1 time a day      . furosemide (LASIX) 20 MG tablet Take 20 mg by mouth daily.  REVIEW OF SYSTEMS: Arly.Keller ] denotes positive finding; [  ] denotes negative finding  CARDIOVASCULAR:  [ ]  chest pain   [ ]  chest pressure   [ ]  palpitations   [ ]  orthopnea   [ ]  dyspnea on exertion   [ ]  claudication   [ ]  rest pain   [ ]  DVT   [ ]  phlebitis PULMONARY:   [ ]  productive cough   [ ]  asthma   [ ]  wheezing NEUROLOGIC:   [ ]  weakness  [ ]  paresthesias  [ ]  aphasia  [ ]  amaurosis  [ ]  dizziness HEMATOLOGIC:   [ ]  bleeding problems   [ ]  clotting disorders MUSCULOSKELETAL:  [ ]  joint pain   [ ]  joint swelling [ ]  leg swelling GASTROINTESTINAL: [ ]   blood in stool  [ ]   hematemesis GENITOURINARY:  [ ]   dysuria  [ ]   hematuria PSYCHIATRIC:  [ ]  history of major depression INTEGUMENTARY:  [ ]  rashes  [ ]  ulcers CONSTITUTIONAL:  [ ]  fever   [ ]  chills  PHYSICAL  EXAM: Filed Vitals:   02/12/12 1442 02/12/12 1444  BP: 177/78 155/80  Pulse: 85   Resp: 16   Height: 4\' 11"  (1.499 m)   Weight: 136 lb (61.689 kg)   SpO2: 98%    Body mass index is 27.47 kg/(m^2). GENERAL: The patient is a well-nourished female, in no acute distress. The vital signs are documented above. CARDIOVASCULAR: There is a regular rate and rhythm. I do not detect carotid bruits. PULMONARY: There is good air exchange bilaterally without wheezing or rales. ABDOMEN: Soft and non-tender with normal pitched bowel sounds.  MUSCULOSKELETAL: There are no major deformities or cyanosis. NEUROLOGIC: No focal weakness or paresthesias are detected. SKIN: There are no ulcers or rashes noted. PSYCHIATRIC: The patient has a normal affect.  DATA:  I have reviewed her CT Angio that was done at [facility]. The area of concern on the right appears to be a small kink. I do not see significant plaque.  Have reviewed the duplex scan done at the [facility] office. Based on the velocity criteria I would say that the stenosis on the right is in the lower end of the 60-79% range, closer to 60%. There is no significant stenosis on the left.  MEDICAL ISSUES:  Carotid artery disease CT angio suggests a very tight proximal right internal carotid artery stenosis. However the duplex suggests a 60-79% R ICA stenosis in the low and of that range. By my interpretation of the CT MG there is a small kink at the level of concern. As the patient is asymptomatic I would not recommend surgery. Likewise I would not recommend cerebral arteriography given the small risk of stroke associated with this. I have ordered a follow up carotid duplex scan in 6 months and I'll see her back at that time. She knows to call sooner she has problems. We have reviewed the potential symptoms of cerebrovascular disease. In addition she knows to continue taking her aspirin.   DICKSON,CHRISTOPHER S [facility]  [place] Beeper: [phone]

## 2012-02-13 NOTE — Addendum Note (Signed)
Addended by: Sharee Pimple on: 02/13/2012 10:34 AM   Modules accepted: Orders

## 2012-02-17 ENCOUNTER — Telehealth: Payer: Self-pay | Admitting: Internal Medicine

## 2012-02-17 NOTE — Telephone Encounter (Signed)
New problem:   Status of plan of care document that was fax over.

## 2012-02-17 NOTE — Telephone Encounter (Signed)
They have been given to Dr Antoine Poche the patient's primary Cardiologist  [facility] aware

## 2012-02-18 ENCOUNTER — Ambulatory Visit: Payer: Medicare Other | Admitting: Cardiology

## 2012-03-26 ENCOUNTER — Encounter (INDEPENDENT_AMBULATORY_CARE_PROVIDER_SITE_OTHER): Payer: Self-pay

## 2012-03-31 ENCOUNTER — Encounter: Payer: Medicare Other | Admitting: Internal Medicine

## 2012-04-06 ENCOUNTER — Encounter: Payer: Self-pay | Admitting: Internal Medicine

## 2012-04-06 ENCOUNTER — Ambulatory Visit (INDEPENDENT_AMBULATORY_CARE_PROVIDER_SITE_OTHER): Payer: Medicare Other | Admitting: Internal Medicine

## 2012-04-06 VITALS — BP 160/78 | HR 82 | Wt 142.0 lb

## 2012-04-06 DIAGNOSIS — I10 Essential (primary) hypertension: Secondary | ICD-10-CM

## 2012-04-06 DIAGNOSIS — IMO0001 Reserved for inherently not codable concepts without codable children: Secondary | ICD-10-CM

## 2012-04-06 DIAGNOSIS — Z950 Presence of cardiac pacemaker: Secondary | ICD-10-CM

## 2012-04-06 DIAGNOSIS — I442 Atrioventricular block, complete: Secondary | ICD-10-CM | POA: Insufficient documentation

## 2012-04-06 LAB — PACEMAKER DEVICE OBSERVATION
AL AMPLITUDE: 2.1 mv
AL THRESHOLD: 0.75 V
BAMS-0001: 150 {beats}/min
DEVICE MODEL PM: 7382911
RV LEAD IMPEDENCE PM: 512.5 Ohm
RV LEAD THRESHOLD: 0.625 V
VENTRICULAR PACING PM: 73

## 2012-04-06 MED ORDER — CARVEDILOL PHOSPHATE ER 10 MG PO CP24: 10.0000 mg | ORAL_CAPSULE | Freq: Every day | ORAL | Status: DC

## 2012-04-06 NOTE — Assessment & Plan Note (Signed)
Her systolic blood pressure is very elevated today. I have recommended she start low dose coreg CR. I have asked her to followup with her primary MD for dose uptitration.

## 2012-04-06 NOTE — Assessment & Plan Note (Signed)
Her St. Jude DDD PPM is working normally. She is pacing 73% of the time.

## 2012-04-06 NOTE — Patient Instructions (Addendum)
Your physician recommends that you schedule a follow-up appointment in: September 2014 WITH [facility]  Your physician has recommended you make the following change in your medication:   1) START COREG CR 10MG  ONE TABLET DAILY

## 2012-04-06 NOTE — Progress Notes (Signed)
HPI Felicia Diaz returns today for followup. She is a pleasant 79 yo woman with a h/o symptomatic bradycardia, s/p PPM 3 months ago. In the interim she has been stable. She denies chest pain or sob. Minimal incisional tenderness. No edema. No syncope. Allergies  Allergen Reactions  . Latex   . Penicillins Itching and Rash     Current Outpatient Prescriptions  Medication Sig Dispense Refill  . aspirin 81 MG tablet Take 81 mg by mouth daily.      . Calcium Carbonate-Vitamin D (CALTRATE 600+D) 600-400 MG-UNIT per tablet Take 1 tablet by mouth daily.       Marland Kitchen dexlansoprazole (DEXILANT) 60 MG capsule Take 60 mg by mouth daily.      . ferrous sulfate 325 (65 FE) MG tablet Take 325 mg by mouth daily. 1 time a day      . furosemide (LASIX) 20 MG tablet Take 20 mg by mouth daily.        . carvedilol (COREG CR) 10 MG 24 hr capsule Take 1 capsule (10 mg total) by mouth daily.  30 capsule  8     Past Medical History  Diagnosis Date  . CHF (congestive heart failure)     2D echo showed mild LVH and  hyperdynamic Left  ventricle with EF > 65%  . Pulmonary hypertension, mild   . Pericardial effusion     small pericardial effusion without evidence of amponade  . Hypertension   . Anemia     Admitted with severe anemia hemoglobin 5.8 colonoscopy pending  . Mitral valve regurgitation      mild mitral stenosis and mild mitral regurgitation no personal history of rheumatic fever but history of rheumatic fever and sister   . Subendocardial infarction     In the setting of severe anemia. Mild troponin leak  . Carotid artery disease     Bilateral 70% ICA stenosis  . COPD (chronic obstructive pulmonary disease)   . Sleep apnea   . CHB (complete heart block)     St Jude Pacemaker  . CAD (coronary artery disease)   . Atrioventricular block, complete     ROS:   All systems reviewed and negative except as noted in the HPI.   Past Surgical History  Procedure Date  . Abdominal hysterectomy   .  Breast lumpectomy   . Givens capsule study 01/02/2011    Procedure: GIVENS CAPSULE STUDY;  Surgeon: Malissa Hippo, MD;  Location: [facility];  Service: Endoscopy;  Laterality: N/A;  7:30 am  . Tonsillectomy and adenoidectomy   . Pacemaker placement 12-23-2011     Family History  Problem Relation Age of Onset  . Colon cancer Father     died age 28  . Healthy Brother   . Cancer Brother   . Heart disease Sister   . Cancer Mother      History   Social History  . Marital Status: Widowed    Spouse Name: N/A    Number of Children: N/A  . Years of Education: N/A   Occupational History  . Not on file.   Social History Main Topics  . Smoking status: Former Smoker -- 0.5 packs/day for 55 years    Types: Cigarettes    Quit date: 07/17/2010  . Smokeless tobacco: Never Used  . Alcohol Use: No  . Drug Use: No  . Sexually Active: Not on file   Other Topics Concern  . Not on file   Social History Narrative  WidowLives alone/currently staying at [facility] has a very close friend who she speaks with on a regular basis whom is very involved in her life     BP 160/78  Pulse 82  Wt 142 lb (64.411 kg)  SpO2 94%  Physical Exam:  Well appearing 79 yo woman, NAD HEENT: Unremarkable Neck:  No JVD, no thyromegally Lungs:  Clear with no wheezes, rales, or rhonchi, well healed PPM incision. HEART:  Regular rate rhythm, no murmurs, no rubs, no clicks Abd:  soft, positive bowel sounds, no organomegally, no rebound, no guarding Ext:  2 plus pulses, no edema, no cyanosis, no clubbing Skin:  No rashes no nodules Neuro:  CN II through XII intact, motor grossly intact  DEVICE  Normal device function.  See PaceArt for details.   Assess/Plan:

## 2012-04-07 ENCOUNTER — Other Ambulatory Visit: Payer: Self-pay | Admitting: *Deleted

## 2012-04-07 NOTE — Telephone Encounter (Signed)
[facility] [place], [employer] pharmacist, Heather called and stated pt's co-pay for carvedilol (COREG CR) 10 MG qd is $78 a month and the co-pay for carvedilol (COREG) 3.125 bid $25 per month.  Please call the pharmacist Herbert Seta back at # [phone] and advise if the change is acceptable.  Caralee Ates, CMA

## 2012-04-09 NOTE — Telephone Encounter (Signed)
Spoke with [facility] and approved the change from Coreg CR to Coreg 3.125 mg -- Let them know it is $4.00 at [place], they will try and match the price if they can

## 2012-04-13 ENCOUNTER — Encounter: Payer: Self-pay | Admitting: Internal Medicine

## 2012-08-11 ENCOUNTER — Encounter: Payer: Self-pay | Admitting: Vascular Surgery

## 2012-08-12 ENCOUNTER — Ambulatory Visit (INDEPENDENT_AMBULATORY_CARE_PROVIDER_SITE_OTHER): Payer: Medicare Other | Admitting: Vascular Surgery

## 2012-08-12 ENCOUNTER — Other Ambulatory Visit (INDEPENDENT_AMBULATORY_CARE_PROVIDER_SITE_OTHER): Payer: Medicare Other | Admitting: *Deleted

## 2012-08-12 ENCOUNTER — Encounter: Payer: Self-pay | Admitting: Vascular Surgery

## 2012-08-12 DIAGNOSIS — I6529 Occlusion and stenosis of unspecified carotid artery: Secondary | ICD-10-CM

## 2012-08-12 NOTE — Addendum Note (Signed)
Addended by: Sharee Pimple on: 08/12/2012 02:49 PM   Modules accepted: Orders

## 2012-08-12 NOTE — Progress Notes (Signed)
[facility] [place]  Patient name: Felicia Diaz MRN: [id number] DOB: [birth year] Sex: female  REASON FOR VISIT: follow up of carotid disease  HPI: Felicia Diaz is a 79 y.o. female who I had seen in consultation in May of 2012 a possible right carotid stenosis. The patient had problems with syncope prior to this in workup included a CT angiogram that was done at [facility] on 11/27/2011 suggested a possible stenosis of the proximal right internal carotid artery. This appeared to be more of a kink at the stenosis by my interpretation and duplex scan in our office suggested a low end 60-79% stenosis which I felt was asymptomatic. She comes in for a 6 month follow up study.  As I saw her last, she denies any history of stroke, TIAs, expressive or receptive aphasia, or amaurosis fugax. She is currently on 81 mg of aspirin a day. She is not a smoker currently. She is currently not on a statin.  Past Medical History  Diagnosis Date  . CHF (congestive heart failure)     2D echo showed mild LVH and  hyperdynamic Left  ventricle with EF > 65%  . Pulmonary hypertension, mild   . Pericardial effusion     small pericardial effusion without evidence of amponade  . Hypertension   . Anemia     Admitted with severe anemia hemoglobin 5.8 colonoscopy pending  . Mitral valve regurgitation      mild mitral stenosis and mild mitral regurgitation no personal history of rheumatic fever but history of rheumatic fever and sister   . Subendocardial infarction     In the setting of severe anemia. Mild troponin leak  . Carotid artery disease     Bilateral 70% ICA stenosis  . COPD (chronic obstructive pulmonary disease)   . Sleep apnea   . CHB (complete heart block)     St Jude Pacemaker  . CAD (coronary artery disease)   . Atrioventricular block, complete    Family History  Problem Relation Age of Onset  . Colon cancer Father     died age 53  . Cancer Father   . Healthy  Brother   . Cancer Brother   . Heart disease Sister   . Heart disease Mother    SOCIAL HISTORY: History  Substance Use Topics  . Smoking status: Former Smoker -- 0.50 packs/day for 55 years    Types: Cigarettes    Quit date: 07/17/2010  . Smokeless tobacco: Never Used  . Alcohol Use: No   Allergies  Allergen Reactions  . Latex   . Penicillins Itching and Rash   Current Outpatient Prescriptions  Medication Sig Dispense Refill  . amLODipine (NORVASC) 2.5 MG tablet Take 1 tablet by mouth daily.      Marland Kitchen aspirin 81 MG tablet Take 81 mg by mouth daily.      . Calcium Carbonate-Vitamin D (CALTRATE 600+D) 600-400 MG-UNIT per tablet Take 1 tablet by mouth daily.       Marland Kitchen dexlansoprazole (DEXILANT) 60 MG capsule Take 60 mg by mouth daily.      . furosemide (LASIX) 20 MG tablet Take 20 mg by mouth daily.        . carvedilol (COREG CR) 10 MG 24 hr capsule Take 1 capsule (10 mg total) by mouth daily.  30 capsule  8  . ferrous sulfate 325 (65 FE) MG tablet Take 325 mg by mouth daily. 1 time a day  No current facility-administered medications for this visit.   REVIEW OF SYSTEMS: Arly.Keller ] denotes positive finding; [  ] denotes negative finding  CARDIOVASCULAR:  [ ]  chest pain   [ ]  chest pressure   [ ]  palpitations   [ ]  orthopnea   Arly.Keller ] dyspnea on exertion   [ ]  claudication   [ ]  rest pain   [ ]  DVT   [ ]  phlebitis PULMONARY:   [ ]  productive cough   [ ]  asthma   Arly.Keller ] wheezing NEUROLOGIC:   [ ]  weakness  [ ]  paresthesias  [ ]  aphasia  [ ]  amaurosis  [ ]  dizziness HEMATOLOGIC:   [ ]  bleeding problems   [ ]  clotting disorders MUSCULOSKELETAL:  [ ]  joint pain   [ ]  joint swelling [ ]  leg swelling GASTROINTESTINAL: [ ]   blood in stool  [ ]   hematemesis GENITOURINARY:  [ ]   dysuria  [ ]   hematuria PSYCHIATRIC:  [ ]  history of major depression INTEGUMENTARY:  [ ]  rashes  [ ]  ulcers CONSTITUTIONAL:  [ ]  fever   [ ]  chills  PHYSICAL EXAM: Filed Vitals:   08/12/12 1406 08/12/12 1411  BP:  154/55 147/61  Pulse: 78   Height: 4\' 11"  (1.499 m)   Weight: 142 lb (64.411 kg)   SpO2: 96%    Body mass index is 28.67 kg/(m^2). GENERAL: The patient is a well-nourished female, in no acute distress. The vital signs are documented above. CARDIOVASCULAR: There is a regular rate and rhythm. Do not detect carotid bruits. PULMONARY: There is good air exchange bilaterally without wheezing or rales. ABDOMEN: Soft and non-tender with normal pitched bowel sounds.  MUSCULOSKELETAL: There are no major deformities or cyanosis. NEUROLOGIC: No focal weakness or paresthesias are detected. SKIN: There are no ulcers or rashes noted. PSYCHIATRIC: The patient has a normal affect.  DATA:  I have individually interpreted her carotid duplex scan which shows a less than 40% carotid stenosis bilaterally. Both vertebral arteries are patent with normally directed flow.  MEDICAL ISSUES: This patient has no evidence of significant carotid disease bilaterally. She is asymptomatic. I think it is safe to stretch her followed out to one year and I ordered a fall carotid duplex scan for 1 year. She knows to call sooner she has problems. In the meantime she knows to continue taking her aspirin.  Reyan Helle S [facility] [place] Beeper: [phone]

## 2012-10-13 ENCOUNTER — Telehealth: Payer: Self-pay | Admitting: *Deleted

## 2012-12-15 ENCOUNTER — Encounter: Payer: Self-pay | Admitting: Internal Medicine

## 2012-12-15 ENCOUNTER — Ambulatory Visit (INDEPENDENT_AMBULATORY_CARE_PROVIDER_SITE_OTHER): Payer: Medicare Other | Admitting: Internal Medicine

## 2012-12-15 VITALS — BP 137/70 | HR 81 | Ht 59.0 in | Wt 146.0 lb

## 2012-12-15 DIAGNOSIS — I10 Essential (primary) hypertension: Secondary | ICD-10-CM

## 2012-12-15 DIAGNOSIS — I442 Atrioventricular block, complete: Secondary | ICD-10-CM

## 2012-12-15 LAB — PACEMAKER DEVICE OBSERVATION
BAMS-0001: 150 {beats}/min
BAMS-0003: 70 {beats}/min
BATTERY VOLTAGE: 2.9779 V
RV LEAD AMPLITUDE: 12 mv
RV LEAD THRESHOLD: 0.75 V
VENTRICULAR PACING PM: 100

## 2012-12-15 NOTE — Patient Instructions (Addendum)
Your physician recommends that you schedule a follow-up appointment in: 1 year with Dr Ladona Ridgel and [facility] transmitter on 03/22/13

## 2012-12-15 NOTE — Assessment & Plan Note (Signed)
Her blood pressure appears to be well-controlled. I've encouraged the patient to lose weight, and recommended that she start exercising daily. She has no limitation to her activity now that she is one year out from her pacemaker implantation.

## 2012-12-15 NOTE — Progress Notes (Signed)
HPI [name] returns today for followup. She is a very pleasant 79 year old woman with complete heart block, status post permanent pacemaker insertion. She has hypertension. In the interim, she has done well, except she is concerned about the difficulty she has losing weight. She admits to both dietary indiscretion and a sedentary lifestyle. She was concerned about dislodging her pacemaker leads. Allergies  Allergen Reactions  . Latex   . Penicillins Itching and Rash     Current Outpatient Prescriptions  Medication Sig Dispense Refill  . amLODipine (NORVASC) 2.5 MG tablet Take 1 tablet by mouth daily.      Marland Kitchen aspirin 81 MG tablet Take 81 mg by mouth daily.      Marland Kitchen dexlansoprazole (DEXILANT) 60 MG capsule Take 60 mg by mouth daily.      . furosemide (LASIX) 20 MG tablet Take 20 mg by mouth daily.         No current facility-administered medications for this visit.     Past Medical History  Diagnosis Date  . CHF (congestive heart failure)     2D echo showed mild LVH and  hyperdynamic Left  ventricle with EF > 65%  . Pulmonary hypertension, mild   . Pericardial effusion     small pericardial effusion without evidence of amponade  . Hypertension   . Anemia     Admitted with severe anemia hemoglobin 5.8 colonoscopy pending  . Mitral valve regurgitation      mild mitral stenosis and mild mitral regurgitation no personal history of rheumatic fever but history of rheumatic fever and sister   . Subendocardial infarction     In the setting of severe anemia. Mild troponin leak  . Carotid artery disease     Bilateral 70% ICA stenosis  . COPD (chronic obstructive pulmonary disease)   . Sleep apnea   . CHB (complete heart block)     St Jude Pacemaker  . CAD (coronary artery disease)   . Atrioventricular block, complete     ROS:   All systems reviewed and negative except as noted in the HPI.   Past Surgical History  Procedure Laterality Date  . Abdominal hysterectomy    . Breast  lumpectomy    . Givens capsule study  01/02/2011    Procedure: GIVENS CAPSULE STUDY;  Surgeon: Malissa Hippo, MD;  Location: [facility];  Service: Endoscopy;  Laterality: N/A;  7:30 am  . Tonsillectomy and adenoidectomy    . Pacemaker placement  12-23-2011     Family History  Problem Relation Age of Onset  . Colon cancer Father     died age 55  . Cancer Father   . Healthy Brother   . Cancer Brother   . Heart disease Sister   . Heart disease Mother      History   Social History  . Marital Status: Widowed    Spouse Name: N/A    Number of Children: N/A  . Years of Education: N/A   Occupational History  . Not on file.   Social History Main Topics  . Smoking status: Former Smoker -- 0.50 packs/day for 55 years    Types: Cigarettes    Quit date: 07/17/2010  . Smokeless tobacco: Never Used  . Alcohol Use: No  . Drug Use: No  . Sexual Activity: Not on file   Other Topics Concern  . Not on file   Social History Narrative   Widow   Lives alone/currently staying at [facility]  She has a very close friend who she speaks with on a regular basis whom is very involved in her life     BP 137/70  Pulse 81  Ht 4\' 11"  (1.499 m)  Wt 146 lb (66.225 kg)  BMI 29.47 kg/m2  Physical Exam:  Well appearing 79 year old woman, NAD HEENT: Unremarkable Neck:  No JVD, no thyromegally Back:  No CVA tenderness Lungs:  Clear with no wheezes, rales, or rhonchi. Well-healed pacemaker incision. HEART:  Regular rate rhythm, no murmurs, no rubs, no clicks Abd:  soft, positive bowel sounds, no organomegally, no rebound, no guarding Ext:  2 plus pulses, no edema, no cyanosis, no clubbing Skin:  No rashes no nodules Neuro:  CN II through XII intact, motor grossly intact   DEVICE  Normal device function.  See PaceArt for details.   Assess/Plan:

## 2013-02-01 ENCOUNTER — Other Ambulatory Visit: Payer: Self-pay | Admitting: *Deleted

## 2013-02-01 DIAGNOSIS — I509 Heart failure, unspecified: Secondary | ICD-10-CM

## 2013-02-12 ENCOUNTER — Telehealth: Payer: Self-pay | Admitting: Internal Medicine

## 2013-02-12 NOTE — Telephone Encounter (Signed)
PATIENT declined scheduling follow up appointment with our office for Oct 2014. Said that Dr. Ladona Ridgel agreed to take care of all her needs.

## 2013-03-22 ENCOUNTER — Telehealth: Payer: Self-pay | Admitting: Internal Medicine

## 2013-03-22 ENCOUNTER — Ambulatory Visit (INDEPENDENT_AMBULATORY_CARE_PROVIDER_SITE_OTHER): Payer: Medicare Other | Admitting: *Deleted

## 2013-03-22 DIAGNOSIS — I442 Atrioventricular block, complete: Secondary | ICD-10-CM

## 2013-03-22 NOTE — Telephone Encounter (Signed)
Transmission received patient aware. 

## 2013-03-22 NOTE — Telephone Encounter (Signed)
New problem    Pt does not know what to do to transmit.   She is returning the call she got.  Please give her a call back and help her through.

## 2013-03-22 NOTE — Telephone Encounter (Signed)
Follow up    Pt has to leave home around 3 call her back before then please if possible.

## 2013-03-27 LAB — MDC_IDC_ENUM_SESS_TYPE_REMOTE
Battery Remaining Longevity: 113 mo
Brady Statistic AS VS Percent: 1 %
Brady Statistic RV Percent Paced: 99 %
Date Time Interrogation Session: 20141222084506
Lead Channel Pacing Threshold Amplitude: 0.75 V
Lead Channel Pacing Threshold Amplitude: 0.75 V
Lead Channel Pacing Threshold Pulse Width: 0.5 ms
Lead Channel Sensing Intrinsic Amplitude: 12 mV
Lead Channel Setting Pacing Amplitude: 1 V
Lead Channel Setting Pacing Pulse Width: 0.5 ms

## 2013-04-14 ENCOUNTER — Encounter: Payer: Self-pay | Admitting: *Deleted

## 2013-04-30 ENCOUNTER — Encounter: Payer: Self-pay | Admitting: Internal Medicine

## 2013-05-02 HISTORY — PX: BLADDER TUMOR EXCISION: SHX238

## 2013-05-18 ENCOUNTER — Other Ambulatory Visit: Payer: Self-pay | Admitting: Vascular Surgery

## 2013-05-18 DIAGNOSIS — I6529 Occlusion and stenosis of unspecified carotid artery: Secondary | ICD-10-CM

## 2013-06-23 ENCOUNTER — Ambulatory Visit (INDEPENDENT_AMBULATORY_CARE_PROVIDER_SITE_OTHER): Payer: Medicare Other | Admitting: *Deleted

## 2013-06-23 ENCOUNTER — Encounter: Payer: Self-pay | Admitting: Internal Medicine

## 2013-06-23 DIAGNOSIS — I442 Atrioventricular block, complete: Secondary | ICD-10-CM

## 2013-06-23 LAB — MDC_IDC_ENUM_SESS_TYPE_REMOTE
Battery Remaining Longevity: 113 mo
Brady Statistic RA Percent Paced: 1.1 %
Brady Statistic RV Percent Paced: 99 %
Date Time Interrogation Session: 20150325072040
Lead Channel Impedance Value: 350 Ohm
Lead Channel Pacing Threshold Amplitude: 0.75 V
Lead Channel Pacing Threshold Pulse Width: 0.5 ms
Lead Channel Sensing Intrinsic Amplitude: 1.6 mV
Lead Channel Setting Pacing Amplitude: 1 V
Lead Channel Setting Pacing Pulse Width: 0.5 ms
MDC IDC MSMT BATTERY VOLTAGE: 2.98 V
MDC IDC MSMT LEADCHNL RA PACING THRESHOLD PULSEWIDTH: 0.5 ms
MDC IDC MSMT LEADCHNL RV IMPEDANCE VALUE: 480 Ohm
MDC IDC MSMT LEADCHNL RV PACING THRESHOLD AMPLITUDE: 0.75 V
MDC IDC MSMT LEADCHNL RV SENSING INTR AMPL: 12 mV
MDC IDC PG SERIAL: 7382911
MDC IDC SET LEADCHNL RA PACING AMPLITUDE: 2 V
MDC IDC SET LEADCHNL RV SENSING SENSITIVITY: 2 mV
MDC IDC STAT BRADY AP VP PERCENT: 1.2 %
MDC IDC STAT BRADY AP VS PERCENT: 1 %
MDC IDC STAT BRADY AS VP PERCENT: 99 %
MDC IDC STAT BRADY AS VS PERCENT: 1 %

## 2013-07-15 ENCOUNTER — Encounter: Payer: Self-pay | Admitting: *Deleted

## 2013-08-10 ENCOUNTER — Encounter: Payer: Self-pay | Admitting: Vascular Surgery

## 2013-08-11 ENCOUNTER — Encounter (INDEPENDENT_AMBULATORY_CARE_PROVIDER_SITE_OTHER): Payer: Self-pay

## 2013-08-11 ENCOUNTER — Ambulatory Visit (HOSPITAL_COMMUNITY)
Admission: RE | Admit: 2013-08-11 | Discharge: 2013-08-11 | Disposition: A | Discharge status: Home / Self Care | Payer: Medicare Other | Source: Ambulatory Visit | Attending: Vascular Surgery | Admitting: Vascular Surgery

## 2013-08-11 ENCOUNTER — Encounter: Payer: Self-pay | Admitting: Vascular Surgery

## 2013-08-11 ENCOUNTER — Ambulatory Visit (INDEPENDENT_AMBULATORY_CARE_PROVIDER_SITE_OTHER): Payer: Medicare Other | Admitting: Vascular Surgery

## 2013-08-11 VITALS — BP 150/53 | HR 66 | Resp 16 | Ht 59.0 in | Wt 152.0 lb

## 2013-08-11 DIAGNOSIS — I6529 Occlusion and stenosis of unspecified carotid artery: Secondary | ICD-10-CM

## 2013-08-11 DIAGNOSIS — I739 Peripheral vascular disease, unspecified: Secondary | ICD-10-CM

## 2013-08-11 DIAGNOSIS — I779 Disorder of arteries and arterioles, unspecified: Secondary | ICD-10-CM

## 2013-08-11 NOTE — Progress Notes (Signed)
[facility] [place]  Patient name: Felicia Diaz MRN: [id number] DOB: 01/26/1933 Sex: female  REASON FOR VISIT: Follow up of carotid disease  HPI: Felicia Diaz is a 80 y.o. female who I last saw on 08/12/2012 with bilateral carotid disease. I had originally seen her in consultation in May 2012 a possible right carotid stenosis. The patient had an episode of syncope which prompted a CT angiogram which suggested a possible stenosis in the proximal right internal carotid artery. This appeared to be more of a kink been a stenosis in the duplex at that time suggested a low and 60-79% carotid stenosis which was asymptomatic. When I saw her last year her duplex showed less than 40% carotid stenoses bilaterally. I felt was safe to stretcher follow up out to one year. She comes in for a one year follow up visit.  Since I saw her last, she denies any history of stroke, TIAs, expressive or receptive aphasia, or amaurosis fugax. The only change in her medical history is that she was diagnosed with an early stage bladder cancer which has been treated.  She remains on aspirin.  Past Medical History  Diagnosis Date  . CHF (congestive heart failure)     2D echo showed mild LVH and  hyperdynamic Left  ventricle with EF > 65%  . Pulmonary hypertension, mild   . Pericardial effusion     small pericardial effusion without evidence of amponade  . Hypertension   . Anemia     Admitted with severe anemia hemoglobin 5.8 colonoscopy pending  . Mitral valve regurgitation      mild mitral stenosis and mild mitral regurgitation no personal history of rheumatic fever but history of rheumatic fever and sister   . Subendocardial infarction     In the setting of severe anemia. Mild troponin leak  . Carotid artery disease     Bilateral 70% ICA stenosis  . COPD (chronic obstructive pulmonary disease)   . Sleep apnea   . CHB (complete heart block)     St Jude Pacemaker  . CAD (coronary  artery disease)   . Atrioventricular block, complete    Family History  Problem Relation Age of Onset  . Colon cancer Father     died age 72  . Cancer Father   . Healthy Brother   . Cancer Brother   . Heart disease Sister   . Heart disease Mother    SOCIAL HISTORY: History  Substance Use Topics  . Smoking status: Former Smoker -- 0.50 packs/day for 55 years    Types: Cigarettes    Quit date: 07/17/2010  . Smokeless tobacco: Never Used  . Alcohol Use: No   Allergies  Allergen Reactions  . Latex   . Penicillins Itching and Rash   Current Outpatient Prescriptions  Medication Sig Dispense Refill  . amLODipine (NORVASC) 2.5 MG tablet Take 1 tablet by mouth daily.      Marland Kitchen aspirin 81 MG tablet Take 81 mg by mouth daily.      Marland Kitchen dexlansoprazole (DEXILANT) 60 MG capsule Take 60 mg by mouth daily.      . furosemide (LASIX) 20 MG tablet Take 20 mg by mouth daily.         No current facility-administered medications for this visit.   REVIEW OF SYSTEMS: Valu.Nieves ] denotes positive finding; [  ] denotes negative finding  CARDIOVASCULAR:  [ ]  chest pain   [ ]  chest pressure   [ ]   palpitations   [ ]  orthopnea   [ ]  dyspnea on exertion   [ ]  claudication   [ ]  rest pain   [ ]  DVT   [ ]  phlebitis PULMONARY:   [ ]  productive cough   [ ]  asthma   [ ]  wheezing NEUROLOGIC:   [ ]  weakness  [ ]  paresthesias  [ ]  aphasia  [ ]  amaurosis  [ ]  dizziness HEMATOLOGIC:   [ ]  bleeding problems   [ ]  clotting disorders MUSCULOSKELETAL:  [ ]  joint pain   [ ]  joint swelling [ ]  leg swelling GASTROINTESTINAL: [ ]   blood in stool  [ ]   hematemesis GENITOURINARY:  [ ]   dysuria  [ ]   hematuria PSYCHIATRIC:  [ ]  history of major depression INTEGUMENTARY:  [ ]  rashes  [ ]  ulcers CONSTITUTIONAL:  [ ]  fever   [ ]  chills  PHYSICAL EXAM: Filed Vitals:   08/11/13 1359 08/11/13 1401  BP: 143/72 150/53  Pulse: 83 66  Resp: 16   Height: 4\' 11"  (1.499 m)   Weight: 152 lb (68.947 kg)    Body mass index is 30.68  kg/(m^2). GENERAL: The patient is a well-nourished female, in no acute distress. The vital signs are documented above. CARDIOVASCULAR: There is a regular rate and rhythm. I do not detect any carotid bruits. PULMONARY: There is good air exchange bilaterally without wheezing or rales. ABDOMEN: Soft and non-tender with normal pitched bowel sounds.  MUSCULOSKELETAL: There are no major deformities or cyanosis. NEUROLOGIC: No focal weakness or paresthesias are detected. SKIN: There are no ulcers or rashes noted. PSYCHIATRIC: The patient has a normal affect.  DATA:  I have independently interpreted her carotid duplex scan shows a less than 40% carotid stenosis bilaterally. She is antegrade flow in both vertebral arteries.  MEDICAL ISSUES:  Carotid artery disease The patient has asymptomatic less than 40% bilateral carotid stenoses. The original study back in 2012 it suggest a 60-79% stenosis on the right, but subsequent studies have shown a less significant stenosis. Given that the stenosis and less than 40% she remains asymptomatic, I think it is safe to stretcher follow up out to 2 years. I've ordered a fall carotid duplex scan in 2 years and I'll see her back at that time. She is to call sooner she has problems. In the meantime she knows to continue taking her aspirin.     Return in about 2 years (around 08/12/2015).  Angelia Mould [facility] [place] Beeper: [phone]

## 2013-08-11 NOTE — Assessment & Plan Note (Signed)
The patient has asymptomatic less than 40% bilateral carotid stenoses. The original study back in 2012 it suggest a 60-79% stenosis on the right, but subsequent studies have shown a less significant stenosis. Given that the stenosis and less than 40% she remains asymptomatic, I think it is safe to stretcher follow up out to 2 years. I've ordered a fall carotid duplex scan in 2 years and I'll see her back at that time. She is to call sooner she has problems. In the meantime she knows to continue taking her aspirin.

## 2013-09-28 ENCOUNTER — Ambulatory Visit (INDEPENDENT_AMBULATORY_CARE_PROVIDER_SITE_OTHER): Payer: Medicare Other | Admitting: *Deleted

## 2013-09-28 ENCOUNTER — Encounter: Payer: Self-pay | Admitting: Internal Medicine

## 2013-09-28 DIAGNOSIS — I442 Atrioventricular block, complete: Secondary | ICD-10-CM

## 2013-09-28 NOTE — Progress Notes (Signed)
Remote pacemaker transmission.   

## 2013-10-08 LAB — MDC_IDC_ENUM_SESS_TYPE_REMOTE
Battery Voltage: 2.96 V
Brady Statistic AP VP Percent: 1.5 %
Brady Statistic AP VS Percent: 1 %
Brady Statistic AS VP Percent: 98 %
Brady Statistic RA Percent Paced: 1.5 %
Brady Statistic RV Percent Paced: 99 %
Date Time Interrogation Session: 20150630075218
Implantable Pulse Generator Serial Number: 7382911
Lead Channel Impedance Value: 400 Ohm
Lead Channel Pacing Threshold Amplitude: 0.75 V
Lead Channel Pacing Threshold Pulse Width: 0.5 ms
Lead Channel Sensing Intrinsic Amplitude: 1.3 mV
Lead Channel Sensing Intrinsic Amplitude: 12 mV
Lead Channel Setting Pacing Amplitude: 2 V
Lead Channel Setting Sensing Sensitivity: 2 mV
MDC IDC MSMT BATTERY REMAINING LONGEVITY: 99 mo
MDC IDC MSMT BATTERY REMAINING PERCENTAGE: 82 %
MDC IDC MSMT LEADCHNL RA IMPEDANCE VALUE: 290 Ohm
MDC IDC MSMT LEADCHNL RV PACING THRESHOLD AMPLITUDE: 0.625 V
MDC IDC MSMT LEADCHNL RV PACING THRESHOLD PULSEWIDTH: 0.5 ms
MDC IDC SET LEADCHNL RV PACING AMPLITUDE: 0.875
MDC IDC SET LEADCHNL RV PACING PULSEWIDTH: 0.5 ms
MDC IDC STAT BRADY AS VS PERCENT: 1 %

## 2013-10-18 ENCOUNTER — Encounter: Payer: Self-pay | Admitting: Cardiology

## 2013-11-25 ENCOUNTER — Encounter (HOSPITAL_COMMUNITY): Admission: EM | Disposition: E | Payer: Self-pay | Source: Home / Self Care | Attending: Pulmonary Disease

## 2013-11-25 ENCOUNTER — Telehealth (INDEPENDENT_AMBULATORY_CARE_PROVIDER_SITE_OTHER): Payer: Self-pay | Admitting: *Deleted

## 2013-11-25 ENCOUNTER — Emergency Department (HOSPITAL_COMMUNITY): Payer: Medicare Other

## 2013-11-25 ENCOUNTER — Inpatient Hospital Stay (HOSPITAL_COMMUNITY): Payer: Medicare Other

## 2013-11-25 ENCOUNTER — Encounter (HOSPITAL_COMMUNITY): Payer: Self-pay | Admitting: *Deleted

## 2013-11-25 ENCOUNTER — Inpatient Hospital Stay (HOSPITAL_COMMUNITY)
Admission: EM | Admit: 2013-11-25 | Discharge: 2013-12-30 | DRG: 377 | Disposition: E | Payer: Medicare Other | Attending: Pulmonary Disease | Admitting: Pulmonary Disease

## 2013-11-25 DIAGNOSIS — J189 Pneumonia, unspecified organism: Secondary | ICD-10-CM | POA: Diagnosis not present

## 2013-11-25 DIAGNOSIS — R7989 Other specified abnormal findings of blood chemistry: Secondary | ICD-10-CM | POA: Diagnosis present

## 2013-11-25 DIAGNOSIS — Z8674 Personal history of sudden cardiac arrest: Secondary | ICD-10-CM | POA: Diagnosis not present

## 2013-11-25 DIAGNOSIS — Z7982 Long term (current) use of aspirin: Secondary | ICD-10-CM | POA: Diagnosis not present

## 2013-11-25 DIAGNOSIS — I050 Rheumatic mitral stenosis: Secondary | ICD-10-CM | POA: Diagnosis present

## 2013-11-25 DIAGNOSIS — J4489 Other specified chronic obstructive pulmonary disease: Secondary | ICD-10-CM | POA: Diagnosis present

## 2013-11-25 DIAGNOSIS — I428 Other cardiomyopathies: Secondary | ICD-10-CM | POA: Diagnosis present

## 2013-11-25 DIAGNOSIS — K31811 Angiodysplasia of stomach and duodenum with bleeding: Secondary | ICD-10-CM | POA: Diagnosis not present

## 2013-11-25 DIAGNOSIS — I255 Ischemic cardiomyopathy: Secondary | ICD-10-CM

## 2013-11-25 DIAGNOSIS — Z87891 Personal history of nicotine dependence: Secondary | ICD-10-CM

## 2013-11-25 DIAGNOSIS — I779 Disorder of arteries and arterioles, unspecified: Secondary | ICD-10-CM | POA: Diagnosis present

## 2013-11-25 DIAGNOSIS — I214 Non-ST elevation (NSTEMI) myocardial infarction: Secondary | ICD-10-CM | POA: Diagnosis present

## 2013-11-25 DIAGNOSIS — N179 Acute kidney failure, unspecified: Secondary | ICD-10-CM | POA: Diagnosis present

## 2013-11-25 DIAGNOSIS — IMO0002 Reserved for concepts with insufficient information to code with codable children: Secondary | ICD-10-CM | POA: Diagnosis not present

## 2013-11-25 DIAGNOSIS — Z950 Presence of cardiac pacemaker: Secondary | ICD-10-CM | POA: Diagnosis not present

## 2013-11-25 DIAGNOSIS — G4733 Obstructive sleep apnea (adult) (pediatric): Secondary | ICD-10-CM | POA: Diagnosis present

## 2013-11-25 DIAGNOSIS — J9600 Acute respiratory failure, unspecified whether with hypoxia or hypercapnia: Secondary | ICD-10-CM

## 2013-11-25 DIAGNOSIS — J9620 Acute and chronic respiratory failure, unspecified whether with hypoxia or hypercapnia: Secondary | ICD-10-CM | POA: Diagnosis present

## 2013-11-25 DIAGNOSIS — Z880 Allergy status to penicillin: Secondary | ICD-10-CM | POA: Diagnosis not present

## 2013-11-25 DIAGNOSIS — I099 Rheumatic heart disease, unspecified: Secondary | ICD-10-CM | POA: Diagnosis present

## 2013-11-25 DIAGNOSIS — I2789 Other specified pulmonary heart diseases: Secondary | ICD-10-CM | POA: Diagnosis present

## 2013-11-25 DIAGNOSIS — R578 Other shock: Secondary | ICD-10-CM | POA: Diagnosis present

## 2013-11-25 DIAGNOSIS — E871 Hypo-osmolality and hyponatremia: Secondary | ICD-10-CM | POA: Diagnosis present

## 2013-11-25 DIAGNOSIS — I469 Cardiac arrest, cause unspecified: Secondary | ICD-10-CM | POA: Diagnosis not present

## 2013-11-25 DIAGNOSIS — D62 Acute posthemorrhagic anemia: Secondary | ICD-10-CM | POA: Diagnosis present

## 2013-11-25 DIAGNOSIS — D500 Iron deficiency anemia secondary to blood loss (chronic): Secondary | ICD-10-CM | POA: Diagnosis present

## 2013-11-25 DIAGNOSIS — E872 Acidosis, unspecified: Secondary | ICD-10-CM | POA: Diagnosis present

## 2013-11-25 DIAGNOSIS — I080 Rheumatic disorders of both mitral and aortic valves: Secondary | ICD-10-CM | POA: Diagnosis present

## 2013-11-25 DIAGNOSIS — Z66 Do not resuscitate: Secondary | ICD-10-CM | POA: Diagnosis not present

## 2013-11-25 DIAGNOSIS — T17508A Unspecified foreign body in bronchus causing other injury, initial encounter: Secondary | ICD-10-CM

## 2013-11-25 DIAGNOSIS — J810 Acute pulmonary edema: Secondary | ICD-10-CM

## 2013-11-25 DIAGNOSIS — Z7189 Other specified counseling: Secondary | ICD-10-CM

## 2013-11-25 DIAGNOSIS — R7309 Other abnormal glucose: Secondary | ICD-10-CM | POA: Diagnosis present

## 2013-11-25 DIAGNOSIS — I10 Essential (primary) hypertension: Secondary | ICD-10-CM | POA: Diagnosis present

## 2013-11-25 DIAGNOSIS — J948 Other specified pleural conditions: Secondary | ICD-10-CM

## 2013-11-25 DIAGNOSIS — T17808A Unspecified foreign body in other parts of respiratory tract causing other injury, initial encounter: Secondary | ICD-10-CM

## 2013-11-25 DIAGNOSIS — R0600 Dyspnea, unspecified: Secondary | ICD-10-CM

## 2013-11-25 DIAGNOSIS — R778 Other specified abnormalities of plasma proteins: Secondary | ICD-10-CM

## 2013-11-25 DIAGNOSIS — I5021 Acute systolic (congestive) heart failure: Secondary | ICD-10-CM

## 2013-11-25 DIAGNOSIS — E870 Hyperosmolality and hypernatremia: Secondary | ICD-10-CM | POA: Diagnosis not present

## 2013-11-25 DIAGNOSIS — R0789 Other chest pain: Secondary | ICD-10-CM | POA: Diagnosis present

## 2013-11-25 DIAGNOSIS — I6529 Occlusion and stenosis of unspecified carotid artery: Secondary | ICD-10-CM

## 2013-11-25 DIAGNOSIS — J9811 Atelectasis: Secondary | ICD-10-CM

## 2013-11-25 DIAGNOSIS — E876 Hypokalemia: Secondary | ICD-10-CM | POA: Diagnosis present

## 2013-11-25 DIAGNOSIS — I340 Nonrheumatic mitral (valve) insufficiency: Secondary | ICD-10-CM

## 2013-11-25 DIAGNOSIS — I252 Old myocardial infarction: Secondary | ICD-10-CM

## 2013-11-25 DIAGNOSIS — R0689 Other abnormalities of breathing: Secondary | ICD-10-CM

## 2013-11-25 DIAGNOSIS — I2510 Atherosclerotic heart disease of native coronary artery without angina pectoris: Secondary | ICD-10-CM | POA: Diagnosis present

## 2013-11-25 DIAGNOSIS — Z79899 Other long term (current) drug therapy: Secondary | ICD-10-CM

## 2013-11-25 DIAGNOSIS — K219 Gastro-esophageal reflux disease without esophagitis: Secondary | ICD-10-CM | POA: Diagnosis present

## 2013-11-25 DIAGNOSIS — K921 Melena: Secondary | ICD-10-CM | POA: Diagnosis present

## 2013-11-25 DIAGNOSIS — R4182 Altered mental status, unspecified: Secondary | ICD-10-CM | POA: Diagnosis present

## 2013-11-25 DIAGNOSIS — I272 Pulmonary hypertension, unspecified: Secondary | ICD-10-CM

## 2013-11-25 DIAGNOSIS — I739 Peripheral vascular disease, unspecified: Secondary | ICD-10-CM | POA: Diagnosis present

## 2013-11-25 DIAGNOSIS — I509 Heart failure, unspecified: Secondary | ICD-10-CM

## 2013-11-25 DIAGNOSIS — D509 Iron deficiency anemia, unspecified: Secondary | ICD-10-CM

## 2013-11-25 DIAGNOSIS — I442 Atrioventricular block, complete: Secondary | ICD-10-CM | POA: Diagnosis present

## 2013-11-25 DIAGNOSIS — T17408A Unspecified foreign body in trachea causing other injury, initial encounter: Secondary | ICD-10-CM | POA: Diagnosis present

## 2013-11-25 DIAGNOSIS — I350 Nonrheumatic aortic (valve) stenosis: Secondary | ICD-10-CM

## 2013-11-25 DIAGNOSIS — Z515 Encounter for palliative care: Secondary | ICD-10-CM

## 2013-11-25 DIAGNOSIS — I5043 Acute on chronic combined systolic (congestive) and diastolic (congestive) heart failure: Secondary | ICD-10-CM | POA: Diagnosis present

## 2013-11-25 DIAGNOSIS — J90 Pleural effusion, not elsewhere classified: Secondary | ICD-10-CM

## 2013-11-25 DIAGNOSIS — J449 Chronic obstructive pulmonary disease, unspecified: Secondary | ICD-10-CM | POA: Diagnosis present

## 2013-11-25 DIAGNOSIS — R40244 Other coma, without documented Glasgow coma scale score, or with partial score reported, unspecified time: Secondary | ICD-10-CM

## 2013-11-25 DIAGNOSIS — R4020 Unspecified coma: Secondary | ICD-10-CM

## 2013-11-25 DIAGNOSIS — I059 Rheumatic mitral valve disease, unspecified: Secondary | ICD-10-CM

## 2013-11-25 HISTORY — DX: Gastro-esophageal reflux disease without esophagitis: K21.9

## 2013-11-25 HISTORY — DX: Presence of cardiac pacemaker: Z95.0

## 2013-11-25 HISTORY — DX: Adverse effect of unspecified anesthetic, initial encounter: T41.45XA

## 2013-11-25 HISTORY — DX: Cardiac arrest, cause unspecified: I46.9

## 2013-11-25 HISTORY — DX: Other complications of anesthesia, initial encounter: T88.59XA

## 2013-11-25 HISTORY — PX: ESOPHAGOGASTRODUODENOSCOPY: SHX5428

## 2013-11-25 LAB — I-STAT ARTERIAL BLOOD GAS, ED
Acid-base deficit: 19 mmol/L — ABNORMAL HIGH (ref 0.0–2.0)
BICARBONATE: 13.3 meq/L — AB (ref 20.0–24.0)
O2 Saturation: 91 %
PH ART: 6.857 — AB (ref 7.350–7.450)
Patient temperature: 98.6
TCO2: 16 mmol/L (ref 0–100)
pCO2 arterial: 74.7 mmHg (ref 35.0–45.0)
pO2, Arterial: 109 mmHg — ABNORMAL HIGH (ref 80.0–100.0)

## 2013-11-25 LAB — URINE MICROSCOPIC-ADD ON

## 2013-11-25 LAB — URINALYSIS, ROUTINE W REFLEX MICROSCOPIC
Bilirubin Urine: NEGATIVE
GLUCOSE, UA: 250 mg/dL — AB
KETONES UR: NEGATIVE mg/dL
Leukocytes, UA: NEGATIVE
NITRITE: NEGATIVE
PH: 5 (ref 5.0–8.0)
Protein, ur: >300 mg/dL — AB
Specific Gravity, Urine: 1.015 (ref 1.005–1.030)
Urobilinogen, UA: 0.2 mg/dL (ref 0.0–1.0)

## 2013-11-25 LAB — BASIC METABOLIC PANEL
Anion gap: 20 — ABNORMAL HIGH (ref 5–15)
Anion gap: 16 — ABNORMAL HIGH (ref 5–15)
BUN: 20 mg/dL (ref 6–23)
BUN: 29 mg/dL — ABNORMAL HIGH (ref 6–23)
CALCIUM: 9.8 mg/dL (ref 8.4–10.5)
CO2: 16 meq/L — AB (ref 19–32)
CO2: 21 meq/L (ref 19–32)
CREATININE: 0.91 mg/dL (ref 0.50–1.10)
Calcium: 8.6 mg/dL (ref 8.4–10.5)
Chloride: 94 mEq/L — ABNORMAL LOW (ref 96–112)
Chloride: 101 mEq/L (ref 96–112)
Creatinine, Ser: 1.32 mg/dL — ABNORMAL HIGH (ref 0.50–1.10)
GFR calc Af Amer: 68 mL/min — ABNORMAL LOW (ref >90)
GFR calc Af Amer: 43 mL/min — ABNORMAL LOW (ref >90)
GFR calc non Af Amer: 58 mL/min — ABNORMAL LOW (ref >90)
GFR calc non Af Amer: 37 mL/min — ABNORMAL LOW (ref >90)
Glucose, Bld: 382 mg/dL — ABNORMAL HIGH (ref 70–99)
Glucose, Bld: 111 mg/dL — ABNORMAL HIGH (ref 70–99)
POTASSIUM: 4.3 meq/L (ref 3.7–5.3)
Potassium: 4.4 mEq/L (ref 3.7–5.3)
SODIUM: 138 meq/L (ref 137–147)
Sodium: 130 mEq/L — ABNORMAL LOW (ref 137–147)

## 2013-11-25 LAB — CBC WITH DIFFERENTIAL/PLATELET
BASOS ABS: 0.4 10*3/uL — AB (ref 0.0–0.1)
BASOS PCT: 2 % — AB (ref 0–1)
EOS ABS: 0.4 10*3/uL (ref 0.0–0.7)
Eosinophils Relative: 2 % (ref 0–5)
HCT: 18 % — ABNORMAL LOW (ref 36.0–46.0)
Hemoglobin: 4.8 g/dL — CL (ref 12.0–15.0)
Lymphocytes Relative: 24 % (ref 12–46)
Lymphs Abs: 4.6 10*3/uL — ABNORMAL HIGH (ref 0.7–4.0)
MCH: 18.5 pg — ABNORMAL LOW (ref 26.0–34.0)
MCHC: 26.7 g/dL — AB (ref 30.0–36.0)
MCV: 69.2 fL — ABNORMAL LOW (ref 78.0–100.0)
MONO ABS: 1.9 10*3/uL — AB (ref 0.1–1.0)
Monocytes Relative: 10 % (ref 3–12)
NEUTROS PCT: 62 % (ref 43–77)
Neutro Abs: 12 10*3/uL — ABNORMAL HIGH (ref 1.7–7.7)
PLATELETS: 262 10*3/uL (ref 150–400)
RBC: 2.6 MIL/uL — ABNORMAL LOW (ref 3.87–5.11)
RDW: 18.4 % — ABNORMAL HIGH (ref 11.5–15.5)
WBC: 19.3 10*3/uL — ABNORMAL HIGH (ref 4.0–10.5)

## 2013-11-25 LAB — PREPARE RBC (CROSSMATCH)

## 2013-11-25 LAB — I-STAT CHEM 8, ED
BUN: 25 mg/dL — ABNORMAL HIGH (ref 6–23)
CHLORIDE: 101 meq/L (ref 96–112)
Calcium, Ion: 1.46 mmol/L — ABNORMAL HIGH (ref 1.13–1.30)
Creatinine, Ser: 1 mg/dL (ref 0.50–1.10)
Glucose, Bld: 391 mg/dL — ABNORMAL HIGH (ref 70–99)
HEMATOCRIT: 18 % — AB (ref 36.0–46.0)
Hemoglobin: 6.1 g/dL — CL (ref 12.0–15.0)
POTASSIUM: 4.1 meq/L (ref 3.7–5.3)
SODIUM: 129 meq/L — AB (ref 137–147)
TCO2: 18 mmol/L (ref 0–100)

## 2013-11-25 LAB — POCT I-STAT 3, ART BLOOD GAS (G3+)
Acid-base deficit: 6 mmol/L — ABNORMAL HIGH (ref 0.0–2.0)
Bicarbonate: 20.9 mEq/L (ref 20.0–24.0)
O2 Saturation: 96 %
PH ART: 7.256 — AB (ref 7.350–7.450)
TCO2: 22 mmol/L (ref 0–100)
pCO2 arterial: 46.7 mmHg — ABNORMAL HIGH (ref 35.0–45.0)
pO2, Arterial: 96 mmHg (ref 80.0–100.0)

## 2013-11-25 LAB — LACTIC ACID, PLASMA: Lactic Acid, Venous: 8.5 mmol/L — ABNORMAL HIGH (ref 0.5–2.2)

## 2013-11-25 LAB — ABO/RH: ABO/RH(D): O NEG

## 2013-11-25 LAB — TROPONIN I
TROPONIN I: 1.16 ng/mL — AB (ref <0.30)
Troponin I: 0.37 ng/mL (ref <0.30)

## 2013-11-25 LAB — I-STAT CG4 LACTIC ACID, ED: LACTIC ACID, VENOUS: 8.13 mmol/L — AB (ref 0.5–2.2)

## 2013-11-25 LAB — TSH: TSH: 1.23 u[IU]/mL (ref 0.350–4.500)

## 2013-11-25 LAB — GLUCOSE, CAPILLARY
GLUCOSE-CAPILLARY: 245 mg/dL — AB (ref 70–99)
Glucose-Capillary: 103 mg/dL — ABNORMAL HIGH (ref 70–99)

## 2013-11-25 LAB — MRSA PCR SCREENING: MRSA BY PCR: NEGATIVE

## 2013-11-25 SURGERY — EGD (ESOPHAGOGASTRODUODENOSCOPY)
Anesthesia: Moderate Sedation

## 2013-11-25 MED ORDER — EPINEPHRINE HCL 0.1 MG/ML IJ SOSY
PREFILLED_SYRINGE | INTRAMUSCULAR | Status: AC | PRN
  Administered 2013-11-25 (×2): 1 mg via INTRAVENOUS

## 2013-11-25 MED ORDER — ALBUTEROL SULFATE (2.5 MG/3ML) 0.083% IN NEBU: 2.5000 mg | INHALATION_SOLUTION | RESPIRATORY_TRACT | Status: DC | PRN

## 2013-11-25 MED ORDER — MIDAZOLAM HCL 5 MG/ML IJ SOLN
INTRAMUSCULAR | Status: AC
  Filled 2013-11-25: qty 2

## 2013-11-25 MED ORDER — INSULIN ASPART 100 UNIT/ML ~~LOC~~ SOLN
0.0000 [IU] | SUBCUTANEOUS | Status: DC
  Administered 2013-11-25: 11 [IU] via SUBCUTANEOUS
  Administered 2013-11-26: 3 [IU] via SUBCUTANEOUS
  Administered 2013-11-26: 4 [IU] via SUBCUTANEOUS
  Administered 2013-11-26: 3 [IU] via SUBCUTANEOUS

## 2013-11-25 MED ORDER — MIDAZOLAM HCL 2 MG/2ML IJ SOLN
2.0000 mg | Freq: Once | INTRAMUSCULAR | Status: AC
  Administered 2013-11-25: 2 mg via INTRAVENOUS
  Filled 2013-11-25: qty 2

## 2013-11-25 MED ORDER — SODIUM CHLORIDE 0.9 % IV SOLN: INTRAVENOUS | Status: DC

## 2013-11-25 MED ORDER — SODIUM CHLORIDE 0.9 % IV SOLN
80.0000 mg | Freq: Once | INTRAVENOUS | Status: AC
  Administered 2013-11-25: 80 mg via INTRAVENOUS
  Filled 2013-11-25 (×2): qty 80

## 2013-11-25 MED ORDER — NOREPINEPHRINE BITARTRATE 1 MG/ML IV SOLN
2.0000 ug/min | INTRAVENOUS | Status: DC
  Administered 2013-11-25: 5 ug/min via INTRAVENOUS
  Filled 2013-11-25: qty 4

## 2013-11-25 MED ORDER — PANTOPRAZOLE SODIUM 40 MG IV SOLR: 40.0000 mg | Freq: Two times a day (BID) | INTRAVENOUS | Status: DC

## 2013-11-25 MED ORDER — SODIUM CHLORIDE 0.9 % IV SOLN
8.0000 mg/h | INTRAVENOUS | Status: DC
  Administered 2013-11-25 – 2013-11-27 (×6): 8 mg/h via INTRAVENOUS
  Filled 2013-11-25 (×12): qty 80

## 2013-11-25 MED ORDER — CETYLPYRIDINIUM CHLORIDE 0.05 % MT LIQD
7.0000 mL | Freq: Four times a day (QID) | OROMUCOSAL | Status: DC
  Administered 2013-11-26 (×4): 7 mL via OROMUCOSAL

## 2013-11-25 MED ORDER — SODIUM CHLORIDE 0.9 % IV SOLN: 2000.0000 mL | Freq: Once | INTRAVENOUS | Status: DC

## 2013-11-25 MED ORDER — SODIUM BICARBONATE 8.4 % IV SOLN
50.0000 meq | Freq: Once | INTRAVENOUS | Status: AC
  Administered 2013-11-25: 50 meq via INTRAVENOUS

## 2013-11-25 MED ORDER — SODIUM CHLORIDE 0.9 % IV SOLN
INTRAVENOUS | Status: AC | PRN
  Administered 2013-11-25: 1000 mL via INTRAVENOUS

## 2013-11-25 MED ORDER — CALCIUM CHLORIDE 10 % IV SOLN
INTRAVENOUS | Status: AC | PRN
  Administered 2013-11-25: 1 g via INTRAVENOUS

## 2013-11-25 MED ORDER — FENTANYL CITRATE 0.05 MG/ML IJ SOLN
100.0000 ug | Freq: Once | INTRAMUSCULAR | Status: AC
  Administered 2013-11-25: 100 ug via INTRAVENOUS
  Filled 2013-11-25: qty 2

## 2013-11-25 MED ORDER — NOREPINEPHRINE BITARTRATE 1 MG/ML IV SOLN
0.5000 ug/min | INTRAVENOUS | Status: DC
  Filled 2013-11-25: qty 4

## 2013-11-25 MED ORDER — FENTANYL CITRATE 0.05 MG/ML IJ SOLN
INTRAMUSCULAR | Status: DC | PRN
  Administered 2013-11-25: 25 ug via INTRAVENOUS

## 2013-11-25 MED ORDER — SODIUM CHLORIDE 0.9 % IV SOLN
Freq: Once | INTRAVENOUS | Status: AC
  Administered 2013-11-25: 1000 mL via INTRAVENOUS

## 2013-11-25 MED ORDER — MIDAZOLAM HCL 10 MG/2ML IJ SOLN
INTRAMUSCULAR | Status: DC | PRN
  Administered 2013-11-25: 2 mg via INTRAVENOUS

## 2013-11-25 MED ORDER — SODIUM CHLORIDE 0.9 % IV SOLN
250.0000 mL | INTRAVENOUS | Status: DC | PRN
  Administered 2013-11-25 – 2013-12-04 (×2): 250 mL via INTRAVENOUS

## 2013-11-25 MED ORDER — FENTANYL CITRATE 0.05 MG/ML IJ SOLN
INTRAMUSCULAR | Status: AC
  Filled 2013-11-25: qty 2

## 2013-11-25 MED ORDER — SODIUM CHLORIDE 0.9 % IV SOLN
1.0000 mg/h | INTRAVENOUS | Status: DC
Start: 2013-11-25 — End: 2013-11-25
  Filled 2013-11-25: qty 10

## 2013-11-25 MED ORDER — SODIUM CHLORIDE 0.9 % IV SOLN
25.0000 ug/h | INTRAVENOUS | Status: DC
  Filled 2013-11-25: qty 50

## 2013-11-25 MED ORDER — DIPHENHYDRAMINE HCL 50 MG/ML IJ SOLN
INTRAMUSCULAR | Status: AC
  Filled 2013-11-25: qty 1

## 2013-11-25 MED ORDER — FENTANYL CITRATE 0.05 MG/ML IJ SOLN
50.0000 ug | INTRAMUSCULAR | Status: AC | PRN
  Administered 2013-11-25 (×3): 50 ug via INTRAVENOUS
  Filled 2013-11-25 (×2): qty 2

## 2013-11-25 MED ORDER — CHLORHEXIDINE GLUCONATE 0.12 % MT SOLN
15.0000 mL | Freq: Two times a day (BID) | OROMUCOSAL | Status: DC
  Administered 2013-11-25 – 2013-11-26 (×3): 15 mL via OROMUCOSAL
  Filled 2013-11-25 (×3): qty 15

## 2013-11-25 MED ORDER — FENTANYL CITRATE 0.05 MG/ML IJ SOLN
50.0000 ug | INTRAMUSCULAR | Status: DC | PRN
  Administered 2013-11-26: 50 ug via INTRAVENOUS
  Filled 2013-11-25: qty 2

## 2013-11-25 MED ORDER — VASOPRESSIN 20 UNIT/ML IJ SOLN
INTRAMUSCULAR | Status: AC | PRN
  Administered 2013-11-25: 20 [IU]

## 2013-11-25 MED ORDER — SODIUM CHLORIDE 0.9 % IV SOLN
1.0000 ug/kg/min | INTRAVENOUS | Status: DC
  Filled 2013-11-25: qty 20

## 2013-11-25 NOTE — Progress Notes (Signed)
  Echocardiogram 2D Echocardiogram has been performed.  Felicia Diaz 11/21/2013, 5:33 PM

## 2013-11-25 NOTE — Care Management Note (Addendum)
    Page 1 of 1   12/02/2013     10:26:22 AM CARE MANAGEMENT NOTE 12/02/2013  Patient:  Felicia Diaz, Felicia Diaz   Account Number:  [id number]  Date Initiated:  11/21/2013  Documentation initiated by:  Elissa Hefty  Subjective/Objective Assessment:   adm w arrest-vent - post GIB     Action/Plan:   from home, pcp dr Shanon Brow tapper   Anticipated DC Date:  12/03/2013   Anticipated DC Plan:  LONG TERM ACUTE CARE (LTAC)      DC Planning Services  CM consult      Choice offered to / List presented to:             Status of service:  Completed, signed off Medicare Important Message given?  YES (If response is "NO", the following Medicare IM given date fields will be blank) Date Medicare IM given:  11/29/2013 Medicare IM given by:  Elissa Hefty Date Additional Medicare IM given:  12/02/2013 Additional Medicare IM given by:  [facility] Hason Ofarrell  Discharge Disposition:    Per UR Regulation:  Reviewed for med. necessity/level of care/duration of stay  If discussed at [place] of Stay Meetings, dates discussed:   11/30/2013  12/02/2013    Comments:  Contact:  Rakestraw,Charles Nephew Snover       [phone]  11-28-13 [place] - [phone] Now extubated and on bipap

## 2013-11-25 NOTE — Code Documentation (Signed)
Hold Cooling per CC MD

## 2013-11-25 NOTE — ED Notes (Signed)
Critical care notified of critical troponin.

## 2013-11-25 NOTE — ED Notes (Signed)
Radiology called for portable.

## 2013-11-25 NOTE — Plan of Care (Signed)
Problem: Phase I Progression Outcomes Goal: Voiding-avoid urinary catheter unless indicated Outcome: Not Met (add Reason) Catheter inserted post arrest

## 2013-11-25 NOTE — ED Notes (Signed)
[employer]. Call for SOB. Increasing lethargy, decreasing LOC

## 2013-11-25 NOTE — Consult Note (Signed)
Reason for Consult:   PEA arrest  Requesting Physician: CCM  HPI: This is a 80 y.o. female with a past medical history significant for CHB s/p St Jude PTVDP Sept 2013, HTN, PVD- moderate carotid disease followed by Dr Scot Dock. She apparently had black stools at home for 48 hrs. She called her friend this am SOB. They called her PCP-Dr Scotty Court and they instructed her to go to [facility] ER. When her friend arrived it was clear she would need an ambulance and EMS was contacted. They brought her directly to [facility]. In the ER she had PEA arrest Rx'd with medications and intubation. Her Hgb was 4.8. She is seen now in the MICU. She is intubated but responding to questions. She denies any angina and I could find no history of CAD or MI.   PMHx:  Past Medical History  Diagnosis Date  . CHF (congestive heart failure)     2D echo showed mild LVH and  hyperdynamic Left  ventricle with EF > 65%  . Pulmonary hypertension, mild   . Pericardial effusion     small pericardial effusion without evidence of amponade  . Hypertension   . Anemia     Admitted with severe anemia hemoglobin 5.8 colonoscopy pending  . Mitral valve regurgitation      mild mitral stenosis and mild mitral regurgitation no personal history of rheumatic fever but history of rheumatic fever and sister   . Subendocardial infarction     In the setting of severe anemia. Mild troponin leak  . Carotid artery disease     Bilateral 70% ICA stenosis  . COPD (chronic obstructive pulmonary disease)   . Sleep apnea   . CHB (complete heart block)     St Jude Pacemaker  . CAD (coronary artery disease)   . Atrioventricular block, complete     Past Surgical History  Procedure Laterality Date  . Abdominal hysterectomy    . Breast lumpectomy    . Givens capsule study  01/02/2011    Procedure: GIVENS CAPSULE STUDY;  Surgeon: Rogene Houston, MD;  Location: [facility];  Service: Endoscopy;  Laterality: N/A;  7:30 am  . Tonsillectomy and  adenoidectomy    . Pacemaker placement  12-23-2011    SOCHx:  reports that she quit smoking about 3 years ago. Her smoking use included Cigarettes. She has a 27.5 pack-year smoking history. She has never used smokeless tobacco. She reports that she does not drink alcohol or use illicit drugs.  FAMHx: Family History  Problem Relation Age of Onset  . Colon cancer Father     died age 45  . Cancer Father   . Healthy Brother   . Cancer Brother   . Heart disease Sister   . Heart disease Mother     ALLERGIES: Allergies  Allergen Reactions  . Latex Rash  . Penicillins Itching and Rash    ROS: Review of systems not obtained due to patient factors.  HOME MEDICATIONS: Prior to Admission medications   Medication Sig Start Date End Date Taking? Authorizing Provider  amLODipine (NORVASC) 2.5 MG tablet Take 2.5 mg by mouth daily.  07/16/12  Yes Historical Provider, MD  aspirin EC 81 MG tablet Take 81 mg by mouth daily.   Yes Historical Provider, MD  Calcium Carb-Cholecalciferol 600-800 MG-UNIT TABS Take 1 tablet by mouth daily.   Yes Historical Provider, MD  dexlansoprazole (DEXILANT) 60 MG capsule Take 60 mg by mouth daily.  Yes Historical Provider, MD  ferrous sulfate 325 (65 FE) MG tablet Take 325 mg by mouth daily with breakfast.   Yes Historical Provider, MD  furosemide (LASIX) 20 MG tablet Take 40 mg by mouth daily.    Yes Historical Provider, MD  Multiple Vitamin (MULTIVITAMIN) tablet Take 1 tablet by mouth daily. Women's 50+   Yes Historical Provider, MD  PROAIR HFA 108 (90 BASE) MCG/ACT inhaler Inhale 2 puffs into the lungs 4 (four) times daily as needed for wheezing or shortness of breath.  10/29/13  Yes Historical Provider, MD    HOSPITAL MEDICATIONS: I have reviewed the patient's current medications.  VITALS: Blood pressure 90/34, pulse 98, temperature 97.6 F (36.4 C), temperature source Rectal, resp. rate 30, weight 151 lb 14.4 oz (68.9 kg), SpO2 100.00%.  PHYSICAL  EXAM: General appearance: alert and intubated Neck: central line on Rt Lungs: intubated, diffuse rhonchi Heart: regular rate and rhythm Abdomen: Obese, not distended Extremities: no edema Pulses: 2+ and symmetric diminnished Skin: pallor Neurologic: Grossly normal  LABS: Results for orders placed during the hospital encounter of 11/05/2013 (from the past 24 hour(s))  CBC WITH DIFFERENTIAL     Status: Abnormal   Collection Time    11/10/2013 12:10 PM      Result Value Ref Range   WBC 19.3 (*) 4.0 - 10.5 K/uL   RBC 2.60 (*) 3.87 - 5.11 MIL/uL   Hemoglobin 4.8 (*) 12.0 - 15.0 g/dL   HCT 18.0 (*) 36.0 - 46.0 %   MCV 69.2 (*) 78.0 - 100.0 fL   MCH 18.5 (*) 26.0 - 34.0 pg   MCHC 26.7 (*) 30.0 - 36.0 g/dL   RDW 18.4 (*) 11.5 - 15.5 %   Platelets 262  150 - 400 K/uL   Neutrophils Relative % 62  43 - 77 %   Lymphocytes Relative 24  12 - 46 %   Monocytes Relative 10  3 - 12 %   Eosinophils Relative 2  0 - 5 %   Basophils Relative 2 (*) 0 - 1 %   Neutro Abs 12.0 (*) 1.7 - 7.7 K/uL   Lymphs Abs 4.6 (*) 0.7 - 4.0 K/uL   Monocytes Absolute 1.9 (*) 0.1 - 1.0 K/uL   Eosinophils Absolute 0.4  0.0 - 0.7 K/uL   Basophils Absolute 0.4 (*) 0.0 - 0.1 K/uL   RBC Morphology RARE NRBCs     WBC Morphology MILD LEFT SHIFT (1-5% METAS, OCC MYELO, OCC BANDS)    BASIC METABOLIC PANEL     Status: Abnormal   Collection Time    10/30/2013 12:10 PM      Result Value Ref Range   Sodium 130 (*) 137 - 147 mEq/L   Potassium 4.4  3.7 - 5.3 mEq/L   Chloride 94 (*) 96 - 112 mEq/L   CO2 16 (*) 19 - 32 mEq/L   Glucose, Bld 382 (*) 70 - 99 mg/dL   BUN 20  6 - 23 mg/dL   Creatinine, Ser 0.91  0.50 - 1.10 mg/dL   Calcium 9.8  8.4 - 10.5 mg/dL   GFR calc non Af Amer 58 (*) >90 mL/min   GFR calc Af Amer 68 (*) >90 mL/min   Anion gap 20 (*) 5 - 15  LACTIC ACID, PLASMA     Status: Abnormal   Collection Time    11/18/2013 12:10 PM      Result Value Ref Range   Lactic Acid, Venous 8.5 (*) 0.5 - 2.2 mmol/L  TROPONIN I      Status: Abnormal   Collection Time    11/14/2013 12:10 PM      Result Value Ref Range   Troponin I 0.37 (*) <0.30 ng/mL  I-STAT ARTERIAL BLOOD GAS, ED     Status: Abnormal   Collection Time    11/24/2013 12:24 PM      Result Value Ref Range   pH, Arterial 6.857 (*) 7.350 - 7.450   pCO2 arterial 74.7 (*) 35.0 - 45.0 mmHg   pO2, Arterial 109.0 (*) 80.0 - 100.0 mmHg   Bicarbonate 13.3 (*) 20.0 - 24.0 mEq/L   TCO2 16  0 - 100 mmol/L   O2 Saturation 91.0     Acid-base deficit 19.0 (*) 0.0 - 2.0 mmol/L   Patient temperature 98.6 F     Collection site RADIAL, ALLEN'S TEST ACCEPTABLE     Drawn by Operator     Sample type ARTERIAL     Comment NOTIFIED PHYSICIAN    I-STAT CG4 LACTIC ACID, ED     Status: Abnormal   Collection Time    11/02/2013 12:27 PM      Result Value Ref Range   Lactic Acid, Venous 8.13 (*) 0.5 - 2.2 mmol/L  I-STAT CHEM 8, ED     Status: Abnormal   Collection Time    11/21/2013 12:34 PM      Result Value Ref Range   Sodium 129 (*) 137 - 147 mEq/L   Potassium 4.1  3.7 - 5.3 mEq/L   Chloride 101  96 - 112 mEq/L   BUN 25 (*) 6 - 23 mg/dL   Creatinine, Ser 1.00  0.50 - 1.10 mg/dL   Glucose, Bld 391 (*) 70 - 99 mg/dL   Calcium, Ion 1.46 (*) 1.13 - 1.30 mmol/L   TCO2 18  0 - 100 mmol/L   Hemoglobin 6.1 (*) 12.0 - 15.0 g/dL   HCT 18.0 (*) 36.0 - 46.0 %    EKG: AV paced  IMAGING: Dg Chest Port 1 View  10/30/2013   CLINICAL DATA:  Hypoxia  EXAM: PORTABLE CHEST - 1 VIEW  COMPARISON:  November 25, 2013  FINDINGS: Endotracheal tube tip is 3.5 cm above the carina. Pacemaker leads are attached to the right atrium and right ventricle. Central catheter tip is in the superior vena cava. Nasogastric tube tip and side port are below the diaphragm. No pneumothorax. There is a moderate effusion with cardiomegaly and generalized interstitial edema. There is no appreciable alveolar consolidation. There is pulmonary venous hypertension. There is atherosclerotic change in aorta. No  adenopathy.  IMPRESSION: Tube and catheter positions as described without appreciable pneumothorax. Congestive heart failure.   Electronically Signed   By: Lowella Grip M.D.   On: 11/02/2013 13:30   Dg Chest Port 1 View  11/23/2013   CLINICAL DATA:  Status post cardiac arrest.  EXAM: PORTABLE CHEST - 1 VIEW  COMPARISON:  Two-view chest x-ray 09/24/2013  FINDINGS: Heart is enlarged. The patient is now intubated. The endotracheal tube terminates 2.7 cm of above the carina and could be pulled back 1-2 cm for more optimal positioning. An NG tube courses off the inferior border of the film. Cardiac pacing wires are stable. Diffuse interstitial edema has increased. Bibasilar airspace disease is noted. Pleural effusions are suspected.  IMPRESSION: 1. The endotracheal tube terminates 2.7 cm above the carina and could be pulled back 1-2 cm for more optimal positioning. 2. Cardiomegaly with increased interstitial edema and probable bilateral pleural effusions  compatible with congestive heart failure. 3. Bibasilar airspace disease likely reflects atelectasis. Infection is considered less likely.   Electronically Signed   By: Lawrence Santiago M.D.   On: 11/10/2013 12:44    IMPRESSION: Principal Problem:   Cardiac arrest Active Problems:   Acute respiratory failure   Acute post-hemorrhagic anemia   Metabolic acidosis   Carotid artery disease   Rheumatic heart disease-Mild MS MR   Obstructive sleep apnea   Pacemaker-St.Jude- Sept 2013- for CHB   Altered mental status   Essential hypertension   RECOMMENDATION: Dr Percival Spanish to see, echo ordered. Check TSH- (0.226 in Sept 2013 with normal free T4).   Time Spent Directly with Patient: 40 minutes  Erlene Quan [phone] beeper 11/28/2013, 1:54 PM    History and all data above reviewed.  Patient examined.  I agree with the findings as above.  The with recent GI bleed and profound anemia.  She is status post PEA arrest and is fully awake now on the vent.   Her troponin is mildly elevated after her code.  However, she says that she was not having any chest pain prior to presentation.  She has had SOB and weakness.  The patient exam reveals COR:RRR  ,  Lungs: Clear  ,  Abd: Positive bowel sounds, no rebound no guarding, Ext No edema  .  All available labs, radiology testing, previous records reviewed. Agree with documented assessment and plan. Elevated Troponin:  Not unexpected with a Hbg less than 5.  We will check a cardiac echo.  However, I doubt that any further work up will be needed.  We will follow.  Jeneen Rinks Mitul Hallowell  2:42 PM  11/04/2013

## 2013-11-25 NOTE — Telephone Encounter (Signed)
Patient needs to go to ER and she has been informed by Ms.Todd.

## 2013-11-25 NOTE — Op Note (Signed)
[facility] [place], [id number]   OPERATIVE PROCEDURE REPORT  PATIENT: Felicia Diaz, Felicia Diaz  MR#: [id number] BIRTHDATE: Feb 06, 1933  GENDER: Female ENDOSCOPIST: Carol Ada, MD ASSISTANT:   Carmie End, RN and Cristopher Estimable, technician PROCEDURE DATE: 11/16/2013 PROCEDURE:   EGD w/ transendoscopic intraluminal tube or catheter placement ASA CLASS:   Class IV INDICATIONS:Melena and severe anemia. MEDICATIONS: Versed 2 mg IV and Fentanyl 25 mcg IV TOPICAL ANESTHETIC:   none  DESCRIPTION OF PROCEDURE:   After the risks benefits and alternatives of the procedure were thoroughly explained, informed consent was obtained.  The Pentax Gastroscope [id number]  endoscope was introduced through the mouth  and advanced to the proximal jejunum Without limitations.      The instrument was slowly withdrawn as the mucosa was fully examined.   FINDINGS: The esophagus was normal.  No evidence of any bleeding in this area.  In the gastric lumen there was no evidence of any recent bleeding, however, a moderately-sized AVM was found in the proximal gastric body.  With her history of AVMs, I cauterized the AVM with a 7 Fr BICAP probe.  The APC was not available for the ICU bedside case.  Multiple applications of the BICAP were applied as the AVM continued to ooze.  Once the bleeding was arrested two hemoclips were placed across the site to secure the area.  The duodenal bulb was normal, but distally the mucosa was abnormal in appearance.  There were areas of denuded mucosa and possible pinpoint AVMs.  Again, no active bleeding this area up to the proximal jejunum.  No classic appearing AVMs identified. The scope was then withdrawn from the patient and the procedure terminated.   COMPLICATIONS: There were no complications.  IMPRESSION: 1) Gastric AVM s/p ablation. 2) Patchy denuded duodenal and proximal jejunal mucosa of  unknown signficance.  RECOMMENDATIONS: 1) Follow HGB and transfuse as necessary. 2) If anemia persists/recurs a capsule endoscopy is recommended.  _______________________________ eSignedCarol Ada, MD 10/31/2013 5:43 PM

## 2013-11-25 NOTE — Progress Notes (Signed)
Chaplain responded to post-CPR. Escorted family to consultation room B. Was present during physician update to family. Pt's HPOA, [name], is coming to hospital. Chaplain provided emotional support, pastoral presence, hospitality, and prayer. Consulted with RH: Will follow up, but please page if needed.   Ethelene Browns [phone]

## 2013-11-25 NOTE — H&P (Signed)
PULMONARY / CRITICAL CARE MEDICINE   Name: Felicia Diaz MRN: [id number] DOB: 04-03-1932    ADMISSION DATE:  11/21/2013  REFERRING MD :  EDP  CHIEF COMPLAINT:  Post arrest, GI bleed   INITIAL PRESENTATION: 80 yo female with hx CHF, COPD, HTN presented 8/27 with c/o SOB, 2 day hx black tarry stools and was directed to ER by outpt MD.  On arrival became increasingly lethargic, agonal resps, PEA arrest, intubated.  PCCM called to admit.    STUDIES:    SIGNIFICANT EVENTS: 8/27 admit, PEA arrest, GI bleed    HISTORY OF PRESENT ILLNESS:  80 yo female with hx CHF, COPD, HTN presented 8/27 with c/o SOB, 2 day hx black tarry stools, weakness and was directed to ER by outpt MD.  Per reports was awake, alert, joking with EMS.  On arrival to ER became increasingly pale, lethargic, progressed to agonal resps, PEA arrest, intubated.  PCCM called to admit.    PAST MEDICAL HISTORY :  Past Medical History  Diagnosis Date  . CHF (congestive heart failure)     2D echo showed mild LVH and  hyperdynamic Left  ventricle with EF > 65%  . Pulmonary hypertension, mild   . Pericardial effusion     small pericardial effusion without evidence of amponade  . Hypertension   . Anemia     Admitted with severe anemia hemoglobin 5.8 colonoscopy pending  . Mitral valve regurgitation      mild mitral stenosis and mild mitral regurgitation no personal history of rheumatic fever but history of rheumatic fever and sister   . Subendocardial infarction     In the setting of severe anemia. Mild troponin leak  . Carotid artery disease     Bilateral 70% ICA stenosis  . COPD (chronic obstructive pulmonary disease)   . Sleep apnea   . CHB (complete heart block)     St Jude Pacemaker  . CAD (coronary artery disease)   . Atrioventricular block, complete    Past Surgical History  Procedure Laterality Date  . Abdominal hysterectomy    . Breast lumpectomy    . Givens capsule study  01/02/2011    Procedure: GIVENS  CAPSULE STUDY;  Surgeon: Rogene Houston, MD;  Location: [facility];  Service: Endoscopy;  Laterality: N/A;  7:30 am  . Tonsillectomy and adenoidectomy    . Pacemaker placement  12-23-2011   Prior to Admission medications   Medication Sig Start Date End Date Taking? Authorizing Provider  amLODipine (NORVASC) 2.5 MG tablet Take 1 tablet by mouth daily. 07/16/12   Historical Provider, MD  aspirin 81 MG tablet Take 81 mg by mouth daily.    Historical Provider, MD  dexlansoprazole (DEXILANT) 60 MG capsule Take 60 mg by mouth daily.    Historical Provider, MD  furosemide (LASIX) 20 MG tablet Take 20 mg by mouth daily.      Historical Provider, MD  PROAIR HFA 108 (90 BASE) MCG/ACT inhaler  10/29/13   Historical Provider, MD   Allergies  Allergen Reactions  . Latex   . Penicillins Itching and Rash    FAMILY HISTORY:  Family History  Problem Relation Age of Onset  . Colon cancer Father     died age 78  . Cancer Father   . Healthy Brother   . Cancer Brother   . Heart disease Sister   . Heart disease Mother    SOCIAL HISTORY:  reports that she quit smoking about 3 years ago.  Her smoking use included Cigarettes. She has a 27.5 pack-year smoking history. She has never used smokeless tobacco. She reports that she does not drink alcohol or use illicit drugs.  REVIEW OF SYSTEMS:  As per HPI, otherwise unable.  Sedated on vent.   SUBJECTIVE:   VITAL SIGNS: Temp:  [97.6 F (36.4 C)] 97.6 F (36.4 C) (08/27 1230) Pulse Rate:  [58-70] 58 (08/27 1215) Resp:  [20-22] 21 (08/27 1215) BP: (140-151)/(49-50) 151/49 mmHg (08/27 1215) SpO2:  [98 %] 98 % (08/27 1215) FiO2 (%):  [100 %] 100 % (08/27 1210) Weight:  [151 lb 14.4 oz (68.9 kg)] 151 lb 14.4 oz (68.9 kg) (08/27 1200) HEMODYNAMICS:   VENTILATOR SETTINGS: Vent Mode:  [-] PRVC FiO2 (%):  [100 %] 100 % Set Rate:  [20 bmp] 20 bmp Vt Set:  [450 mL] 450 mL PEEP:  [5 cmH20] 5 cmH20 Plateau Pressure:  [23 cmH20] 23 cmH20 INTAKE /  OUTPUT: No intake or output data in the 24 hours ending 11/12/2013 1236  PHYSICAL EXAMINATION: General:  Chronically ill appearing female, NAD on vent  Neuro:  Sedated, RASS 0, intermittently agitated  HEENT:  Mm dry, pale, no JVD, ETT Cardiovascular:  s1s2 brady Lungs:  resps even non labored on vent, diminished bases, few exp wheezes  Abdomen:  abd round, mildly distended, +bs  Musculoskeletal:  Pale, cool, dry, no sig edema   LABS:  CBC No results found for this basename: WBC, HGB, HCT, PLT,  in the last 168 hours Coag's No results found for this basename: APTT, INR,  in the last 168 hours BMET No results found for this basename: NA, K, CL, CO2, BUN, CREATININE, GLUCOSE,  in the last 168 hours Electrolytes No results found for this basename: CALCIUM, MG, PHOS,  in the last 168 hours Sepsis Markers  Recent Labs Lab 11/29/2013 1227  LATICACIDVEN 8.13*   ABG  Recent Labs Lab 11/20/2013 1224  PHART 6.857*  PCO2ART 74.7*  PO2ART 109.0*   Liver Enzymes No results found for this basename: AST, ALT, ALKPHOS, BILITOT, ALBUMIN,  in the last 168 hours Cardiac Enzymes No results found for this basename: TROPONINI, PROBNP,  in the last 168 hours Glucose No results found for this basename: GLUCAP,  in the last 168 hours  Imaging No results found.   ASSESSMENT / PLAN:  PULMONARY OETT 8/27>>> Acute respiratory failure - post arrest in setting GI bleed and severe anemia.  Hx COPD P:   Vent support - rr 30  F/u ABG  F/u CXR  PRN BD   CARDIOVASCULAR CVL L IJ CVL 8/27>>> PEA arrest  Hx complete heart block - s/p pacemaker Hypotension/ hypovolemic shock - in setting GI bleed  P:  No cooling - very brief arrest, active bleed, +neurologic function post arrest  Trend troponin  Will ask cards to see  Hold home anti-htn  Volume resuscitation/ blood products 12 lead EKG   RENAL Hyponatremia  Acute renal insufficiency  P:   Volume as above  F/u chem    GASTROINTESTINAL GI bleed  P:   GI to see  protonix gtt  PRBC as below   HEMATOLOGIC Acute blood loss anemia - in setting GI bleed  P:  PRBC x 4 units now  Stay 2 units ahead  q6 hour CBC  SCD's   INFECTIOUS No active issue  P:   Monitor wbc, fever curve off abx   ENDOCRINE DM P:   SSI HgbA1c  NEUROLOGIC Sedation  P:  RASS goal: -1 Awake, agitated, purposeful post arrest - no cooling  Daily WUA  fent  Nickolas Madrid, NP 11/18/2013  12:36 PM Pager: [phone] or [phone]  TODAY'S SUMMARY: 80 yo female with GI bleed, PEA arrest.  No cooling.  Continue vent support, aggressive volume/PRBC resuscitation.  Close f/u CBC, trend troponin.  GI and Cards to see.    I have personally obtained a history, examined the patient, evaluated laboratory and imaging results, formulated the assessment and plan and placed orders.  CRITICAL CARE: The patient is critically ill with multiple organ systems failure and requires high complexity decision making for assessment and support, frequent evaluation and titration of therapies, application of advanced monitoring technologies and extensive interpretation of multiple databases. Critical Care Time devoted to patient care services described in this note is 90 minutes.   *Care during the described time interval was provided by me and/or other providers on the critical care team. I have reviewed this patient's available data, including medical history, events of note, physical examination and test results as part of my evaluation.  Rush Farmer, M.D. [facility] Pulmonary/Critical Care Medicine. Pager: [phone]. After hours pager: [phone].

## 2013-11-25 NOTE — ED Notes (Signed)
NOTIFIED DR. Doy Mince IN PERSON FOR PATIENTS PANIC LAB RESULTS OF CG4+ LACTIC ACID = 8.13 MMOl/L @12 :35 PM ,11/16/2013.

## 2013-11-25 NOTE — Procedures (Signed)
Arterial Catheter Insertion Procedure Note CHRYL HOLTEN [id number] [birth year]  Procedure: Insertion of Arterial Catheter  Indications: Blood pressure monitoring and Frequent blood sampling  Procedure Details Consent: Unable to obtain consent because of altered level of consciousness. Time Out: Verified patient identification, verified procedure, site/side was marked, verified correct patient position, special equipment/implants available, medications/allergies/relevent history reviewed, required imaging and test results available.  Performed  Maximum sterile technique was used including antiseptics, cap, gloves, gown, hand hygiene, mask and sheet. Skin prep: Chlorhexidine; local anesthetic administered 24 gauge catheter was inserted into right femoral artery using the Seldinger technique.  Evaluation Blood flow good; BP tracing good. Complications: No apparent complications.  Procedure performed under direct ultrasound guidance for real time vessel cannulation.      Montey Hora, [facility] Pgr: [phone] - [phone]  or [phone]  I was present and supervised the entire procedure.  Rush Farmer, M.D. [facility] Pulmonary/Critical Care Medicine. Pager: [phone]. After hours pager: [phone].

## 2013-11-25 NOTE — Procedures (Addendum)
Central Venous Catheter Insertion Procedure Note Felicia Diaz [id number] June 27, 1932  Procedure: Insertion of Central Venous Catheter Indications: Assessment of intravascular volume, Drug and/or fluid administration and Frequent blood sampling  Procedure Details Consent: Unable to obtain consent because of emergent medical necessity. Time Out: Verified patient identification, verified procedure, site/side was marked, verified correct patient position, special equipment/implants available, medications/allergies/relevent history reviewed, required imaging and test results available.  Performed  Maximum sterile technique was used including antiseptics, cap, gloves, gown, hand hygiene, mask and sheet. Skin prep: Chlorhexidine; local anesthetic administered A antimicrobial bonded/coated triple lumen catheter was placed in the right internal jugular vein using the Seldinger technique. Ultrasound guidance used.Yes.   Catheter placed to 17 cm. Blood aspirated via all 3 ports and then flushed x 3. Line sutured x 2 and dressing applied.  Evaluation Blood flow good Complications: No apparent complications Patient did tolerate procedure well. Chest X-ray ordered to verify placement.  CXR: pending.  U/S used in placement.  Richardson Landry Minor ACNP Maryanna Shape PCCM Pager [phone] till 3 pm If no answer page [phone] 11/04/2013, 1:07 PM  I was present and supervised the entire procedure.  Rush Farmer, M.D. [facility] Pulmonary/Critical Care Medicine. Pager: [phone]. After hours pager: [phone].

## 2013-11-25 NOTE — Progress Notes (Signed)
eLink Physician-Brief Progress Note Patient Name: [name] DOB: 08-Mar-1933 MRN: [id number]   Date of Service  11/27/2013  HPI/Events of Note   80 yo female with hx CHF, COPD, HTN presented 8/27 with c/o SOB, 2 day hx black tarry stools and was directed to ER by outpt MD. On arrival became increasingly lethargic, agonal resps, PEA arrest (short), intubated. Transferred to the ICU for further management   eICU Interventions  80 yo with PEA (brief) and possible GIB 1. PEA - short, no hypothermic protocol at this time - check lytes - possible secondary to blood loss  2. Shock - hypovolumic and hemorrhagic - low bp on admission and anemia - continue with resuscitation of fluids, prbc, pressors  3. GIB - upper vs lower - GI consult - EGD pending  Continue with admitting MD orders     Intervention Category Evaluation Type: New Patient Evaluation  Locklan Canoy 11/19/2013, 3:53 PM

## 2013-11-25 NOTE — Telephone Encounter (Signed)
Patient called office and states that 2 days ago she started having dark stools. Today ,patient is very SOB, states that her stools are pure black , like licorice. She states that she is also very weak. She called her PCP , who cannot see her until Monday. With the patient 's symptoms and history , I advised her to go to the ED for evaluation. Dr.Rehman to be made aware.

## 2013-11-25 NOTE — Consult Note (Addendum)
Unassigned Consult  Reason for Consult: Severe GI bleed Referring Physician: CCM  Felicia Diaz HPI: This is a 80 year old female with a PMH of erosive esophagitis (08/2010), small bowel AVMs, right colonic AVMs, HTN, MVR and CHF who was admitted for melena.  The patient called her primary GI, Dr. Laural Golden, to let him know about her melena for the past 2 days.  She was told to go to [facility] for further evaluation.  On the way down to [facility] she suffered from a brief PEA, which resolved with CPR.  Her HGB upon admission was noted to be in the 4 range.  She is currently intubated.  The history was obtained from the chart.  Past Medical History  Diagnosis Date  . CHF (congestive heart failure)     2D echo showed mild LVH and  hyperdynamic Left  ventricle with EF > 65%  . Pulmonary hypertension, mild   . Pericardial effusion     small pericardial effusion without evidence of amponade  . Hypertension   . Anemia     Admitted with severe anemia hemoglobin 5.8 colonoscopy pending  . Mitral valve regurgitation      mild mitral stenosis and mild mitral regurgitation no personal history of rheumatic fever but history of rheumatic fever and sister   . Subendocardial infarction     In the setting of severe anemia. Mild troponin leak  . Carotid artery disease     Bilateral 70% ICA stenosis  . COPD (chronic obstructive pulmonary disease)   . Sleep apnea   . CHB (complete heart block)     St Jude Pacemaker  . CAD (coronary artery disease)   . Atrioventricular block, complete     Past Surgical History  Procedure Laterality Date  . Abdominal hysterectomy    . Breast lumpectomy    . Givens capsule study  01/02/2011    Procedure: GIVENS CAPSULE STUDY;  Surgeon: Rogene Houston, MD;  Location: [facility];  Service: Endoscopy;  Laterality: N/A;  7:30 am  . Tonsillectomy and adenoidectomy    . Pacemaker placement  12-23-2011    Family History  Problem Relation Age of Onset  .  Colon cancer Father     died age 89  . Cancer Father   . Healthy Brother   . Cancer Brother   . Heart disease Sister   . Heart disease Mother     Social History:  reports that she quit smoking about 3 years ago. Her smoking use included Cigarettes. She has a 27.5 pack-year smoking history. She has never used smokeless tobacco. She reports that she does not drink alcohol or use illicit drugs.  Allergies:  Allergies  Allergen Reactions  . Latex Rash  . Penicillins Itching and Rash    Medications:  Scheduled: . sodium chloride   Intravenous Once  . insulin aspart  0-20 Units Subcutaneous 6 times per day  . pantoprazole (PROTONIX) IV  80 mg Intravenous Once  . [START ON 11/29/2013] pantoprazole (PROTONIX) IV  40 mg Intravenous Q12H   Continuous: . pantoprozole (PROTONIX) infusion      Results for orders placed during the hospital encounter of 11/19/2013 (from the past 24 hour(s))  CBC WITH DIFFERENTIAL     Status: Abnormal   Collection Time    11/16/2013 12:10 PM      Result Value Ref Range   WBC 19.3 (*) 4.0 - 10.5 K/uL   RBC 2.60 (*) 3.87 - 5.11 MIL/uL  Hemoglobin 4.8 (*) 12.0 - 15.0 g/dL   HCT 18.0 (*) 36.0 - 46.0 %   MCV 69.2 (*) 78.0 - 100.0 fL   MCH 18.5 (*) 26.0 - 34.0 pg   MCHC 26.7 (*) 30.0 - 36.0 g/dL   RDW 18.4 (*) 11.5 - 15.5 %   Platelets 262  150 - 400 K/uL   Neutrophils Relative % 62  43 - 77 %   Lymphocytes Relative 24  12 - 46 %   Monocytes Relative 10  3 - 12 %   Eosinophils Relative 2  0 - 5 %   Basophils Relative 2 (*) 0 - 1 %   Neutro Abs 12.0 (*) 1.7 - 7.7 K/uL   Lymphs Abs 4.6 (*) 0.7 - 4.0 K/uL   Monocytes Absolute 1.9 (*) 0.1 - 1.0 K/uL   Eosinophils Absolute 0.4  0.0 - 0.7 K/uL   Basophils Absolute 0.4 (*) 0.0 - 0.1 K/uL   RBC Morphology RARE NRBCs     WBC Morphology MILD LEFT SHIFT (1-5% METAS, OCC MYELO, OCC BANDS)    BASIC METABOLIC PANEL     Status: Abnormal   Collection Time    11/13/2013 12:10 PM      Result Value Ref Range   Sodium 130  (*) 137 - 147 mEq/L   Potassium 4.4  3.7 - 5.3 mEq/L   Chloride 94 (*) 96 - 112 mEq/L   CO2 16 (*) 19 - 32 mEq/L   Glucose, Bld 382 (*) 70 - 99 mg/dL   BUN 20  6 - 23 mg/dL   Creatinine, Ser 0.91  0.50 - 1.10 mg/dL   Calcium 9.8  8.4 - 10.5 mg/dL   GFR calc non Af Amer 58 (*) >90 mL/min   GFR calc Af Amer 68 (*) >90 mL/min   Anion gap 20 (*) 5 - 15  LACTIC ACID, PLASMA     Status: Abnormal   Collection Time    11/06/2013 12:10 PM      Result Value Ref Range   Lactic Acid, Venous 8.5 (*) 0.5 - 2.2 mmol/L  TROPONIN I     Status: Abnormal   Collection Time    11/21/2013 12:10 PM      Result Value Ref Range   Troponin I 0.37 (*) <0.30 ng/mL  I-STAT ARTERIAL BLOOD GAS, ED     Status: Abnormal   Collection Time    11/04/2013 12:24 PM      Result Value Ref Range   pH, Arterial 6.857 (*) 7.350 - 7.450   pCO2 arterial 74.7 (*) 35.0 - 45.0 mmHg   pO2, Arterial 109.0 (*) 80.0 - 100.0 mmHg   Bicarbonate 13.3 (*) 20.0 - 24.0 mEq/L   TCO2 16  0 - 100 mmol/L   O2 Saturation 91.0     Acid-base deficit 19.0 (*) 0.0 - 2.0 mmol/L   Patient temperature 98.6 F     Collection site RADIAL, ALLEN'S TEST ACCEPTABLE     Drawn by Operator     Sample type ARTERIAL     Comment NOTIFIED PHYSICIAN    I-STAT CG4 LACTIC ACID, ED     Status: Abnormal   Collection Time    11/24/2013 12:27 PM      Result Value Ref Range   Lactic Acid, Venous 8.13 (*) 0.5 - 2.2 mmol/L  I-STAT CHEM 8, ED     Status: Abnormal   Collection Time    11/05/2013 12:34 PM      Result Value Ref  Range   Sodium 129 (*) 137 - 147 mEq/L   Potassium 4.1  3.7 - 5.3 mEq/L   Chloride 101  96 - 112 mEq/L   BUN 25 (*) 6 - 23 mg/dL   Creatinine, Ser 1.00  0.50 - 1.10 mg/dL   Glucose, Bld 391 (*) 70 - 99 mg/dL   Calcium, Ion 1.46 (*) 1.13 - 1.30 mmol/L   TCO2 18  0 - 100 mmol/L   Hemoglobin 6.1 (*) 12.0 - 15.0 g/dL   HCT 18.0 (*) 36.0 - 46.0 %     Dg Chest Port 1 View  11/01/2013   CLINICAL DATA:  Hypoxia  EXAM: PORTABLE CHEST - 1 VIEW   COMPARISON:  November 25, 2013  FINDINGS: Endotracheal tube tip is 3.5 cm above the carina. Pacemaker leads are attached to the right atrium and right ventricle. Central catheter tip is in the superior vena cava. Nasogastric tube tip and side port are below the diaphragm. No pneumothorax. There is a moderate effusion with cardiomegaly and generalized interstitial edema. There is no appreciable alveolar consolidation. There is pulmonary venous hypertension. There is atherosclerotic change in aorta. No adenopathy.  IMPRESSION: Tube and catheter positions as described without appreciable pneumothorax. Congestive heart failure.   Electronically Signed   By: Lowella Grip M.D.   On: 11/14/2013 13:30   Dg Chest Port 1 View  11/08/2013   CLINICAL DATA:  Status post cardiac arrest.  EXAM: PORTABLE CHEST - 1 VIEW  COMPARISON:  Two-view chest x-ray 09/24/2013  FINDINGS: Heart is enlarged. The patient is now intubated. The endotracheal tube terminates 2.7 cm of above the carina and could be pulled back 1-2 cm for more optimal positioning. An NG tube courses off the inferior border of the film. Cardiac pacing wires are stable. Diffuse interstitial edema has increased. Bibasilar airspace disease is noted. Pleural effusions are suspected.  IMPRESSION: 1. The endotracheal tube terminates 2.7 cm above the carina and could be pulled back 1-2 cm for more optimal positioning. 2. Cardiomegaly with increased interstitial edema and probable bilateral pleural effusions compatible with congestive heart failure. 3. Bibasilar airspace disease likely reflects atelectasis. Infection is considered less likely.   Electronically Signed   By: Lawrence Santiago M.D.   On: 11/10/2013 12:44    ROS:  As stated above in the HPI otherwise negative.  Blood pressure 90/34, pulse 78, temperature 97.6 F (36.4 C), temperature source Rectal, resp. rate 30, weight 151 lb 14.4 oz (68.9 kg), SpO2 100.00%.    PE: Gen: Intubated, but alert. Lungs: CTA  Bilaterally CV: RRR without M/G/R ABM: Soft, NTND, +BS Ext: No C/C/E  Assessment/Plan: 1) Severe GI bleed. 2) History of erosive esohpagitis and AVMs.   She is hemodynamically stable and given the severity of her bleed an emergent EGD is warranted.  I do not know how fast she dropped down with her HGB as there were no values in EPIC from 2013 to 2015.    Plan: 1) Transfuse. 2) EGD today. 3) PPI.  ADDENDUM:    I was able to review her work up and treatment at [facility] with Dr. Arsenio Loader.  A double balloon enteroscopy was performed in 2012 with findings of 6-7 AVMs in the duodenum and proximal jejunum.  Her prior capsule endoscopy by Dr. Laural Golden revealed a clot in the mid small bowel.  The AVMs were ablated with APC.  Giulia Hickey D 11/22/2013, 1:46 PM

## 2013-11-26 ENCOUNTER — Inpatient Hospital Stay (HOSPITAL_COMMUNITY): Payer: Medicare Other

## 2013-11-26 ENCOUNTER — Encounter (HOSPITAL_COMMUNITY): Payer: Self-pay | Admitting: Gastroenterology

## 2013-11-26 LAB — GLUCOSE, CAPILLARY
GLUCOSE-CAPILLARY: 175 mg/dL — AB (ref 70–99)
GLUCOSE-CAPILLARY: 115 mg/dL — AB (ref 70–99)
Glucose-Capillary: 112 mg/dL — ABNORMAL HIGH (ref 70–99)
Glucose-Capillary: 140 mg/dL — ABNORMAL HIGH (ref 70–99)
Glucose-Capillary: 150 mg/dL — ABNORMAL HIGH (ref 70–99)

## 2013-11-26 LAB — CBC
HCT: 32.2 % — ABNORMAL LOW (ref 36.0–46.0)
HCT: 31.7 % — ABNORMAL LOW (ref 36.0–46.0)
HEMATOCRIT: 33.5 % — AB (ref 36.0–46.0)
Hemoglobin: 11.2 g/dL — ABNORMAL LOW (ref 12.0–15.0)
Hemoglobin: 10.7 g/dL — ABNORMAL LOW (ref 12.0–15.0)
Hemoglobin: 10.5 g/dL — ABNORMAL LOW (ref 12.0–15.0)
MCH: 25.2 pg — ABNORMAL LOW (ref 26.0–34.0)
MCH: 25.2 pg — ABNORMAL LOW (ref 26.0–34.0)
MCH: 25.1 pg — ABNORMAL LOW (ref 26.0–34.0)
MCHC: 33.4 g/dL (ref 30.0–36.0)
MCHC: 33.2 g/dL (ref 30.0–36.0)
MCHC: 33.1 g/dL (ref 30.0–36.0)
MCV: 75.3 fL — ABNORMAL LOW (ref 78.0–100.0)
MCV: 75.9 fL — ABNORMAL LOW (ref 78.0–100.0)
MCV: 75.8 fL — ABNORMAL LOW (ref 78.0–100.0)
PLATELETS: 169 10*3/uL (ref 150–400)
Platelets: 194 10*3/uL (ref 150–400)
Platelets: 188 10*3/uL (ref 150–400)
RBC: 4.24 MIL/uL (ref 3.87–5.11)
RBC: 4.18 MIL/uL (ref 3.87–5.11)
RBC: 4.45 MIL/uL (ref 3.87–5.11)
RDW: 22.1 % — ABNORMAL HIGH (ref 11.5–15.5)
RDW: 21.8 % — ABNORMAL HIGH (ref 11.5–15.5)
RDW: 21.9 % — AB (ref 11.5–15.5)
WBC: 32.4 10*3/uL — ABNORMAL HIGH (ref 4.0–10.5)
WBC: 27.8 10*3/uL — ABNORMAL HIGH (ref 4.0–10.5)
WBC: 26.4 10*3/uL — ABNORMAL HIGH (ref 4.0–10.5)

## 2013-11-26 LAB — BASIC METABOLIC PANEL
Anion gap: 13 (ref 5–15)
BUN: 36 mg/dL — ABNORMAL HIGH (ref 6–23)
CALCIUM: 8.4 mg/dL (ref 8.4–10.5)
CO2: 23 meq/L (ref 19–32)
CREATININE: 1.63 mg/dL — AB (ref 0.50–1.10)
Chloride: 103 mEq/L (ref 96–112)
GFR calc Af Amer: 33 mL/min — ABNORMAL LOW (ref >90)
GFR calc non Af Amer: 29 mL/min — ABNORMAL LOW (ref >90)
Glucose, Bld: 146 mg/dL — ABNORMAL HIGH (ref 70–99)
Potassium: 4.6 mEq/L (ref 3.7–5.3)
Sodium: 139 mEq/L (ref 137–147)

## 2013-11-26 LAB — TYPE AND SCREEN
ABO/RH(D): O NEG
Antibody Screen: NEGATIVE
UNIT DIVISION: 0
UNIT DIVISION: 0
Unit division: 0
Unit division: 0

## 2013-11-26 LAB — BLOOD GAS, ARTERIAL
ACID-BASE DEFICIT: 13.8 mmol/L — AB (ref 0.0–2.0)
ACID-BASE EXCESS: 0.3 mmol/L (ref 0.0–2.0)
Acid-Base Excess: 3.4 mmol/L — ABNORMAL HIGH (ref 0.0–2.0)
Bicarbonate: 24.1 mEq/L — ABNORMAL HIGH (ref 20.0–24.0)
Bicarbonate: 29.2 meq/L — ABNORMAL HIGH (ref 20.0–24.0)
Bicarbonate: 9.9 mEq/L — ABNORMAL LOW (ref 20.0–24.0)
DRAWN BY: 369891
Delivery systems: POSITIVE
Drawn by: 23604
Drawn by: 369891
Expiratory PAP: 6
FIO2: 0.4 %
FIO2: 0.5 %
FIO2: 1 %
Inspiratory PAP: 14
LHR: 26 {breaths}/min
MECHVT: 450 mL
MECHVT: 450 mL
O2 SAT: 96.1 %
O2 Saturation: 94.4 %
O2 Saturation: 99 %
PEEP: 5 cmH2O
PEEP: 5 cmH2O
PO2 ART: 79 mmHg — AB (ref 80.0–100.0)
Patient temperature: 98.6
Patient temperature: 98.6
Patient temperature: 98.6
RATE: 30 resp/min
TCO2: 10.4 mmol/L (ref 0–100)
TCO2: 25.2 mmol/L (ref 0–100)
TCO2: 31 mmol/L (ref 0–100)
pCO2 arterial: 14.8 mmHg — CL (ref 35.0–45.0)
pCO2 arterial: 36.3 mmHg (ref 35.0–45.0)
pCO2 arterial: 59.6 mmHg (ref 35.0–45.0)
pH, Arterial: 7.311 — ABNORMAL LOW (ref 7.350–7.450)
pH, Arterial: 7.437 (ref 7.350–7.450)
pH, Arterial: 7.439 (ref 7.350–7.450)
pO2, Arterial: 120 mmHg — ABNORMAL HIGH (ref 80.0–100.0)
pO2, Arterial: 78.2 mmHg — ABNORMAL LOW (ref 80.0–100.0)

## 2013-11-26 LAB — LACTIC ACID, PLASMA
LACTIC ACID, VENOUS: 0.9 mmol/L (ref 0.5–2.2)
Lactic Acid, Venous: 1.6 mmol/L (ref 0.5–2.2)

## 2013-11-26 LAB — TROPONIN I

## 2013-11-26 MED ORDER — DEXTROSE 5 % IV SOLN
INTRAVENOUS | Status: DC
  Administered 2013-11-26: 03:00:00 via INTRAVENOUS
  Filled 2013-11-26 (×4): qty 150

## 2013-11-26 MED ORDER — FENTANYL CITRATE 0.05 MG/ML IJ SOLN
12.5000 ug | INTRAMUSCULAR | Status: DC | PRN
  Administered 2013-11-26: 12.5 ug via INTRAVENOUS
  Administered 2013-11-26 – 2013-11-27 (×2): 25 ug via INTRAVENOUS
  Filled 2013-11-26 (×3): qty 2

## 2013-11-26 MED ORDER — CETYLPYRIDINIUM CHLORIDE 0.05 % MT LIQD
7.0000 mL | Freq: Two times a day (BID) | OROMUCOSAL | Status: DC
  Administered 2013-11-26 – 2013-11-28 (×4): 7 mL via OROMUCOSAL

## 2013-11-26 MED FILL — Medication: Qty: 1 | Status: AC

## 2013-11-26 NOTE — Procedures (Signed)
Extubation Procedure Note  Patient Details:   Name: Felicia Diaz DOB: [birth year] MRN: [id number]   Airway Documentation:  Airway 7.5 mm (Active)  Secured at (cm) 23 cm 11/26/2013  7:28 AM  Measured From Lips 11/26/2013  7:28 AM  [place] 11/26/2013  7:28 AM  Secured By [employer] 11/26/2013  7:28 AM  Tube Holder Repositioned Yes 11/26/2013  7:28 AM  Cuff Pressure (cm H2O) 26 cm H2O 11/17/2013 11:23 PM  Site Condition Dry 11/26/2013  7:28 AM    Evaluation  O2 sats: stable throughout Complications: No apparent complications Patient did tolerate procedure well. Bilateral Breath Sounds: Clear;Diminished Suctioning: Airway Yes  Pt extubated to nasal cannula. Pt had good strong productive cough. Pt able to vocalize name. No stridor noted. BBS clear.  Vitals stable pt 91% on 5 LPM.  Takeya Marquis M 11/26/2013, 10:15 AM

## 2013-11-26 NOTE — Progress Notes (Signed)
ABG    Component Value Date/Time   PHART 7.439 11/26/2013 0129   PCO2ART 14.8* 11/26/2013 0129   PO2ART 120.0* 11/26/2013 0129   HCO3 9.9* 11/26/2013 0129   TCO2 10.4 11/26/2013 0129   ACIDBASEDEF 13.8* 11/26/2013 0129   O2SAT 99.0 11/26/2013 0129

## 2013-11-26 NOTE — Plan of Care (Signed)
Problem: Phase I Progression Outcomes Goal: VTE prophylaxis Outcome: Completed/Met Date Met:  11/26/13 SCD's

## 2013-11-26 NOTE — Progress Notes (Signed)
CRITICAL VALUE ALERT  Critical value received:  CO2 59.6  Date of notification:  11/26/2013  Time of notification:  2355  Critical value read back:Yes.    Nurse who received alert:  Fayrene Helper  MD notified (1st page):   Dr. Ancil Linsey  Time of first page:  2355  MD notified (2nd page):  Time of second page:  Responding MD:  Dr. Ancil Linsey  Time MD responded:  [phone]

## 2013-11-26 NOTE — Plan of Care (Signed)
Problem: Phase I Progression Outcomes Goal: GIProphysixis Outcome: Completed/Met Date Met:  11/26/13 Protonix drip

## 2013-11-26 NOTE — Progress Notes (Signed)
Subjective: No acute events.  Weaned off of pressors.  Objective: Vital signs in last 24 hours: Temp:  [95.4 F (35.2 C)-98.7 F (37.1 C)] 98.7 F (37.1 C) (08/28 0351) Pulse Rate:  [58-98] 87 (08/28 0700) Resp:  [17-31] 26 (08/28 0700) BP: (74-151)/(32-64) 112/44 mmHg (08/28 0700) SpO2:  [94 %-100 %] 97 % (08/28 0700) Arterial Line BP: (87-149)/(33-114) 123/50 mmHg (08/28 0700) FiO2 (%):  [40 %-100 %] 40 % (08/28 0400) Weight:  [151 lb 14.4 oz (68.9 kg)-157 lb 6.5 oz (71.4 kg)] 157 lb 6.5 oz (71.4 kg) (08/28 0330) Last BM Date: 10/30/2013  Intake/Output from previous day: 08/27 0701 - 08/28 0700 In: 3008.1 [I.V.:1643.1; Blood:1365] Out: 240 [Urine:240] Intake/Output this shift:    General appearance: alert, intubated, animated with hand gestures GI: soft, non-tender; bowel sounds normal; no masses,  no organomegaly  Lab Results:  Recent Labs  11/29/2013 1210 11/21/2013 1234 11/26/13 0015 11/26/13 0339  WBC 19.3*  --  32.4* 27.8*  HGB 4.8* 6.1* 11.2* 10.7*  HCT 18.0* 18.0* 33.5* 32.2*  PLT 262  --  194 188   BMET  Recent Labs  11/02/2013 1210 11/18/2013 1234 11/19/2013 1800 11/26/13 0339  NA 130* 129* 138 139  K 4.4 4.1 4.3 4.6  CL 94* 101 101 103  CO2 16*  --  21 23  GLUCOSE 382* 391* 111* 146*  BUN 20 25* 29* 36*  CREATININE 0.91 1.00 1.32* 1.63*  CALCIUM 9.8  --  8.6 8.4   LFT No results found for this basename: PROT, ALBUMIN, AST, ALT, ALKPHOS, BILITOT, BILIDIR, IBILI,  in the last 72 hours PT/INR No results found for this basename: LABPROT, INR,  in the last 72 hours Hepatitis Panel No results found for this basename: HEPBSAG, HCVAB, HEPAIGM, HEPBIGM,  in the last 72 hours C-Diff No results found for this basename: CDIFFTOX,  in the last 72 hours Fecal Lactopherrin No results found for this basename: FECLLACTOFRN,  in the last 72 hours  Studies/Results: Dg Chest Port 1 View  11/23/2013   CLINICAL DATA:  Hypoxia  EXAM: PORTABLE CHEST - 1 VIEW   COMPARISON:  November 25, 2013  FINDINGS: Endotracheal tube tip is 3.5 cm above the carina. Pacemaker leads are attached to the right atrium and right ventricle. Central catheter tip is in the superior vena cava. Nasogastric tube tip and side port are below the diaphragm. No pneumothorax. There is a moderate effusion with cardiomegaly and generalized interstitial edema. There is no appreciable alveolar consolidation. There is pulmonary venous hypertension. There is atherosclerotic change in aorta. No adenopathy.  IMPRESSION: Tube and catheter positions as described without appreciable pneumothorax. Congestive heart failure.   Electronically Signed   By: Lowella Grip M.D.   On: 11/14/2013 13:30   Dg Chest Port 1 View  10/30/2013   CLINICAL DATA:  Status post cardiac arrest.  EXAM: PORTABLE CHEST - 1 VIEW  COMPARISON:  Two-view chest x-ray 09/24/2013  FINDINGS: Heart is enlarged. The patient is now intubated. The endotracheal tube terminates 2.7 cm of above the carina and could be pulled back 1-2 cm for more optimal positioning. An NG tube courses off the inferior border of the film. Cardiac pacing wires are stable. Diffuse interstitial edema has increased. Bibasilar airspace disease is noted. Pleural effusions are suspected.  IMPRESSION: 1. The endotracheal tube terminates 2.7 cm above the carina and could be pulled back 1-2 cm for more optimal positioning. 2. Cardiomegaly with increased interstitial edema and probable bilateral pleural effusions compatible  with congestive heart failure. 3. Bibasilar airspace disease likely reflects atelectasis. Infection is considered less likely.   Electronically Signed   By: Lawrence Santiago M.D.   On: 11/17/2013 12:44    Medications:  Scheduled: . antiseptic oral rinse  7 mL Mouth Rinse QID  . chlorhexidine  15 mL Mouth Rinse BID  . insulin aspart  0-20 Units Subcutaneous 6 times per day  . [START ON 11/29/2013] pantoprazole (PROTONIX) IV  40 mg Intravenous Q12H    Continuous: . norepinephrine (LEVOPHED) Adult infusion Stopped (11/26/13 0507)  . pantoprozole (PROTONIX) infusion 8 mg/hr (11/26/13 0040)  .  sodium bicarbonate  infusion 1000 mL 150 mL/hr at 11/26/13 0230    Assessment/Plan: 1) GI bleed.  Presumably from AVMs in the small bowel.  See my addendum to the consult note. 2) Nonbleeding gastric AVM.   HGB has markedly improved with blood transfusions.  Clinically she appears stable.  It is difficult to discern the time course for her anemia, i.e., how fast she developed the anemia.  Her family reports that she is secretive about her health.    Plan: 1) Monitor HGB and transfuse as necessary. 2) If anemia and melena persist a repeat capsule endoscopy is warranted.   LOS: 1 day   Janaye Corp D 11/26/2013, 7:15 AM

## 2013-11-26 NOTE — Progress Notes (Signed)
eLink Physician-Brief Progress Note Patient Name: Felicia Diaz DOB: October 21, 1932 MRN: [id number]   Date of Service  11/26/2013  HPI/Events of Note  Call from bedside nurse that patient extubated today is now in resp distress with RR in the high 20 to 30s with drop in sats.  Placed on NRB with current sats of 89%.  Nurse reports little air movement on lung exam.  BP and HR are stable.  Patient is alert, following commands.  eICU Interventions  Plan: Check ABG, PCXR BiPAP  Fellow to evaluate at bedside     Intervention Category Intermediate Interventions: Respiratory distress - evaluation and management  DETERDING,ELIZABETH 11/26/2013, 11:14 PM

## 2013-11-26 NOTE — Progress Notes (Signed)
CRITICAL VALUE ALERT  Critical value received:  PCO2 14.8  Date of notification:  11/26/2013  Time of notification:  01:50  Critical value read back:Yes.    Nurse who received alert:  BShepherd, RN  MD notified (1st page):  [email or web address]  Time of first page:  01:55  MD notified (2nd page):  Time of second page:  Responding MD:  Zubelevitskiy  Time MD responded:  01:56

## 2013-11-26 NOTE — Progress Notes (Signed)
Pt using excessory muscles to breathe, increased to NRB mask, lung sounds with little air movement more on left than right, HR elevated. Dr. Jimmy Footman notified and new orders carried out.

## 2013-11-26 NOTE — Plan of Care (Signed)
Problem: Phase II Progression Outcomes Goal: Time pt extubated/weaned off vent Outcome: Completed/Met Date Met:  11/26/13 Extubated on 11/26/13 at 1015.

## 2013-11-26 NOTE — Progress Notes (Addendum)
PULMONARY / CRITICAL CARE MEDICINE   Name: Felicia Diaz MRN: [id number] DOB: [birth year]    ADMISSION DATE:  11/12/2013  REFERRING MD :  EDP  CHIEF COMPLAINT:  Post arrest, GI bleed   INITIAL PRESENTATION:  80 yo female with hx CHF, COPD, HTN presented 8/27 with c/o SOB, 2 day hx black tarry stools and was directed to ER by outpt MD.  On arrival became increasingly lethargic, agonal resps, PEA arrest, intubated.  PCCM called to admit.    STUDIES/SIGNIFICANT EVENTS: 8/27 admit hemorrhagic shock, UGIB, PEA arrest, intubated in ED. GI consult. Cardiology consult 8/27 EGD: Gastric AVM s/p ablation. Patchy denuded duodenal and proximal jejunal mucosa of unknown signficance 8/27 Echocardiogram: LVEF 40-45%, mod diffuse HK, grade 1 diastolic dysfunction   SUBJECTIVE:  Passed SBT, + F/C. Extubated. No distress post extubation   VITAL SIGNS: Temp:  [95.4 F (35.2 C)-98.7 F (37.1 C)] 98.3 F (36.8 C) (08/28 1223) Pulse Rate:  [72-105] 98 (08/28 1200) Resp:  [17-31] 27 (08/28 1200) BP: (74-144)/(32-64) 104/51 mmHg (08/28 1200) SpO2:  [91 %-100 %] 93 % (08/28 1200) Arterial Line BP: (87-149)/(33-114) 137/54 mmHg (08/28 1100) FiO2 (%):  [40 %-100 %] 40 % (08/28 1000) Weight:  [71.4 kg (157 lb 6.5 oz)] 71.4 kg (157 lb 6.5 oz) (08/28 0330) HEMODYNAMICS:   VENTILATOR SETTINGS: Vent Mode:  [-] PSV;CPAP FiO2 (%):  [40 %-100 %] 40 % Set Rate:  [26 bmp-30 bmp] 26 bmp Vt Set:  [450 mL] 450 mL PEEP:  [5 cmH20] 5 cmH20 Pressure Support:  [5 cmH20-8 cmH20] 5 cmH20 Plateau Pressure:  [19 cmH20-25 cmH20] 19 cmH20 INTAKE / OUTPUT:  Intake/Output Summary (Last 24 hours) at 11/26/13 1323 Last data filed at 11/26/13 1200  Gross per 24 hour  Intake 3333.1 ml  Output    580 ml  Net 2753.1 ml    PHYSICAL EXAMINATION: General: NAD Neuro: MAEs, cognition intact HEENT: WNL. No oral or labial telangiectasias  Cardiovascular:  Reg, no M Lungs: no adventitous sounds Abdomen: soft, +BS Ext:  warm, no edema   LABS: I have reviewed all of today's lab results. Relevant abnormalities are discussed in the A/P section   CXR: mild edema pattern with prob R effusion  ASSESSMENT / PLAN:  PULMONARY ETT 8/27>> 8/28 Acute respiratory failure post arrest Pulm edema pattern on CXR (vs TRALI) Hx COPD P:   Monitor in ICU post extubation Supp O2 to maintain SpO2 > 92% Cont PRN albuterol Minimize IVFs and let auto-correct Recheck CXR AM 8/29  CARDIOVASCULAR CVL L IJ CVL 8/27>>> A: Hemorrhagic shock PEA arrest due to hemorrhagic shock Elevated trop I (> 20 on 8/28)  Moderately impaired LV function - likely mostly stunned myocardium post arrest Hx complete heart block - s/p pacemaker P:  Cards following Monitor BP and rhythm Recheck EKG now Recheck trop I in AM 8/29 Cannot receive anticoagulants presently   RENAL Hyponatremia, resolved AKI, nonoliguric  P:   Monitor BMET intermittently Monitor I/Os Correct electrolytes as indicated   GASTROINTESTINAL UGIB, resolved Gastric AVM - s/p ablation 8/27 H/O colon AVM per daughter P:   Cont PPI Advance diet as per GI service   HEMATOLOGIC Acute blood loss anemia, no evidence of ongoing bleeding P:  DVT px: SQ heparin Monitor CBC intermittently Transfuse per usual ICU guidelines  INFECTIOUS No active issue  P:   Monitor wbc, fever curve off abx   ENDOCRINE Stress induced hyperglycemia without prior hs of DM P:   Monitor glu off  SSI - resume for glu > 180 F/U HgbA1c sent on admission  NEUROLOGIC ICU associated discomfort P:   RASS goal: 0 Low dose PRN fentanyl    TODAY'S SUMMARY:     CRITICAL CARE: The patient is critically ill with multiple organ systems failure and requires high complexity decision making for assessment and support, frequent evaluation and titration of therapies, application of advanced monitoring technologies and extensive interpretation of multiple databases. Critical Care Time  devoted to patient care services described in this note is 35 minutes.   Merton Border, MD ; [facility] [phone].  After 5:30 PM or weekends, call [phone]

## 2013-11-26 NOTE — ED Provider Notes (Signed)
CSN: [id number]     Arrival date & time 11/06/2013  1139 History   First MD Initiated Contact with Patient 11/13/2013 1148     Chief Complaint  Patient presents with  . Shortness of Breath     (Consider location/radiation/quality/duration/timing/severity/associated sxs/prior Treatment) HPI Comments: 80 yo female who presented with the chief complaint of shortness of breath.  EMS reported that she was having difficulty breathing, but was alert and talking during transport.  They reported that as they got close to the ED, she became more lethargic.  Her initial nurse in the ED stated that she was lethargic but would respond by regarding him when he spoke to her.  Very quickly, she deteriorated further and I was called to her bedside.    On my arrival, pt was agonally breathing and was unresponsive.  She was unable to provide any history.  Level V Caveat.  I subsequently spoke with her close friend and her niece who added some history, stating that she had complained of blood in her stool for a few days and was short of breath today.  They called the ambulance for her because they knew they would be unable to transport her.    Past Medical History  Diagnosis Date  . CHF (congestive heart failure)     2D echo showed mild LVH and  hyperdynamic Left  ventricle with EF > 65%  . Pulmonary hypertension, mild   . Pericardial effusion     small pericardial effusion without evidence of amponade  . Hypertension   . Anemia     Admitted with severe anemia hemoglobin 5.8 colonoscopy pending  . Mitral valve regurgitation      mild mitral stenosis and mild mitral regurgitation no personal history of rheumatic fever but history of rheumatic fever and sister   . Subendocardial infarction     In the setting of severe anemia. Mild troponin leak  . Carotid artery disease     Bilateral 70% ICA stenosis  . COPD (chronic obstructive pulmonary disease)   . Sleep apnea   . CHB (complete heart block)     St Jude  Pacemaker  . CAD (coronary artery disease)   . Atrioventricular block, complete   . Complication of anesthesia     cardiac arrest during colonoscopy per family  . GERD (gastroesophageal reflux disease)   . Pacemaker   . Cardiac arrest 11/11/2013   Past Surgical History  Procedure Laterality Date  . Abdominal hysterectomy    . Breast lumpectomy    . Givens capsule study  01/02/2011    Procedure: GIVENS CAPSULE STUDY;  Surgeon: Rogene Houston, MD;  Location: [facility];  Service: Endoscopy;  Laterality: N/A;  7:30 am  . Tonsillectomy and adenoidectomy    . Pacemaker placement  12-23-2011  . Insert / replace / remove pacemaker    . Bladder tumor excision  05/2013    [facility]   [facility]   Family History  Problem Relation Age of Onset  . Colon cancer Father     died age 68  . Cancer Father   . Healthy Brother   . Cancer Brother   . Heart disease Sister   . Heart disease Mother    History  Substance Use Topics  . Smoking status: Former Smoker -- 0.50 packs/day for 55 years    Types: Cigarettes    Quit date: 07/17/2010  . Smokeless tobacco: Never Used  . Alcohol Use: No   OB History  Grav Para Term Preterm Abortions TAB SAB Ect Mult Living                 Review of Systems  Unable to perform ROS Respiratory: Positive for shortness of breath.       Allergies  Latex and Penicillins  Home Medications   Prior to Admission medications   Medication Sig Start Date End Date Taking? Authorizing Provider  amLODipine (NORVASC) 2.5 MG tablet Take 2.5 mg by mouth daily.  07/16/12  Yes Historical Provider, MD  aspirin EC 81 MG tablet Take 81 mg by mouth daily.   Yes Historical Provider, MD  Calcium Carb-Cholecalciferol 600-800 MG-UNIT TABS Take 1 tablet by mouth daily.   Yes Historical Provider, MD  dexlansoprazole (DEXILANT) 60 MG capsule Take 60 mg by mouth daily.   Yes Historical Provider, MD  ferrous sulfate 325 (65 FE) MG tablet Take 325 mg by mouth daily with  breakfast.   Yes Historical Provider, MD  furosemide (LASIX) 20 MG tablet Take 40 mg by mouth daily.    Yes Historical Provider, MD  Multiple Vitamin (MULTIVITAMIN) tablet Take 1 tablet by mouth daily. Women's 50+   Yes Historical Provider, MD  PROAIR HFA 108 (90 BASE) MCG/ACT inhaler Inhale 2 puffs into the lungs 4 (four) times daily as needed for wheezing or shortness of breath.  10/29/13  Yes Historical Provider, MD   BP 112/44  Pulse 87  Temp(Src) 98.7 F (37.1 C) (Oral)  Resp 26  Ht 4\' 11"  (1.499 m)  Wt 157 lb 6.5 oz (71.4 kg)  BMI 31.78 kg/m2  SpO2 97% Physical Exam  Nursing note and vitals reviewed. Constitutional: She is oriented to person, place, and time. She appears well-developed and well-nourished. She appears distressed.  Pale  HENT:  Head: Normocephalic and atraumatic.  Mouth/Throat: Oropharynx is clear and moist.  Vomit and secretions in posterior oropharynx  Eyes: Pupils are equal, round, and reactive to light. No scleral icterus.  Neck: Neck supple.  Cardiovascular: Normal heart sounds.   Unable to appreciate heart sounds. No peripheral pulses.  Pulmonary/Chest: Breath sounds normal. She is in respiratory distress.  Agonal respirations  Abdominal: Soft. Bowel sounds are normal. She exhibits no distension. There is no tenderness.  Musculoskeletal: Normal range of motion.  Neurological: She is alert and oriented to person, place, and time.  Skin: Skin is warm. No rash noted. She is diaphoretic.  Psychiatric: She has a normal mood and affect. Her behavior is normal.    ED Course  CRITICAL CARE Performed by: Serita Grit DAVID III Authorized by: Serita Grit DAVID III Total critical care time: 40 minutes Critical care time was exclusive of separately billable procedures and treating other patients. Critical care was necessary to treat or prevent imminent or life-threatening deterioration of the following conditions: cardiac failure and respiratory  failure. Critical care was time spent personally by me on the following activities: development of treatment plan with patient or surrogate, discussions with consultants, evaluation of patient's response to treatment, examination of patient, obtaining history from patient or surrogate, ordering and performing treatments and interventions, ordering and review of laboratory studies, ordering and review of radiographic studies, pulse oximetry, re-evaluation of patient's condition and review of old charts.  INTUBATION Date/Time: 11/26/2013 7:35 AM Performed by: Serita Grit DAVID III Authorized by: Serita Grit DAVID III Consent: The procedure was performed in an emergent situation. Indications: respiratory failure and  airway protection Intubation method: video-assisted Patient status: unconscious Preoxygenation: BVM Laryngoscope size: glidescope 3.  Tube size: 7.5 mm Tube type: cuffed Number of attempts: 3 Ventilation between attempts: BVM Cricoid pressure: yes Cords visualized: yes Post-procedure assessment: CO2 detector Breath sounds: equal Cuff inflated: yes Tube secured with: ETT holder Chest x-ray interpreted by me and radiologist. Chest x-ray findings: endotracheal tube in appropriate position Patient tolerance: Patient tolerated the procedure well with no immediate complications.   Cardiopulmonary Resuscitation (CPR) Procedure Note Directed/Performed by: Serita Grit DAVID III I personally directed ancillary staff and/or performed CPR in an effort to regain return of spontaneous circulation and to maintain cardiac, neuro and systemic perfusion.     EMERGENCY DEPARTMENT [place] CARDIAC EXAM "Study: Limited Ultrasound of the heart and pericardium"  INDICATIONS:Cardiac arrest Multiple views of the heart and pericardium were obtained in real-time with a multi-frequency probe.  PERFORMED VZ:SMOLMB  IMAGES ARCHIVED?: No (Emergent procedure during code)  FINDINGS: No pericardial  effusion, Decreased contractility and Tamponade physiology absent  LIMITATIONS:  Body habitus and Emergent procedure  VIEWS USED: Subcostal 4 chamber and Parasternal long axis  INTERPRETATION: Cardiac activity present, Pericardial effusioin absent and Cardiac tamponade absent     (including critical care time) Labs Review Labs Reviewed  CBC WITH DIFFERENTIAL - Abnormal; Notable for the following:    WBC 19.3 (*)    RBC 2.60 (*)    Hemoglobin 4.8 (*)    HCT 18.0 (*)    MCV 69.2 (*)    MCH 18.5 (*)    MCHC 26.7 (*)    RDW 18.4 (*)    Basophils Relative 2 (*)    Neutro Abs 12.0 (*)    Lymphs Abs 4.6 (*)    Monocytes Absolute 1.9 (*)    Basophils Absolute 0.4 (*)    All other components within normal limits  BASIC METABOLIC PANEL - Abnormal; Notable for the following:    Sodium 130 (*)    Chloride 94 (*)    CO2 16 (*)    Glucose, Bld 382 (*)    GFR calc non Af Amer 58 (*)    GFR calc Af Amer 68 (*)    Anion gap 20 (*)    All other components within normal limits  URINALYSIS, ROUTINE W REFLEX MICROSCOPIC - Abnormal; Notable for the following:    APPearance TURBID (*)    Glucose, UA 250 (*)    Hgb urine dipstick SMALL (*)    Protein, ur >300 (*)    All other components within normal limits  LACTIC ACID, PLASMA - Abnormal; Notable for the following:    Lactic Acid, Venous 8.5 (*)    All other components within normal limits  TROPONIN I - Abnormal; Notable for the following:    Troponin I 0.37 (*)    All other components within normal limits  BASIC METABOLIC PANEL - Abnormal; Notable for the following:    Glucose, Bld 111 (*)    BUN 29 (*)    Creatinine, Ser 1.32 (*)    GFR calc non Af Amer 37 (*)    GFR calc Af Amer 43 (*)    Anion gap 16 (*)    All other components within normal limits  TROPONIN I - Abnormal; Notable for the following:    Troponin I 1.16 (*)    All other components within normal limits  TROPONIN I - Abnormal; Notable for the following:     Troponin I >20.00 (*)    All other components within normal limits  CBC - Abnormal; Notable for the following:  WBC 32.4 (*)    Hemoglobin 11.2 (*)    HCT 33.5 (*)    MCV 75.3 (*)    MCH 25.2 (*)    RDW 22.1 (*)    All other components within normal limits  GLUCOSE, CAPILLARY - Abnormal; Notable for the following:    Glucose-Capillary 245 (*)    All other components within normal limits  CBC - Abnormal; Notable for the following:    WBC 27.8 (*)    Hemoglobin 10.7 (*)    HCT 32.2 (*)    MCV 75.9 (*)    MCH 25.2 (*)    RDW 21.9 (*)    All other components within normal limits  URINE MICROSCOPIC-ADD ON - Abnormal; Notable for the following:    Squamous Epithelial / LPF FEW (*)    All other components within normal limits  BASIC METABOLIC PANEL - Abnormal; Notable for the following:    Glucose, Bld 146 (*)    BUN 36 (*)    Creatinine, Ser 1.63 (*)    GFR calc non Af Amer 29 (*)    GFR calc Af Amer 33 (*)    All other components within normal limits  GLUCOSE, CAPILLARY - Abnormal; Notable for the following:    Glucose-Capillary 103 (*)    All other components within normal limits  GLUCOSE, CAPILLARY - Abnormal; Notable for the following:    Glucose-Capillary 140 (*)    All other components within normal limits  BLOOD GAS, ARTERIAL - Abnormal; Notable for the following:    pCO2 arterial 14.8 (*)    pO2, Arterial 120.0 (*)    Bicarbonate 9.9 (*)    Acid-base deficit 13.8 (*)    All other components within normal limits  BLOOD GAS, ARTERIAL - Abnormal; Notable for the following:    pO2, Arterial 79.0 (*)    Bicarbonate 24.1 (*)    All other components within normal limits  GLUCOSE, CAPILLARY - Abnormal; Notable for the following:    Glucose-Capillary 150 (*)    All other components within normal limits  I-STAT CG4 LACTIC ACID, ED - Abnormal; Notable for the following:    Lactic Acid, Venous 8.13 (*)    All other components within normal limits  I-STAT ARTERIAL BLOOD  GAS, ED - Abnormal; Notable for the following:    pH, Arterial 6.857 (*)    pCO2 arterial 74.7 (*)    pO2, Arterial 109.0 (*)    Bicarbonate 13.3 (*)    Acid-base deficit 19.0 (*)    All other components within normal limits  I-STAT CHEM 8, ED - Abnormal; Notable for the following:    Sodium 129 (*)    BUN 25 (*)    Glucose, Bld 391 (*)    Calcium, Ion 1.46 (*)    Hemoglobin 6.1 (*)    HCT 18.0 (*)    All other components within normal limits  POCT I-STAT 3, ART BLOOD GAS (G3+) - Abnormal; Notable for the following:    pH, Arterial 7.256 (*)    pCO2 arterial 46.7 (*)    Acid-base deficit 6.0 (*)    All other components within normal limits  MRSA PCR SCREENING  TSH  LACTIC ACID, PLASMA  HEMOGLOBIN A1C  CBC  LACTIC ACID, PLASMA  LACTIC ACID, PLASMA  TYPE AND SCREEN  PREPARE RBC (CROSSMATCH)  ABO/RH    Imaging Review Dg Chest Port 1 View  10/31/2013   CLINICAL DATA:  Status post cardiac arrest.  EXAM: PORTABLE CHEST -  1 VIEW  COMPARISON:  Two-view chest x-ray 09/24/2013  FINDINGS: Heart is enlarged. The patient is now intubated. The endotracheal tube terminates 2.7 cm of above the carina and could be pulled back 1-2 cm for more optimal positioning. An NG tube courses off the inferior border of the film. Cardiac pacing wires are stable. Diffuse interstitial edema has increased. Bibasilar airspace disease is noted. Pleural effusions are suspected.  IMPRESSION: 1. The endotracheal tube terminates 2.7 cm above the carina and could be pulled back 1-2 cm for more optimal positioning. 2. Cardiomegaly with increased interstitial edema and probable bilateral pleural effusions compatible with congestive heart failure. 3. Bibasilar airspace disease likely reflects atelectasis. Infection is considered less likely.   Electronically Signed   By: Lawrence Santiago M.D.   On: 11/22/2013 12:44   All radiology studies independently viewed by me.      EKG Interpretation   Date/Time:  Thursday November 25 2013 11:47:04 EDT Ventricular Rate:  60 PR Interval:  64 QRS Duration: 144 QT Interval:  487 QTC Calculation: 487 R Axis:   -77 Text Interpretation:  Age not entered, assumed to be  80 years old for  purpose of ECG interpretation Ventricular-paced rhythm No further analysis  attempted due to paced rhythm No significant change was found Confirmed by  [facility]  MD, TREY ([id number]) on 11/26/2013 7:40:02 AM      MDM   Final diagnoses:  Cardiac arrest  Acute post-hemorrhagic anemia  Acute respiratory failure, unspecified whether with hypoxia or hypercapnia    80 yo female who was transported to the ED secondary to shortness of breath who suffered cardiac arrest shortly after arrival.  CPR was initiated immediately.  She was difficult to intubate due to body habitus and large amount of vomit in her oropharynx and airway.  She was ultimately successfully intubated with the glidescope.  CPR was performed for about 15 minutes.  She was in PEA (pt has PPM) throughout this code until ROSC.    Critical care was cooled.  Cooling was not initiated secondary to in hospital arrest and immediate CPR.  Additional history obtained from family reported possible GI bleed.  (Vomit during code was not coffee ground).  Hg resulted at critically low.  I spoke with Critical Care both over the phone and in person.  I consulted cardiology by phone.  Critical Care consulted GI.  Pt admitted to the ICU.      Houston Siren III, MD 11/26/13 [phone]

## 2013-11-26 NOTE — Progress Notes (Signed)
ABG    Component Value Date/Time   PHART 7.437 11/26/2013 0524   PCO2ART 36.3 11/26/2013 0524   PO2ART 79.0* 11/26/2013 0524   HCO3 24.1* 11/26/2013 0524   TCO2 25.2 11/26/2013 0524   ACIDBASEDEF 13.8* 11/26/2013 0129   O2SAT 96.1 11/26/2013 0524

## 2013-11-27 ENCOUNTER — Inpatient Hospital Stay (HOSPITAL_COMMUNITY): Payer: Medicare Other

## 2013-11-27 DIAGNOSIS — I509 Heart failure, unspecified: Secondary | ICD-10-CM

## 2013-11-27 DIAGNOSIS — I10 Essential (primary) hypertension: Secondary | ICD-10-CM

## 2013-11-27 DIAGNOSIS — I359 Nonrheumatic aortic valve disorder, unspecified: Secondary | ICD-10-CM

## 2013-11-27 DIAGNOSIS — I059 Rheumatic mitral valve disease, unspecified: Secondary | ICD-10-CM

## 2013-11-27 DIAGNOSIS — I2789 Other specified pulmonary heart diseases: Secondary | ICD-10-CM

## 2013-11-27 DIAGNOSIS — G4733 Obstructive sleep apnea (adult) (pediatric): Secondary | ICD-10-CM

## 2013-11-27 DIAGNOSIS — D62 Acute posthemorrhagic anemia: Secondary | ICD-10-CM

## 2013-11-27 DIAGNOSIS — I099 Rheumatic heart disease, unspecified: Secondary | ICD-10-CM

## 2013-11-27 DIAGNOSIS — I779 Disorder of arteries and arterioles, unspecified: Secondary | ICD-10-CM

## 2013-11-27 DIAGNOSIS — I442 Atrioventricular block, complete: Secondary | ICD-10-CM

## 2013-11-27 DIAGNOSIS — I5021 Acute systolic (congestive) heart failure: Secondary | ICD-10-CM

## 2013-11-27 DIAGNOSIS — I2589 Other forms of chronic ischemic heart disease: Secondary | ICD-10-CM

## 2013-11-27 DIAGNOSIS — Z950 Presence of cardiac pacemaker: Secondary | ICD-10-CM

## 2013-11-27 LAB — HEMOGLOBIN A1C
Hgb A1c MFr Bld: 6 % — ABNORMAL HIGH (ref <5.7)
Mean Plasma Glucose: 126 mg/dL — ABNORMAL HIGH (ref <117)

## 2013-11-27 LAB — BASIC METABOLIC PANEL
Anion gap: 11 (ref 5–15)
BUN: 35 mg/dL — ABNORMAL HIGH (ref 6–23)
CO2: 29 mEq/L (ref 19–32)
Calcium: 8.2 mg/dL — ABNORMAL LOW (ref 8.4–10.5)
Chloride: 104 mEq/L (ref 96–112)
Creatinine, Ser: 1.61 mg/dL — ABNORMAL HIGH (ref 0.50–1.10)
GFR calc Af Amer: 34 mL/min — ABNORMAL LOW (ref >90)
GFR calc non Af Amer: 29 mL/min — ABNORMAL LOW (ref >90)
Glucose, Bld: 121 mg/dL — ABNORMAL HIGH (ref 70–99)
Potassium: 4.2 mEq/L (ref 3.7–5.3)
Sodium: 144 mEq/L (ref 137–147)

## 2013-11-27 LAB — BLOOD GAS, ARTERIAL
Acid-Base Excess: 2.9 mmol/L — ABNORMAL HIGH (ref 0.0–2.0)
Bicarbonate: 27.8 mEq/L — ABNORMAL HIGH (ref 20.0–24.0)
DRAWN BY: 23604
Delivery systems: POSITIVE
Expiratory PAP: 5
FIO2: 1 %
INSPIRATORY PAP: 16
O2 SAT: 96.1 %
PATIENT TEMPERATURE: 98.6
PH ART: 7.36 (ref 7.350–7.450)
TCO2: 29.4 mmol/L (ref 0–100)
pCO2 arterial: 50.6 mmHg — ABNORMAL HIGH (ref 35.0–45.0)
pO2, Arterial: 80.7 mmHg (ref 80.0–100.0)

## 2013-11-27 LAB — CBC
HEMATOCRIT: 31.6 % — AB (ref 36.0–46.0)
Hemoglobin: 10 g/dL — ABNORMAL LOW (ref 12.0–15.0)
MCH: 24.6 pg — ABNORMAL LOW (ref 26.0–34.0)
MCHC: 31.6 g/dL (ref 30.0–36.0)
MCV: 77.8 fL — ABNORMAL LOW (ref 78.0–100.0)
PLATELETS: 187 10*3/uL (ref 150–400)
RBC: 4.06 MIL/uL (ref 3.87–5.11)
RDW: 22.5 % — ABNORMAL HIGH (ref 11.5–15.5)
WBC: 24.2 10*3/uL — AB (ref 4.0–10.5)

## 2013-11-27 LAB — TROPONIN I: Troponin I: 5.21 ng/mL (ref <0.30)

## 2013-11-27 MED ORDER — PANTOPRAZOLE SODIUM 40 MG IV SOLR
40.0000 mg | Freq: Two times a day (BID) | INTRAVENOUS | Status: DC
  Administered 2013-11-27 – 2013-12-01 (×8): 40 mg via INTRAVENOUS
  Filled 2013-11-27 (×10): qty 40

## 2013-11-27 MED ORDER — FUROSEMIDE 10 MG/ML IJ SOLN
40.0000 mg | Freq: Once | INTRAMUSCULAR | Status: AC
  Administered 2013-11-27: 40 mg via INTRAVENOUS
  Filled 2013-11-27: qty 4

## 2013-11-27 MED ORDER — FUROSEMIDE 10 MG/ML IJ SOLN
20.0000 mg | Freq: Once | INTRAMUSCULAR | Status: AC
  Administered 2013-11-27: 20 mg via INTRAVENOUS
  Filled 2013-11-27: qty 2

## 2013-11-27 NOTE — Progress Notes (Signed)
SUBJECTIVE: Pt denies chest pain. Alert and responds to questions appropriately, smiling. On BiPAP.     Intake/Output Summary (Last 24 hours) at 11/27/13 1401 Last data filed at 11/27/13 1200  Gross per 24 hour  Intake    855 ml  Output    675 ml  Net    180 ml    Current Facility-Administered Medications  Medication Dose Route Frequency Provider Last Rate Last Dose  . 0.9 %  sodium chloride infusion  250 mL Intravenous PRN Marijean Heath, NP 10 mL/hr at 11/18/2013 2000 250 mL at 11/12/2013 2000  . albuterol (PROVENTIL) (2.5 MG/3ML) 0.083% nebulizer solution 2.5 mg  2.5 mg Nebulization Q3H PRN Marijean Heath, NP      . antiseptic oral rinse (CPC / CETYLPYRIDINIUM CHLORIDE 0.05%) solution 7 mL  7 mL Mouth Rinse BID Rush Farmer, MD   7 mL at 11/26/13 2300  . fentaNYL (SUBLIMAZE) injection 12.5-25 mcg  12.5-25 mcg Intravenous Q2H PRN Wilhelmina Mcardle, MD   25 mcg at 11/26/13 1430  . pantoprazole (PROTONIX) 80 mg in sodium chloride 0.9 % 250 mL (0.32 mg/mL) infusion  8 mg/hr Intravenous Continuous Marijean Heath, NP 25 mL/hr at 11/27/13 0525 8 mg/hr at 11/27/13 0525  . [START ON 11/29/2013] pantoprazole (PROTONIX) injection 40 mg  40 mg Intravenous Q12H Marijean Heath, NP        Filed Vitals:   11/27/13 1000 11/27/13 1100 11/27/13 1157 11/27/13 1200  BP: 101/73 105/57 105/57 124/55  Pulse: 87 92 94 89  Temp:      TempSrc:      Resp: 25 25 20 17   Height:      Weight:      SpO2: 92% 92% 93% 92%    PHYSICAL EXAM General: NAD, on Bipap. HEENT: Normal. Neck: No JVD, no thyromegaly.  Lungs: Coarse and diminished sounds b/l CV: Nondisplaced PMI.  Regular rate and rhythm, normal S1/S2, no S3/S4, no murmur.  No pretibial edema.  Abdomen: Soft, nontender, no distention.  Neurologic: Alert.  Psych: Normal affect. Musculoskeletal: No gross deformities. Extremities: No clubbing or cyanosis.   TELEMETRY: Reviewed telemetry pt in sinus rhythm and  intermittently paced ventricular rhythm  LABS: Basic Metabolic Panel:  Recent Labs  11/26/13 0339 11/27/13 0445  NA 139 144  K 4.6 4.2  CL 103 104  CO2 23 29  GLUCOSE 146* 121*  BUN 36* 35*  CREATININE 1.63* 1.61*  CALCIUM 8.4 8.2*   Liver Function Tests: No results found for this basename: AST, ALT, ALKPHOS, BILITOT, PROT, ALBUMIN,  in the last 72 hours No results found for this basename: LIPASE, AMYLASE,  in the last 72 hours CBC:  Recent Labs  11/01/2013 1210  11/26/13 0937 11/27/13 0445  WBC 19.3*  < > 26.4* 24.2*  NEUTROABS 12.0*  --   --   --   HGB 4.8*  < > 10.5* 10.0*  HCT 18.0*  < > 31.7* 31.6*  MCV 69.2*  < > 75.8* 77.8*  PLT 262  < > 169 187  < > = values in this interval not displayed. Cardiac Enzymes:  Recent Labs  11/18/2013 1250 11/26/13 0015 11/27/13 0445  TROPONINI 1.16* >20.00* 5.21*   BNP: No components found with this basename: POCBNP,  D-Dimer: No results found for this basename: DDIMER,  in the last 72 hours Hemoglobin A1C: No results found for this basename: HGBA1C,  in the last 72 hours Fasting Lipid Panel:  No results found for this basename: CHOL, HDL, LDLCALC, TRIG, CHOLHDL, LDLDIRECT,  in the last 72 hours Thyroid Function Tests:  Recent Labs  11/18/2013 1847  TSH 1.230   Anemia Panel: No results found for this basename: VITAMINB12, FOLATE, FERRITIN, TIBC, IRON, RETICCTPCT,  in the last 72 hours  RADIOLOGY: Dg Chest Port 1 View  11/27/2013   CLINICAL DATA:  Respiratory failure  EXAM: PORTABLE CHEST - 1 VIEW  COMPARISON:  11/26/2013  FINDINGS: Cardiac shadow is stable. A pacing device is again seen. Right central venous line is noted in the mid superior vena cava. Bilateral pleural effusions and bibasilar infiltrates are seen worse on the left than the right. The overall appearance is stable. No bony abnormality is noted.  IMPRESSION: No change from the prior exam.   Electronically Signed   By: Inez Catalina M.D.   On: 11/27/2013  07:19   Dg Chest Port 1 View  11/26/2013   CLINICAL DATA:  Respiratory distress.  EXAM: PORTABLE CHEST - 1 VIEW  COMPARISON:  11/26/2013  FINDINGS: Cardiac pacemaker. Interval removal of endotracheal tube. Right central venous catheter is unchanged in position. Patient is rotated towards the left. The heart size is increased. Diffuse pulmonary vascular congestion. Diffuse perihilar airspace disease consistent with edema. Probable small left pleural effusion. No definite change since previous study, allowing for differences in positioning.  IMPRESSION: Cardiac enlargement with pulmonary vascular congestion and diffuse parenchymal edema bilaterally.   Electronically Signed   By: Lucienne Capers M.D.   On: 11/26/2013 23:47   Dg Chest Port 1 View  11/26/2013   CLINICAL DATA:  Respiratory failure  EXAM: PORTABLE CHEST - 1 VIEW  COMPARISON:  11/28/2013  FINDINGS: Lines and tubes in unchanged position. Cardiac enlargement stable. Vascular congestion persists but is slightly less prominent. Diffuse bilateral interstitial change of mild to moderate severity, mildly improved. Asymmetric alveolar opacity right middle lobe also improved. Small bilateral effusions.  IMPRESSION: Congestive heart failure with pulmonary edema of decreased severity compared to prior study   Electronically Signed   By: Skipper Cliche M.D.   On: 11/26/2013 07:18   Dg Chest Port 1 View  10/31/2013   CLINICAL DATA:  Hypoxia  EXAM: PORTABLE CHEST - 1 VIEW  COMPARISON:  November 25, 2013  FINDINGS: Endotracheal tube tip is 3.5 cm above the carina. Pacemaker leads are attached to the right atrium and right ventricle. Central catheter tip is in the superior vena cava. Nasogastric tube tip and side port are below the diaphragm. No pneumothorax. There is a moderate effusion with cardiomegaly and generalized interstitial edema. There is no appreciable alveolar consolidation. There is pulmonary venous hypertension. There is atherosclerotic change in  aorta. No adenopathy.  IMPRESSION: Tube and catheter positions as described without appreciable pneumothorax. Congestive heart failure.   Electronically Signed   By: Lowella Grip M.D.   On: 11/24/2013 13:30   Dg Chest Port 1 View  11/12/2013   CLINICAL DATA:  Status post cardiac arrest.  EXAM: PORTABLE CHEST - 1 VIEW  COMPARISON:  Two-view chest x-ray 09/24/2013  FINDINGS: Heart is enlarged. The patient is now intubated. The endotracheal tube terminates 2.7 cm of above the carina and could be pulled back 1-2 cm for more optimal positioning. An NG tube courses off the inferior border of the film. Cardiac pacing wires are stable. Diffuse interstitial edema has increased. Bibasilar airspace disease is noted. Pleural effusions are suspected.  IMPRESSION: 1. The endotracheal tube terminates 2.7 cm above the carina  and could be pulled back 1-2 cm for more optimal positioning. 2. Cardiomegaly with increased interstitial edema and probable bilateral pleural effusions compatible with congestive heart failure. 3. Bibasilar airspace disease likely reflects atelectasis. Infection is considered less likely.   Electronically Signed   By: Lawrence Santiago M.D.   On: 11/29/2013 12:44      ASSESSMENT AND PLAN: 1. Cardiomyopathy with acute systolic heart failure s/p PEA secondary to GI bleed with severe anemia: Result of severe anemia which has since been corrected. EF 40-45% with global hypokinesis, grade I diastolic dysfunction, mild aortic stenosis, moderate mitral regurgitation, and moderately elevated pulmonary pressures, secondary to myocardial stunning post arrest. Given soft BP and pulmonary vascular congestion, I am not inclined to initiate beta blockers at the present time. Perhaps once she is stabilized in the future, metoprolol succinate can be added. Received one dose of IV Lasix as BP soft. Will continue to monitor. Troponins trending down. Would not use any antiplatelet agents at the present time and given  propensity for GI bleeding with AVM's, unlikely to use in the future. May get thoracentesis.  2. PVD: Moderate carotid artery disease, followed by vascular surgery.  3. Essential HTN: Currently low normal. On amlodipine at home.  4. Pacemaker for complete heart block: Normal device function by interrogation on 7/10, with no high ventricular rates and 16 mode switches.   Kate Sable, M.D., F.A.C.C.

## 2013-11-27 NOTE — Progress Notes (Signed)
Progress Note   Covering for Dr. Benson [place]   Subjective  feels okay.    Objective   Vital signs in last 24 hours: Temp:  [97.8 F (36.6 C)-98.6 F (37 C)] 97.8 F (36.6 C) (08/29 0756) Pulse Rate:  [82-106] 85 (08/29 0900) Resp:  [17-32] 18 (08/29 0900) BP: (91-117)/(29-66) 97/44 mmHg (08/29 0900) SpO2:  [87 %-100 %] 94 % (08/29 0900) Arterial Line BP: (137-144)/(54-57) 137/54 mmHg (08/28 1100) FiO2 (%):  [40 %-60 %] 60 % (08/29 0800) Last BM Date: 11/16/2013 General:    Pleasant white female in NAD Lungs: on NIPPV.  Abdomen:  Soft, nontender and nondistended. Normal bowel sounds. Extremities:  Without edema. Neurologic:  Alert and oriented,  grossly normal neurologically. Psych:  Cooperative. Normal mood and affect.  Lab Results:  Recent Labs  11/26/13 0339 11/26/13 0937 11/27/13 0445  WBC 27.8* 26.4* 24.2*  HGB 10.7* 10.5* 10.0*  HCT 32.2* 31.7* 31.6*  PLT 188 169 187   BMET  Recent Labs  11/02/2013 1800 11/26/13 0339 11/27/13 0445  NA 138 139 144  K 4.3 4.6 4.2  CL 101 103 104  CO2 21 23 29   GLUCOSE 111* 146* 121*  BUN 29* 36* 35*  CREATININE 1.32* 1.63* 1.61*  CALCIUM 8.6 8.4 8.2*   Studies/Results: Dg Chest Port 1 View  11/27/2013   CLINICAL DATA:  Respiratory failure  EXAM: PORTABLE CHEST - 1 VIEW  COMPARISON:  11/26/2013  FINDINGS: Cardiac shadow is stable. A pacing device is again seen. Right central venous line is noted in the mid superior vena cava. Bilateral pleural effusions and bibasilar infiltrates are seen worse on the left than the right. The overall appearance is stable. No bony abnormality is noted.  IMPRESSION: No change from the prior exam.   Electronically Signed   By: Inez Catalina M.D.   On: 11/27/2013 07:19   Dg Chest Port 1 View  11/26/2013   CLINICAL DATA:  Respiratory distress.  EXAM: PORTABLE CHEST - 1 VIEW  COMPARISON:  11/26/2013  FINDINGS: Cardiac pacemaker. Interval removal of endotracheal tube. Right central venous catheter is  unchanged in position. Patient is rotated towards the left. The heart size is increased. Diffuse pulmonary vascular congestion. Diffuse perihilar airspace disease consistent with edema. Probable small left pleural effusion. No definite change since previous study, allowing for differences in positioning.  IMPRESSION: Cardiac enlargement with pulmonary vascular congestion and diffuse parenchymal edema bilaterally.   Electronically Signed   By: Lucienne Capers M.D.   On: 11/26/2013 23:47   Dg Chest Port 1 View  11/26/2013   CLINICAL DATA:  Respiratory failure  EXAM: PORTABLE CHEST - 1 VIEW  COMPARISON:  11/10/2013  FINDINGS: Lines and tubes in unchanged position. Cardiac enlargement stable. Vascular congestion persists but is slightly less prominent. Diffuse bilateral interstitial change of mild to moderate severity, mildly improved. Asymmetric alveolar opacity right middle lobe also improved. Small bilateral effusions.  IMPRESSION: Congestive heart failure with pulmonary edema of decreased severity compared to prior study   Electronically Signed   By: Skipper Cliche M.D.   On: 11/26/2013 07:18   Dg Chest Port 1 View  11/19/2013   CLINICAL DATA:  Hypoxia  EXAM: PORTABLE CHEST - 1 VIEW  COMPARISON:  November 25, 2013  FINDINGS: Endotracheal tube tip is 3.5 cm above the carina. Pacemaker leads are attached to the right atrium and right ventricle. Central catheter tip is in the superior vena cava. Nasogastric tube tip and side port are below  the diaphragm. No pneumothorax. There is a moderate effusion with cardiomegaly and generalized interstitial edema. There is no appreciable alveolar consolidation. There is pulmonary venous hypertension. There is atherosclerotic change in aorta. No adenopathy.  IMPRESSION: Tube and catheter positions as described without appreciable pneumothorax. Congestive heart failure.   Electronically Signed   By: Lowella Grip M.D.   On: 11/23/2013 13:30    Previous workup:  [facility] with Dr. Arsenio Loader. A double balloon enteroscopy was performed in 2012 with findings of 6-7 AVMs in the duodenum and proximal jejunum. Her prior capsule endoscopy by Dr. Laural Golden revealed a clot in the mid small bowel. The AVMs were ablated with APC.    Assessment / Plan:   93. 80 year old female with recurrent GI bleed resulting in shock. EGD two days ago revealed large gastric AVM which was ablated. She is on BID IV PPI. 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 Followed by Dr. Laural Golden.    2. PEA arrest on admission. Currently extubated, on NIPPV.  3. Anemia of acute blood loss. Hgb stable in low 10 range post 4 units of blood for hgb of 6.   LOS: 2 days   Tye Savoy  11/27/2013, 9:47 AM   ________________________________________________________________________  Velora Heckler GI MD note:  I personally examined the patient, reviewed the data and agree with the assessment and plan described above.  Appears to have stopped bleeding.  Source unclear but likely GI AVM (gastric or small bowel).  Should continue PPI twice daily.  Ok to start clears and advance as tolerated.   Owens Loffler, MD [facility] Pager [phone]

## 2013-11-27 NOTE — Progress Notes (Signed)
PULMONARY / CRITICAL CARE MEDICINE   Name: Felicia Diaz MRN: [id number] DOB: [birth year]    ADMISSION DATE:  11/23/2013  REFERRING MD :  EDP  CHIEF COMPLAINT:  Post arrest, GI bleed   INITIAL PRESENTATION:  80 yo female with hx CHF, COPD, HTN presented 8/27 with c/o SOB, 2 day hx black tarry stools and was directed to ER by outpt MD.  On arrival became increasingly lethargic, agonal resps, PEA arrest, intubated.  PCCM called to admit.    STUDIES/SIGNIFICANT EVENTS: 8/27 admit hemorrhagic shock, UGIB, PEA arrest, intubated in ED. GI consult. Cardiology consult 8/27 EGD: Gastric AVM s/p ablation. Patchy denuded duodenal and proximal jejunal mucosa of unknown signficance 8/27 Echocardiogram: LVEF 40-45%, mod diffuse HK, grade 1 diastolic dysfunction 4/00: increased resp efforts. Placed on NIPPV.    SUBJECTIVE:  On BIPAP. No distress.    VITAL SIGNS: Temp:  [97.8 F (36.6 C)-98.6 F (37 C)] 97.8 F (36.6 C) (08/29 0756) Pulse Rate:  [82-106] 85 (08/29 0900) Resp:  [17-32] 18 (08/29 0900) BP: (91-117)/(29-66) 97/44 mmHg (08/29 0900) SpO2:  [87 %-100 %] 94 % (08/29 0900) Arterial Line BP: (137-144)/(54-57) 137/54 mmHg (08/28 1100) FiO2 (%):  [40 %-60 %] 60 % (08/29 0800) HEMODYNAMICS:   VENTILATOR SETTINGS: Vent Mode:  [-]  FiO2 (%):  [40 %-60 %] 60 % INTAKE / OUTPUT:  Intake/Output Summary (Last 24 hours) at 11/27/13 0940 Last data filed at 11/27/13 0900  Gross per 24 hour  Intake    925 ml  Output    775 ml  Net    150 ml    PHYSICAL EXAMINATION: General: NAD Neuro: MAEs, cognition intact HEENT: WNL. No oral or labial telangiectasias BIPAP mask intact  Cardiovascular:  Reg, no M Lungs: scattered rhonchi  Abdomen: soft, +BS Ext: warm, no edema   CBC Recent Labs     11/26/13  0339  11/26/13  0937  11/27/13  0445  WBC  27.8*  26.4*  24.2*  HGB  10.7*  10.5*  10.0*  HCT  32.2*  31.7*  31.6*  PLT  188  169  187    Coag's No results found for this  basename: APTT, INR,  in the last 72 hours  BMET Recent Labs     11/09/2013  1800  11/26/13  0339  11/27/13  0445  NA  138  139  144  K  4.3  4.6  4.2  CL  101  103  104  CO2  21  23  29   BUN  29*  36*  35*  CREATININE  1.32*  1.63*  1.61*  GLUCOSE  111*  146*  121*    Electrolytes Recent Labs     11/07/2013  1800  11/26/13  0339  11/27/13  0445  CALCIUM  8.6  8.4  8.2*    Sepsis Markers No results found for this basename: LACTICACIDVEN, PROCALCITON, O2SATVEN,  in the last 72 hours  ABG Recent Labs     11/26/13  0524  11/26/13  2340  11/27/13  0135  PHART  7.437  7.311*  7.360  PCO2ART  36.3  59.6*  50.6*  PO2ART  79.0*  78.2*  80.7    Liver Enzymes No results found for this basename: AST, ALT, ALKPHOS, BILITOT, ALBUMIN,  in the last 72 hours  Cardiac Enzymes Recent Labs     11/26/2013  1250  11/26/13  0015  11/27/13  0445  TROPONINI  1.16*  >20.00*  5.21*  Glucose Recent Labs     11/19/2013  2005  11/26/13  0003  11/26/13  0326  11/26/13  0814  11/26/13  1222  11/26/13  1558  GLUCAP  103*  140*  150*  175*  112*  115*    Imaging Dg Chest Port 1 View  11/27/2013   CLINICAL DATA:  Respiratory failure  EXAM: PORTABLE CHEST - 1 VIEW  COMPARISON:  11/26/2013  FINDINGS: Cardiac shadow is stable. A pacing device is again seen. Right central venous line is noted in the mid superior vena cava. Bilateral pleural effusions and bibasilar infiltrates are seen worse on the left than the right. The overall appearance is stable. No bony abnormality is noted.  IMPRESSION: No change from the prior exam.   Electronically Signed   By: Inez Catalina M.D.   On: 11/27/2013 07:19   Dg Chest Port 1 View  11/26/2013   CLINICAL DATA:  Respiratory distress.  EXAM: PORTABLE CHEST - 1 VIEW  COMPARISON:  11/26/2013  FINDINGS: Cardiac pacemaker. Interval removal of endotracheal tube. Right central venous catheter is unchanged in position. Patient is rotated towards the left. The heart  size is increased. Diffuse pulmonary vascular congestion. Diffuse perihilar airspace disease consistent with edema. Probable small left pleural effusion. No definite change since previous study, allowing for differences in positioning.  IMPRESSION: Cardiac enlargement with pulmonary vascular congestion and diffuse parenchymal edema bilaterally.   Electronically Signed   By: Lucienne Capers M.D.   On: 11/26/2013 23:47   Dg Chest Port 1 View  11/26/2013   CLINICAL DATA:  Respiratory failure  EXAM: PORTABLE CHEST - 1 VIEW  COMPARISON:  11/12/2013  FINDINGS: Lines and tubes in unchanged position. Cardiac enlargement stable. Vascular congestion persists but is slightly less prominent. Diffuse bilateral interstitial change of mild to moderate severity, mildly improved. Asymmetric alveolar opacity right middle lobe also improved. Small bilateral effusions.  IMPRESSION: Congestive heart failure with pulmonary edema of decreased severity compared to prior study   Electronically Signed   By: Skipper Cliche M.D.   On: 11/26/2013 07:18   Dg Chest Port 1 View  11/21/2013   CLINICAL DATA:  Hypoxia  EXAM: PORTABLE CHEST - 1 VIEW  COMPARISON:  November 25, 2013  FINDINGS: Endotracheal tube tip is 3.5 cm above the carina. Pacemaker leads are attached to the right atrium and right ventricle. Central catheter tip is in the superior vena cava. Nasogastric tube tip and side port are below the diaphragm. No pneumothorax. There is a moderate effusion with cardiomegaly and generalized interstitial edema. There is no appreciable alveolar consolidation. There is pulmonary venous hypertension. There is atherosclerotic change in aorta. No adenopathy.  IMPRESSION: Tube and catheter positions as described without appreciable pneumothorax. Congestive heart failure.   Electronically Signed   By: Lowella Grip M.D.   On: 11/18/2013 13:30   Dg Chest Port 1 View  11/02/2013   CLINICAL DATA:  Status post cardiac arrest.  EXAM: PORTABLE  CHEST - 1 VIEW  COMPARISON:  Two-view chest x-ray 09/24/2013  FINDINGS: Heart is enlarged. The patient is now intubated. The endotracheal tube terminates 2.7 cm of above the carina and could be pulled back 1-2 cm for more optimal positioning. An NG tube courses off the inferior border of the film. Cardiac pacing wires are stable. Diffuse interstitial edema has increased. Bibasilar airspace disease is noted. Pleural effusions are suspected.  IMPRESSION: 1. The endotracheal tube terminates 2.7 cm above the carina and could be pulled back 1-2  cm for more optimal positioning. 2. Cardiomegaly with increased interstitial edema and probable bilateral pleural effusions compatible with congestive heart failure. 3. Bibasilar airspace disease likely reflects atelectasis. Infection is considered less likely.   Electronically Signed   By: Lawrence Santiago M.D.   On: 11/10/2013 12:44  pcxr; Progressive bilateral edema/effusions  ASSESSMENT / PLAN:  PULMONARY ETT 8/27>> 8/28 Acute respiratory failure post arrest Pulm edema pattern on CXR (vs TRALI). prob bilateral effusions  Hx COPD >increased resp distress. Now on BIPAP   P:   BIPAP PRN [place] chest... If sig pleural fluid think she would benefit from thora/ diagnostic and therapeutic  Supp O2 to maintain SpO2 > 92% Cont PRN albuterol Lasix x 1 (BP borderline) Recheck CXR AM 8/30  CARDIOVASCULAR CVL L IJ CVL 8/27>>> A: Hemorrhagic shock PEA arrest due to hemorrhagic shock Elevated trop I (> 20 on 8/28) -->trops trending down  Moderately impaired LV function - likely mostly stunned myocardium post arrest Hx complete heart block - s/p pacemaker P:  Cards following Monitor BP and rhythm Cannot receive anticoagulants presently   RENAL Hyponatremia, resolved AKI, nonoliguric scr stable from 8/28 P:   Monitor BMET intermittently Monitor I/Os Correct electrolytes as indicated   GASTROINTESTINAL UGIB, resolved Gastric AVM - s/p ablation 8/27 H/O  colon AVM per daughter P:   Cont PPI Advance diet as per GI service   HEMATOLOGIC Acute blood loss anemia, no evidence of ongoing bleeding P:  DVT px: PAS Monitor CBC intermittently Transfuse per usual ICU guidelines  INFECTIOUS No active issue  P:   Monitor wbc, fever curve off abx   ENDOCRINE Stress induced hyperglycemia without prior hs of DM P:   Monitor glu off SSI - resume for glu > 180 F/U HgbA1c sent on admission  NEUROLOGIC ICU associated discomfort P:   RASS goal: 0 Low dose PRN fentanyl    TODAY'S SUMMARY:   Increased WOB. Will diurese and look w/ [place]. prob needs thora.   CRITICAL CARE: The patient is critically ill with multiple organ systems failure and requires high complexity decision making for assessment and support, frequent evaluation and titration of therapies, application of advanced monitoring technologies and extensive interpretation of multiple databases. Critical Care Time devoted to patient care services described in this note is 35 minutes.   Rush Farmer, M.D. [facility] Pulmonary/Critical Care Medicine. Pager: [phone]. After hours pager: [phone].

## 2013-11-28 ENCOUNTER — Inpatient Hospital Stay (HOSPITAL_COMMUNITY): Payer: Medicare Other

## 2013-11-28 DIAGNOSIS — D509 Iron deficiency anemia, unspecified: Secondary | ICD-10-CM

## 2013-11-28 DIAGNOSIS — I6529 Occlusion and stenosis of unspecified carotid artery: Secondary | ICD-10-CM

## 2013-11-28 LAB — COMPREHENSIVE METABOLIC PANEL
ALT: 44 U/L — ABNORMAL HIGH (ref 0–35)
ANION GAP: 10 (ref 5–15)
AST: 22 U/L (ref 0–37)
Albumin: 2.3 g/dL — ABNORMAL LOW (ref 3.5–5.2)
Alkaline Phosphatase: 60 U/L (ref 39–117)
BUN: 36 mg/dL — AB (ref 6–23)
CALCIUM: 8.3 mg/dL — AB (ref 8.4–10.5)
CO2: 32 mEq/L (ref 19–32)
Chloride: 106 mEq/L (ref 96–112)
Creatinine, Ser: 1.46 mg/dL — ABNORMAL HIGH (ref 0.50–1.10)
GFR calc non Af Amer: 33 mL/min — ABNORMAL LOW (ref >90)
GFR, EST AFRICAN AMERICAN: 38 mL/min — AB (ref >90)
GLUCOSE: 119 mg/dL — AB (ref 70–99)
Potassium: 3.4 mEq/L — ABNORMAL LOW (ref 3.7–5.3)
Sodium: 148 mEq/L — ABNORMAL HIGH (ref 137–147)
TOTAL PROTEIN: 5.1 g/dL — AB (ref 6.0–8.3)
Total Bilirubin: 0.7 mg/dL (ref 0.3–1.2)

## 2013-11-28 LAB — POCT I-STAT 3, ART BLOOD GAS (G3+)
Acid-Base Excess: 7 mmol/L — ABNORMAL HIGH (ref 0.0–2.0)
Acid-Base Excess: 4 mmol/L — ABNORMAL HIGH (ref 0.0–2.0)
Acid-Base Excess: 4 mmol/L — ABNORMAL HIGH (ref 0.0–2.0)
Bicarbonate: 31.4 mEq/L — ABNORMAL HIGH (ref 20.0–24.0)
Bicarbonate: 32.3 mEq/L — ABNORMAL HIGH (ref 20.0–24.0)
Bicarbonate: 31.4 mEq/L — ABNORMAL HIGH (ref 20.0–24.0)
O2 SAT: 85 %
O2 SAT: 97 %
O2 Saturation: 100 %
PCO2 ART: 43.5 mmHg (ref 35.0–45.0)
PH ART: 7.467 — AB (ref 7.350–7.450)
PH ART: 7.309 — AB (ref 7.350–7.450)
Patient temperature: 98.6
Patient temperature: 98.6
TCO2: 33 mmol/L (ref 0–100)
TCO2: 34 mmol/L (ref 0–100)
TCO2: 33 mmol/L (ref 0–100)
pCO2 arterial: 64.3 mmHg (ref 35.0–45.0)
pCO2 arterial: 59 mmHg (ref 35.0–45.0)
pH, Arterial: 7.335 — ABNORMAL LOW (ref 7.350–7.450)
pO2, Arterial: 277 mmHg — ABNORMAL HIGH (ref 80.0–100.0)
pO2, Arterial: 56 mmHg — ABNORMAL LOW (ref 80.0–100.0)
pO2, Arterial: 96 mmHg (ref 80.0–100.0)

## 2013-11-28 LAB — CBC
HEMATOCRIT: 31.6 % — AB (ref 36.0–46.0)
HEMOGLOBIN: 9.7 g/dL — AB (ref 12.0–15.0)
MCH: 24.6 pg — ABNORMAL LOW (ref 26.0–34.0)
MCHC: 30.7 g/dL (ref 30.0–36.0)
MCV: 80 fL (ref 78.0–100.0)
Platelets: 175 10*3/uL (ref 150–400)
RBC: 3.95 MIL/uL (ref 3.87–5.11)
RDW: 24 % — ABNORMAL HIGH (ref 11.5–15.5)
WBC: 18.5 10*3/uL — ABNORMAL HIGH (ref 4.0–10.5)

## 2013-11-28 LAB — PROCALCITONIN: Procalcitonin: 2.52 ng/mL

## 2013-11-28 MED ORDER — ROCURONIUM BROMIDE 50 MG/5ML IV SOLN
50.0000 mg | Freq: Once | INTRAVENOUS | Status: AC
  Administered 2013-11-28: 50 mg via INTRAVENOUS

## 2013-11-28 MED ORDER — CHLORHEXIDINE GLUCONATE 0.12 % MT SOLN
15.0000 mL | Freq: Two times a day (BID) | OROMUCOSAL | Status: DC
  Administered 2013-11-28 – 2013-12-01 (×7): 15 mL via OROMUCOSAL
  Filled 2013-11-28 (×7): qty 15

## 2013-11-28 MED ORDER — FENTANYL CITRATE 0.05 MG/ML IJ SOLN
50.0000 ug | INTRAMUSCULAR | Status: DC | PRN
  Filled 2013-11-28 (×2): qty 2

## 2013-11-28 MED ORDER — ETOMIDATE 2 MG/ML IV SOLN
20.0000 mg | Freq: Once | INTRAVENOUS | Status: AC
  Administered 2013-11-28: 20 mg via INTRAVENOUS

## 2013-11-28 MED ORDER — FENTANYL CITRATE 0.05 MG/ML IJ SOLN
INTRAMUSCULAR | Status: AC
  Administered 2013-11-28: 100 ug
  Filled 2013-11-28: qty 4

## 2013-11-28 MED ORDER — FENTANYL CITRATE 0.05 MG/ML IJ SOLN
50.0000 ug | INTRAMUSCULAR | Status: DC | PRN
  Administered 2013-11-28 – 2013-11-30 (×4): 50 ug via INTRAVENOUS
  Filled 2013-11-28 (×3): qty 2

## 2013-11-28 MED ORDER — DOCUSATE SODIUM 50 MG/5ML PO LIQD
100.0000 mg | Freq: Two times a day (BID) | ORAL | Status: DC | PRN
  Filled 2013-11-28: qty 10

## 2013-11-28 MED ORDER — MIDAZOLAM HCL 2 MG/2ML IJ SOLN
INTRAMUSCULAR | Status: AC
  Administered 2013-11-28: 1 mg
  Filled 2013-11-28: qty 4

## 2013-11-28 MED ORDER — ALBUTEROL SULFATE (2.5 MG/3ML) 0.083% IN NEBU
2.5000 mg | INHALATION_SOLUTION | Freq: Four times a day (QID) | RESPIRATORY_TRACT | Status: DC
  Administered 2013-11-28 – 2013-12-06 (×33): 2.5 mg via RESPIRATORY_TRACT
  Filled 2013-11-28 (×34): qty 3

## 2013-11-28 MED ORDER — FREE WATER
200.0000 mL | Freq: Four times a day (QID) | Status: DC
Start: 2013-11-28 — End: 2013-11-29
  Administered 2013-11-28 – 2013-11-29 (×4): 200 mL

## 2013-11-28 MED ORDER — POTASSIUM CHLORIDE 20 MEQ/15ML (10%) PO LIQD
40.0000 meq | Freq: Once | ORAL | Status: AC
Start: 2013-11-28 — End: 2013-11-28
  Administered 2013-11-28: 40 meq
  Filled 2013-11-28: qty 30

## 2013-11-28 MED ORDER — ACETYLCYSTEINE 10 % IN SOLN: 4.0000 mL | Freq: Four times a day (QID) | RESPIRATORY_TRACT | Status: DC

## 2013-11-28 MED ORDER — VANCOMYCIN HCL IN DEXTROSE 1-5 GM/200ML-% IV SOLN
1000.0000 mg | INTRAVENOUS | Status: DC
  Administered 2013-11-28: 1000 mg via INTRAVENOUS
  Filled 2013-11-28 (×2): qty 200

## 2013-11-28 MED ORDER — CETYLPYRIDINIUM CHLORIDE 0.05 % MT LIQD
7.0000 mL | Freq: Four times a day (QID) | OROMUCOSAL | Status: DC
  Administered 2013-11-28 – 2013-12-01 (×12): 7 mL via OROMUCOSAL

## 2013-11-28 MED ORDER — SODIUM CHLORIDE 0.9 % IV SOLN
250.0000 mg | Freq: Four times a day (QID) | INTRAVENOUS | Status: AC
  Administered 2013-11-28 – 2013-12-05 (×30): 250 mg via INTRAVENOUS
  Filled 2013-11-28 (×30): qty 250

## 2013-11-28 MED ORDER — ACETYLCYSTEINE 20 % IN SOLN
2.0000 mL | Freq: Four times a day (QID) | RESPIRATORY_TRACT | Status: DC
  Administered 2013-11-28 (×3): 2 mL via RESPIRATORY_TRACT
  Filled 2013-11-28 (×4): qty 4

## 2013-11-28 NOTE — Progress Notes (Addendum)
[facility] Progress Note For Drs. Adriana Mccallum   Since last GI note: Whited out left chest led to intubation, bronch this AM, copious mucous plugging removed.  No overt GI bleeding.  Has been HD stable.  Objective: Vital signs in last 24 hours: Temp:  [97.7 F (36.5 C)-98.6 F (37 C)] 98.6 F (37 C) (08/30 0718) Pulse Rate:  [50-105] 95 (08/30 0700) Resp:  [16-27] 24 (08/30 0700) BP: (97-142)/(37-73) 134/56 mmHg (08/30 0700) SpO2:  [92 %-100 %] 99 % (08/30 0700) FiO2 (%):  [50 %] 50 % (08/29 1543) Last BM Date: 10/30/2013 General: intubated Heart: regular rate and rythm Abdomen: soft, non-tender, non-distended, normal bowel sounds   Lab Results:  Recent Labs  11/26/13 0937 11/27/13 0445 11/28/13 0455  WBC 26.4* 24.2* 18.5*  HGB 10.5* 10.0* 9.7*  PLT 169 187 175  MCV 75.8* 77.8* 80.0    Recent Labs  11/26/13 0339 11/27/13 0445 11/28/13 0455  NA 139 144 148*  K 4.6 4.2 3.4*  CL 103 104 106  CO2 23 29 32  GLUCOSE 146* 121* 119*  BUN 36* 35* 36*  CREATININE 1.63* 1.61* 1.46*  CALCIUM 8.4 8.2* 8.3*    Recent Labs  11/28/13 0455  PROT 5.1*  ALBUMIN 2.3*  AST 22  ALT 44*  ALKPHOS 60  BILITOT 0.7   Medications: Scheduled Meds: . antiseptic oral rinse  7 mL Mouth Rinse BID  . antiseptic oral rinse  7 mL Mouth Rinse QID  . chlorhexidine  15 mL Mouth Rinse BID  . fentaNYL      . midazolam      . pantoprazole (PROTONIX) IV  40 mg Intravenous Q12H   Continuous Infusions:  PRN Meds:.sodium chloride, albuterol, docusate, docusate, fentaNYL, fentaNYL    Assessment/Plan: 80 y.o. female with UGI bleed, seems resolved  Still with comorbid issues, currently intubated, underwent bronch for mucous plugging.  Should continue PPI twice daily.  When extubated, Ok to resume diet as tolerated. Please call if any recurrent overt bleeding.    Milus Banister, MD  11/28/2013, 8:53 AM [facility] Pager [phone]

## 2013-11-28 NOTE — Progress Notes (Signed)
PULMONARY / CRITICAL CARE MEDICINE   Name: Felicia Diaz MRN: [id number] DOB: Apr 03, 1932    ADMISSION DATE:  11/14/2013  REFERRING MD :  EDP  CHIEF COMPLAINT:  Post arrest, GI bleed   INITIAL PRESENTATION:  80 yo female with hx CHF, COPD, HTN presented 8/27 with c/o SOB, 2 day hx black tarry stools and was directed to ER by outpt MD.  On arrival became increasingly lethargic, agonal resps, PEA arrest, intubated.  PCCM called to admit.    STUDIES/SIGNIFICANT EVENTS: 8/27 admit hemorrhagic shock, UGIB, PEA arrest, intubated in ED. GI consult. Cardiology consult 8/27 EGD: Gastric AVM s/p ablation. Patchy denuded duodenal and proximal jejunal mucosa of unknown signficance 8/27 Echocardiogram: LVEF 40-45%, mod diffuse HK, grade 1 diastolic dysfunction 2/58: increased resp efforts. Placed on NIPPV.  8/30 completely collapsed on left. Reintubated. Sputum sent. ABX resumed  SUBJECTIVE:  On BIPAP. Marked increased WOB    VITAL SIGNS: Temp:  [97.7 F (36.5 C)-98.6 F (37 C)] 98.6 F (37 C) (08/30 0718) Pulse Rate:  [50-105] 95 (08/30 0700) Resp:  [16-27] 24 (08/30 0700) BP: (97-142)/(37-73) 134/56 mmHg (08/30 0700) SpO2:  [92 %-100 %] 99 % (08/30 0700) FiO2 (%):  [50 %] 50 % (08/29 1543) HEMODYNAMICS:   VENTILATOR SETTINGS: Vent Mode:  [-]  FiO2 (%):  [50 %] 50 % INTAKE / OUTPUT:  Intake/Output Summary (Last 24 hours) at 11/28/13 0837 Last data filed at 11/28/13 0600  Gross per 24 hour  Intake    495 ml  Output   1950 ml  Net  -1455 ml    PHYSICAL EXAMINATION: General: increased WOB. Still on BIPAP  Neuro: MAEs, cognition intact HEENT: WNL. No oral or labial telangiectasias BIPAP mask intact  Cardiovascular:  Reg, no M Lungs: scattered rhonchi decreased w/ min breath sounds on left  Abdomen: soft, +BS Ext: warm, no edema   CBC Recent Labs     11/26/13  0937  11/27/13  0445  11/28/13  0455  WBC  26.4*  24.2*  18.5*  HGB  10.5*  10.0*  9.7*  HCT  31.7*  31.6*   31.6*  PLT  169  187  175    Coag's No results found for this basename: APTT, INR,  in the last 72 hours  BMET Recent Labs     11/26/13  0339  11/27/13  0445  11/28/13  0455  NA  139  144  148*  K  4.6  4.2  3.4*  CL  103  104  106  CO2  23  29  32  BUN  36*  35*  36*  CREATININE  1.63*  1.61*  1.46*  GLUCOSE  146*  121*  119*    Electrolytes Recent Labs     11/26/13  0339  11/27/13  0445  11/28/13  0455  CALCIUM  8.4  8.2*  8.3*    Sepsis Markers No results found for this basename: LACTICACIDVEN, PROCALCITON, O2SATVEN,  in the last 72 hours  ABG Recent Labs     11/26/13  0524  11/26/13  2340  11/27/13  0135  PHART  7.437  7.311*  7.360  PCO2ART  36.3  59.6*  50.6*  PO2ART  79.0*  78.2*  80.7    Liver Enzymes Recent Labs     11/28/13  0455  AST  22  ALT  44*  ALKPHOS  60  BILITOT  0.7  ALBUMIN  2.3*    Cardiac Enzymes Recent Labs  10/30/2013  1250  11/26/13  0015  11/27/13  0445  TROPONINI  1.16*  >20.00*  5.21*    Glucose Recent Labs     11/05/2013  2005  11/26/13  0003  11/26/13  0326  11/26/13  0814  11/26/13  1222  11/26/13  1558  GLUCAP  103*  140*  150*  175*  112*  115*    Imaging Dg Chest Port 1 View  11/28/2013   CLINICAL DATA:  80 year old female respiratory distress. Intubated. Initial encounter.  EXAM: PORTABLE CHEST - 1 VIEW  COMPARISON:  11/27/2013 and earlier.  FINDINGS: Portable AP semi upright view at 0519 hrs. Stable right IJ central line.  Interval near total opacification of the left hemi thorax. Leftward shift of mediastinal structures continues. Stable to mildly decreased veiling opacity at the right lung base, with reticulonodular density throughout the aerated right lung. Stable left chest cardiac pacemaker. Extensive calcification of the thoracic aorta.  IMPRESSION: 1. Interval near complete opacification of the left hemi thorax, favor mucous plug and lung collapse. Recommend pulmonary toilet and repeat chest  radiograph. 2. Stable to mildly improved ventilation in the right lung with widespread reticulonodular opacity which could reflect interstitial edema or pneumonia.   Electronically Signed   By: Lars Pinks M.D.   On: 11/28/2013 07:24   Dg Chest Port 1 View  11/27/2013   CLINICAL DATA:  Respiratory failure  EXAM: PORTABLE CHEST - 1 VIEW  COMPARISON:  11/26/2013  FINDINGS: Cardiac shadow is stable. A pacing device is again seen. Right central venous line is noted in the mid superior vena cava. Bilateral pleural effusions and bibasilar infiltrates are seen worse on the left than the right. The overall appearance is stable. No bony abnormality is noted.  IMPRESSION: No change from the prior exam.   Electronically Signed   By: Inez Catalina M.D.   On: 11/27/2013 07:19   Dg Chest Port 1 View  11/26/2013   CLINICAL DATA:  Respiratory distress.  EXAM: PORTABLE CHEST - 1 VIEW  COMPARISON:  11/26/2013  FINDINGS: Cardiac pacemaker. Interval removal of endotracheal tube. Right central venous catheter is unchanged in position. Patient is rotated towards the left. The heart size is increased. Diffuse pulmonary vascular congestion. Diffuse perihilar airspace disease consistent with edema. Probable small left pleural effusion. No definite change since previous study, allowing for differences in positioning.  IMPRESSION: Cardiac enlargement with pulmonary vascular congestion and diffuse parenchymal edema bilaterally.   Electronically Signed   By: Lucienne Capers M.D.   On: 11/26/2013 23:47  pcxr; Progressive bilateral edema/effusions  ASSESSMENT / PLAN:  PULMONARY ETT 8/27>> 8/28 Acute respiratory failure post arrest Pulm edema pattern on CXR (vs TRALI).   Minimal bilateral effusions per [place] on 8/29 Left > right atelectasis w/ left sided collapse 8/30 leading to re-intubation  P:   Full vent support  FOB at bedside for recruitment  Keep euvolemic  Transition to ARDS protocol for PEEP  Cont BDs, add 48hrs Mucomyst   See ID section   CARDIOVASCULAR CVL L IJ CVL 8/27>>> A: Hemorrhagic shock PEA arrest due to hemorrhagic shock Elevated trop I (> 20 on 8/28) -->trops trending down  Moderately impaired LV function - likely mostly stunned myocardium post arrest Hx complete heart block - s/p pacemaker P:  Cards following Monitor BP and rhythm Cannot receive anticoagulants presently   RENAL Hyponatremia, resolved Hypernatremia as of 8/30 Hypokalemia  AKI, nonoliguric scr stable from 8/28 P:   Hold further diuresis for  now 8/30 Add free water  Replace K Monitor BMET intermittently Monitor I/Os Correct electrolytes as indicated   GASTROINTESTINAL UGIB, resolved Gastric AVM - s/p ablation 8/27 H/O colon AVM per daughter P:   Cont PPI Start tube feeds    HEMATOLOGIC Acute blood loss anemia, no evidence of ongoing bleeding P:  DVT px: PAS Monitor CBC intermittently Transfuse per usual ICU guidelines Sputum 8/30>>> vanc 8/30>>> Imipenem 8/30>>>  INFECTIOUS HCAP vs atelectasis  P:   Repeat sputum, ck PCT Will go ahead and start HCAP coverage.   ENDOCRINE Stress induced hyperglycemia without prior hs of DM P:   Monitor glu off SSI - resume for glu > 180 F/U HgbA1c sent on admission  NEUROLOGIC ICU associated discomfort P:   RASS goal: -1 Low dose PRN fentanyl    TODAY'S SUMMARY:   Needs to be placed back on vent. Bronch, sputum culture,  ARDS protocol and empiric abx coverage   CRITICAL CARE: The patient is critically ill with multiple organ systems failure and requires high complexity decision making for assessment and support, frequent evaluation and titration of therapies, application of advanced monitoring technologies and extensive interpretation of multiple databases. Critical Care Time devoted to patient care services described in this note is 35 minutes.   Patient seen and examined, agree with above note.  I dictated the care and orders written for this patient  under my direction.  Rush Farmer, MD [phone]

## 2013-11-28 NOTE — Progress Notes (Signed)
**Note De-Identified [facility] [place] Obfuscation** Sputum collected during [facility] and sent to lab.

## 2013-11-28 NOTE — Progress Notes (Signed)
ANTIBIOTIC CONSULT NOTE - INITIAL  Pharmacy Consult for vancomycin Indication: pneumonia  Allergies  Allergen Reactions  . Latex Rash  . Penicillins Itching and Rash    Patient Measurements: Height: 4\' 11"  (149.9 cm) (Stated height) Weight: 157 lb 6.5 oz (71.4 kg) IBW/kg (Calculated) : 43.2  Vital Signs: Temp: 98.6 F (37 C) (08/30 0718) Temp src: Oral (08/30 0718) BP: 134/56 mmHg (08/30 0700) Pulse Rate: 95 (08/30 0700) Intake/Output from previous day: 08/29 0701 - 08/30 0700 In: 530 [I.V.:530] Out: 1950 [Urine:1950] Intake/Output from this shift:    Labs:  Recent Labs  11/26/13 0339 11/26/13 0937 11/27/13 0445 11/28/13 0455  WBC 27.8* 26.4* 24.2* 18.5*  HGB 10.7* 10.5* 10.0* 9.7*  PLT 188 169 187 175  CREATININE 1.63*  --  1.61* 1.46*   Estimated Creatinine Clearance: 26.9 ml/min (by C-G formula based on Cr of 1.46). No results found for this basename: VANCOTROUGH, Corlis Leak, VANCORANDOM, [place], [place], [place], [place], [place], TOBRARND, AMIKACINPEAK, AMIKACINTROU, AMIKACIN,  in the last 72 hours   Microbiology: Recent Results (from the past 720 hour(s))  MRSA PCR SCREENING     Status: None   Collection Time    11/19/2013  1:56 PM      Result Value Ref Range Status   MRSA by PCR NEGATIVE  NEGATIVE Final   Comment:            The GeneXpert MRSA Assay (FDA     approved for NASAL specimens     only), is one component of a     comprehensive MRSA colonization     surveillance program. It is not     intended to diagnose MRSA     infection nor to guide or     monitor treatment for     MRSA infections.    Medical History: Past Medical History  Diagnosis Date  . CHF (congestive heart failure)     2D echo showed mild LVH and  hyperdynamic Left  ventricle with EF > 65%  . Pulmonary hypertension, mild   . Pericardial effusion     small pericardial effusion without evidence of amponade  . Hypertension   . Anemia     Admitted with severe  anemia hemoglobin 5.8 colonoscopy pending  . Mitral valve regurgitation      mild mitral stenosis and mild mitral regurgitation no personal history of rheumatic fever but history of rheumatic fever and sister   . Subendocardial infarction     In the setting of severe anemia. Mild troponin leak  . Carotid artery disease     Bilateral 70% ICA stenosis  . COPD (chronic obstructive pulmonary disease)   . Sleep apnea   . CHB (complete heart block)     St Jude Pacemaker  . CAD (coronary artery disease)   . Atrioventricular block, complete   . Complication of anesthesia     cardiac arrest during colonoscopy per family  . GERD (gastroesophageal reflux disease)   . Pacemaker   . Cardiac arrest 10/30/2013    Medications:  Prescriptions prior to admission  Medication Sig Dispense Refill  . amLODipine (NORVASC) 2.5 MG tablet Take 2.5 mg by mouth daily.       Marland Kitchen aspirin EC 81 MG tablet Take 81 mg by mouth daily.      . Calcium Carb-Cholecalciferol 600-800 MG-UNIT TABS Take 1 tablet by mouth daily.      Marland Kitchen dexlansoprazole (DEXILANT) 60 MG capsule Take 60 mg by mouth daily.      Marland Kitchen  ferrous sulfate 325 (65 FE) MG tablet Take 325 mg by mouth daily with breakfast.      . furosemide (LASIX) 20 MG tablet Take 40 mg by mouth daily.       . Multiple Vitamin (MULTIVITAMIN) tablet Take 1 tablet by mouth daily. Women's 50+      . PROAIR HFA 108 (90 BASE) MCG/ACT inhaler Inhale 2 puffs into the lungs 4 (four) times daily as needed for wheezing or shortness of breath.        Assessment: 80 year old woman who presented to the ED on 8/27.  She suffered PEA and had CPR performed for 15 minutes.  Patient also with GI bleeding.  EDG performed on 8/27 which revealed a gastric AVM which was ablated.  She required reintubation today due to whited out left chest, s/p broch which removed copious mucous plugs.  Vancomycin and imipenem to start for HCAP (has a penicillin allergy - rash)  Goal of Therapy:  Vancomycin trough  level 15-20 mcg/ml  Plan:  Measure antibiotic drug levels at steady state Follow up culture results Vancomycin 1g IV q24  Continue to monitor renal function.  Candie Mile 11/28/2013,9:26 AM

## 2013-11-28 NOTE — Procedures (Signed)
Intubation Procedure Note MARYALICE PASLEY [id number] 1932/10/25  Procedure: Intubation Indications: Respiratory insufficiency  Procedure Details Consent: Risks of procedure as well as the alternatives and risks of each were explained to the (patient/caregiver).  Consent for procedure obtained. Time Out: Verified patient identification, verified procedure, site/side was marked, verified correct patient position, special equipment/implants available, medications/allergies/relevent history reviewed, required imaging and test results available.  Performed  Maximum sterile technique was used including cap, gloves, gown, hand hygiene and mask.  MAC    Evaluation Hemodynamic Status: BP stable throughout; O2 sats: stable throughout Patient's Current Condition: stable Complications: No apparent complications Patient did tolerate procedure well. Chest X-ray ordered to verify placement.  CXR: pending.   BABCOCK,PETE 11/28/2013  I was present and supervised the procedure.  Rush Farmer, M.D. [facility] Pulmonary/Critical Care Medicine. Pager: [phone]. After hours pager: [phone].

## 2013-11-28 NOTE — Progress Notes (Signed)
UR Completed.  Felicia Diaz Jane [phone] 11/28/2013  

## 2013-11-28 NOTE — Progress Notes (Signed)
SUBJECTIVE: Pt developed left lung collapse secondary to mucous plugging requiring intubation and bronchoscopy. She nods and shakes head in response to questions. Denies chest pain.     Intake/Output Summary (Last 24 hours) at 11/28/13 1430 Last data filed at 11/28/13 1235  Gross per 24 hour  Intake    745 ml  Output   1950 ml  Net  -1205 ml    Current Facility-Administered Medications  Medication Dose Route Frequency Provider Last Rate Last Dose  . 0.9 %  sodium chloride infusion  250 mL Intravenous PRN Marijean Heath, NP 10 mL/hr at 11/12/2013 2000 250 mL at 11/15/2013 2000  . acetylcysteine (MUCOMYST) 20 % nebulizer / oral solution 2 mL  2 mL Nebulization Q6H Rush Farmer, MD   2 mL at 11/28/13 1130  . albuterol (PROVENTIL) (2.5 MG/3ML) 0.083% nebulizer solution 2.5 mg  2.5 mg Nebulization Q3H PRN Marijean Heath, NP      . albuterol (PROVENTIL) (2.5 MG/3ML) 0.083% nebulizer solution 2.5 mg  2.5 mg Nebulization Q6H Rush Farmer, MD   2.5 mg at 11/28/13 1131  . antiseptic oral rinse (CPC / CETYLPYRIDINIUM CHLORIDE 0.05%) solution 7 mL  7 mL Mouth Rinse BID Rush Farmer, MD   7 mL at 11/28/13 1000  . antiseptic oral rinse (CPC / CETYLPYRIDINIUM CHLORIDE 0.05%) solution 7 mL  7 mL Mouth Rinse QID Erick Colace, NP   7 mL at 11/28/13 1200  . chlorhexidine (PERIDEX) 0.12 % solution 15 mL  15 mL Mouth Rinse BID Erick Colace, NP   15 mL at 11/28/13 1013  . docusate (COLACE) 50 MG/5ML liquid 100 mg  100 mg Oral BID PRN Erick Colace, NP       Or  . docusate (COLACE) 50 MG/5ML liquid 100 mg  100 mg Per Tube BID PRN Erick Colace, NP      . fentaNYL (SUBLIMAZE) injection 50 mcg  50 mcg Intravenous Q15 min PRN Erick Colace, NP      . fentaNYL (SUBLIMAZE) injection 50 mcg  50 mcg Intravenous Q2H PRN Erick Colace, NP      . free water 200 mL  200 mL Per Tube 4 times per day Erick Colace, NP   200 mL at 11/28/13 1200  . imipenem-cilastatin (PRIMAXIN) 250 mg  in sodium chloride 0.9 % 100 mL IVPB  250 mg Intravenous 4 times per day Erick Colace, NP   250 mg at 11/28/13 1235  . pantoprazole (PROTONIX) injection 40 mg  40 mg Intravenous Q12H Rush Farmer, MD   40 mg at 11/28/13 1017  . vancomycin (VANCOCIN) IVPB 1000 mg/200 mL premix  1,000 mg Intravenous Q24H Rush Farmer, MD   1,000 mg at 11/28/13 1029    Filed Vitals:   11/28/13 1200 11/28/13 1215 11/28/13 1230 11/28/13 1245  BP: 128/54 134/52 113/36 106/42  Pulse: 117 111 107 103  Temp:      TempSrc:      Resp: 29 29 29 28   Height:      Weight:      SpO2: 91% 88% 90% 94%    PHYSICAL EXAM General: Intubated. HEENT: Normal. Neck: Difficult to assess JVP. Lungs: Diminished b/l with b/l rhonchi. CV: Nondisplaced PMI.  Tachycardic, normal S1/S2, no S3/S4, no murmur.  No pretibial edema. Abdomen: Soft, nontender, no hepatosplenomegaly, no distention.  Neurologic: Alert. Psych: Normal affect. Musculoskeletal: No gross deformities. Extremities: No clubbing  or cyanosis.   TELEMETRY: Reviewed telemetry pt in sinus tachycardia with intermittent AV pacing.  LABS: Basic Metabolic Panel:  Recent Labs  11/27/13 0445 11/28/13 0455  NA 144 148*  K 4.2 3.4*  CL 104 106  CO2 29 32  GLUCOSE 121* 119*  BUN 35* 36*  CREATININE 1.61* 1.46*  CALCIUM 8.2* 8.3*   Liver Function Tests:  Recent Labs  11/28/13 0455  AST 22  ALT 44*  ALKPHOS 60  BILITOT 0.7  PROT 5.1*  ALBUMIN 2.3*   No results found for this basename: LIPASE, AMYLASE,  in the last 72 hours CBC:  Recent Labs  11/27/13 0445 11/28/13 0455  WBC 24.2* 18.5*  HGB 10.0* 9.7*  HCT 31.6* 31.6*  MCV 77.8* 80.0  PLT 187 175   Cardiac Enzymes:  Recent Labs  11/26/13 0015 11/27/13 0445  TROPONINI >20.00* 5.21*   BNP: No components found with this basename: POCBNP,  D-Dimer: No results found for this basename: DDIMER,  in the last 72 hours Hemoglobin A1C: No results found for this basename: HGBA1C,  in  the last 72 hours Fasting Lipid Panel: No results found for this basename: CHOL, HDL, LDLCALC, TRIG, CHOLHDL, LDLDIRECT,  in the last 72 hours Thyroid Function Tests:  Recent Labs  11/18/2013 1847  TSH 1.230   Anemia Panel: No results found for this basename: VITAMINB12, FOLATE, FERRITIN, TIBC, IRON, RETICCTPCT,  in the last 72 hours  RADIOLOGY: Portable Chest Xray  11/28/2013   CLINICAL DATA:  Intubation.  EXAM: PORTABLE CHEST - 1 VIEW  COMPARISON:  11/28/2013.  FINDINGS: Endotracheal tube 4 cm above the carina. Right IJ line in stable position. NG tube with tip below the left hemidiaphragm. Complete collapse of the left lung with shift of the heart and mediastinum to the left noted. This is unchanged. Right lung is clear. Heart size cannot be evaluated. Cardiac pacer noted. No acute osseous abnormality.  IMPRESSION: 1. Interim extubation. Endotracheal tube tip noted 4 cm above the carina. Interim placement of NG tube, its tip is below left hemidiaphragm. Right IJ line in stable position. 2. Complete opacification of left hemithorax with shift of the heart and mediastinum to the left. These findings consistent with complete left lung atelectasis. This is unchanged.   Electronically Signed   By: Marcello Moores  Register   On: 11/28/2013 09:11   Dg Chest Port 1 View  11/28/2013   CLINICAL DATA:  80 year old female respiratory distress. Intubated. Initial encounter.  EXAM: PORTABLE CHEST - 1 VIEW  COMPARISON:  11/27/2013 and earlier.  FINDINGS: Portable AP semi upright view at 0519 hrs. Stable right IJ central line.  Interval near total opacification of the left hemi thorax. Leftward shift of mediastinal structures continues. Stable to mildly decreased veiling opacity at the right lung base, with reticulonodular density throughout the aerated right lung. Stable left chest cardiac pacemaker. Extensive calcification of the thoracic aorta.  IMPRESSION: 1. Interval near complete opacification of the left hemi  thorax, favor mucous plug and lung collapse. Recommend pulmonary toilet and repeat chest radiograph. 2. Stable to mildly improved ventilation in the right lung with widespread reticulonodular opacity which could reflect interstitial edema or pneumonia.   Electronically Signed   By: Lars Pinks M.D.   On: 11/28/2013 07:24   Dg Chest Port 1 View  11/27/2013   CLINICAL DATA:  Respiratory failure  EXAM: PORTABLE CHEST - 1 VIEW  COMPARISON:  11/26/2013  FINDINGS: Cardiac shadow is stable. A pacing device is again seen. Right  central venous line is noted in the mid superior vena cava. Bilateral pleural effusions and bibasilar infiltrates are seen worse on the left than the right. The overall appearance is stable. No bony abnormality is noted.  IMPRESSION: No change from the prior exam.   Electronically Signed   By: Inez Catalina M.D.   On: 11/27/2013 07:19   Dg Chest Port 1 View  11/26/2013   CLINICAL DATA:  Respiratory distress.  EXAM: PORTABLE CHEST - 1 VIEW  COMPARISON:  11/26/2013  FINDINGS: Cardiac pacemaker. Interval removal of endotracheal tube. Right central venous catheter is unchanged in position. Patient is rotated towards the left. The heart size is increased. Diffuse pulmonary vascular congestion. Diffuse perihilar airspace disease consistent with edema. Probable small left pleural effusion. No definite change since previous study, allowing for differences in positioning.  IMPRESSION: Cardiac enlargement with pulmonary vascular congestion and diffuse parenchymal edema bilaterally.   Electronically Signed   By: Lucienne Capers M.D.   On: 11/26/2013 23:47   Dg Chest Port 1 View  11/26/2013   CLINICAL DATA:  Respiratory failure  EXAM: PORTABLE CHEST - 1 VIEW  COMPARISON:  11/28/2013  FINDINGS: Lines and tubes in unchanged position. Cardiac enlargement stable. Vascular congestion persists but is slightly less prominent. Diffuse bilateral interstitial change of mild to moderate severity, mildly improved.  Asymmetric alveolar opacity right middle lobe also improved. Small bilateral effusions.  IMPRESSION: Congestive heart failure with pulmonary edema of decreased severity compared to prior study   Electronically Signed   By: Skipper Cliche M.D.   On: 11/26/2013 07:18   Dg Chest Port 1 View  11/12/2013   CLINICAL DATA:  Hypoxia  EXAM: PORTABLE CHEST - 1 VIEW  COMPARISON:  November 25, 2013  FINDINGS: Endotracheal tube tip is 3.5 cm above the carina. Pacemaker leads are attached to the right atrium and right ventricle. Central catheter tip is in the superior vena cava. Nasogastric tube tip and side port are below the diaphragm. No pneumothorax. There is a moderate effusion with cardiomegaly and generalized interstitial edema. There is no appreciable alveolar consolidation. There is pulmonary venous hypertension. There is atherosclerotic change in aorta. No adenopathy.  IMPRESSION: Tube and catheter positions as described without appreciable pneumothorax. Congestive heart failure.   Electronically Signed   By: Lowella Grip M.D.   On: 11/27/2013 13:30   Dg Chest Port 1 View  11/27/2013   CLINICAL DATA:  Status post cardiac arrest.  EXAM: PORTABLE CHEST - 1 VIEW  COMPARISON:  Two-view chest x-ray 09/24/2013  FINDINGS: Heart is enlarged. The patient is now intubated. The endotracheal tube terminates 2.7 cm of above the carina and could be pulled back 1-2 cm for more optimal positioning. An NG tube courses off the inferior border of the film. Cardiac pacing wires are stable. Diffuse interstitial edema has increased. Bibasilar airspace disease is noted. Pleural effusions are suspected.  IMPRESSION: 1. The endotracheal tube terminates 2.7 cm above the carina and could be pulled back 1-2 cm for more optimal positioning. 2. Cardiomegaly with increased interstitial edema and probable bilateral pleural effusions compatible with congestive heart failure. 3. Bibasilar airspace disease likely reflects atelectasis. Infection  is considered less likely.   Electronically Signed   By: Lawrence Santiago M.D.   On: 11/05/2013 12:44      ASSESSMENT AND PLAN: 1. Cardiomyopathy with acute systolic heart failure s/p PEA secondary to GI bleed with severe anemia: Result of severe anemia which has since been corrected. EF 40-45% with global  hypokinesis, grade I diastolic dysfunction, mild aortic stenosis, moderate mitral regurgitation, and moderately elevated pulmonary pressures, secondary to myocardial stunning post arrest. Given current clinical scenario with mucous plugging and left lung collapse requiring intubation, along with soft BP and previous pulmonary vascular congestion, I am not inclined to initiate beta blockers at the present time. Perhaps once she is stabilized in the future, metoprolol succinate can be added. Received one dose of IV Lasix on 8/29, and would not recommend reinstituting at the present time. Will continue to monitor. Troponins trending down. Would not use any antiplatelet agents at the present time and given propensity for GI bleeding with AVM's, unlikely to use in the future.   2. PVD: Moderate carotid artery disease, followed by vascular surgery.   3. Essential HTN: Currently low normal. On amlodipine at home.   4. Pacemaker for complete heart block: Normal device function by interrogation on 7/10, with no high ventricular rates and 16 mode switches.  5. Pulm: On Primaxin and vancomycin. Intubated. As per crit care.   Kate Sable, M.D., F.A.C.C.

## 2013-11-28 NOTE — Procedures (Signed)
Bronchoscopy Procedure Note Felicia Diaz [id number] 1932/10/19  Procedure: Bronchoscopy Indications: Remove secretions  Procedure Details Consent: Risks of procedure as well as the alternatives and risks of each were explained to the (patient/caregiver).  Consent for procedure obtained. Time Out: Verified patient identification, verified procedure, site/side was marked, verified correct patient position, special equipment/implants available, medications/allergies/relevent history reviewed, required imaging and test results available.  Performed  In preparation for procedure, patient was given 100% FiO2 and bronchoscope lubricated. Sedation: Benzodiazepines and Muscle relaxants  Airway entered and the following bronchi were examined: RUL, RML, RLL, LUL, LLL and Bronchi.   Bronchoscope removed.    Complete occlusion of the left mainstem bronchus, BAL from the LLL lateral segment with purulent material present in it.  Evaluation Hemodynamic Status: BP stable throughout; O2 sats: stable throughout Patient's Current Condition: stable Specimens:  Sent purulent fluid Complications: No apparent complications Patient did tolerate procedure well.   YACOUB,WESAM 11/28/2013

## 2013-11-29 ENCOUNTER — Inpatient Hospital Stay (HOSPITAL_COMMUNITY): Payer: Medicare Other

## 2013-11-29 LAB — POCT I-STAT 3, ART BLOOD GAS (G3+)
ACID-BASE EXCESS: 6 mmol/L — AB (ref 0.0–2.0)
Bicarbonate: 32 mEq/L — ABNORMAL HIGH (ref 20.0–24.0)
O2 SAT: 97 %
TCO2: 33 mmol/L (ref 0–100)
pCO2 arterial: 50.4 mmHg — ABNORMAL HIGH (ref 35.0–45.0)
pH, Arterial: 7.409 (ref 7.350–7.450)
pO2, Arterial: 94 mmHg (ref 80.0–100.0)

## 2013-11-29 LAB — CBC
HCT: 33.4 % — ABNORMAL LOW (ref 36.0–46.0)
Hemoglobin: 9.7 g/dL — ABNORMAL LOW (ref 12.0–15.0)
MCH: 24.6 pg — ABNORMAL LOW (ref 26.0–34.0)
MCHC: 29 g/dL — ABNORMAL LOW (ref 30.0–36.0)
MCV: 84.8 fL (ref 78.0–100.0)
PLATELETS: 174 10*3/uL (ref 150–400)
RBC: 3.94 MIL/uL (ref 3.87–5.11)
RDW: 25 % — ABNORMAL HIGH (ref 11.5–15.5)
WBC: 18.7 10*3/uL — ABNORMAL HIGH (ref 4.0–10.5)

## 2013-11-29 LAB — BLOOD GAS, ARTERIAL
ACID-BASE EXCESS: 6.9 mmol/L — AB (ref 0.0–2.0)
Bicarbonate: 32.7 mEq/L — ABNORMAL HIGH (ref 20.0–24.0)
DRAWN BY: 41881
FIO2: 0.5 %
LHR: 30 {breaths}/min
MECHVT: 290 mL
O2 SAT: 98.5 %
PEEP: 10 cmH2O
Patient temperature: 98.6
TCO2: 34.7 mmol/L (ref 0–100)
pCO2 arterial: 64.2 mmHg (ref 35.0–45.0)
pH, Arterial: 7.327 — ABNORMAL LOW (ref 7.350–7.450)
pO2, Arterial: 123 mmHg — ABNORMAL HIGH (ref 80.0–100.0)

## 2013-11-29 LAB — COMPREHENSIVE METABOLIC PANEL
ALBUMIN: 2.3 g/dL — AB (ref 3.5–5.2)
ALT: 33 U/L (ref 0–35)
AST: 17 U/L (ref 0–37)
Alkaline Phosphatase: 64 U/L (ref 39–117)
Anion gap: 12 (ref 5–15)
BILIRUBIN TOTAL: 0.6 mg/dL (ref 0.3–1.2)
BUN: 37 mg/dL — ABNORMAL HIGH (ref 6–23)
CALCIUM: 8.5 mg/dL (ref 8.4–10.5)
CHLORIDE: 108 meq/L (ref 96–112)
CO2: 30 mEq/L (ref 19–32)
CREATININE: 1.33 mg/dL — AB (ref 0.50–1.10)
GFR calc Af Amer: 43 mL/min — ABNORMAL LOW (ref >90)
GFR calc non Af Amer: 37 mL/min — ABNORMAL LOW (ref >90)
Glucose, Bld: 143 mg/dL — ABNORMAL HIGH (ref 70–99)
Potassium: 3.8 mEq/L (ref 3.7–5.3)
Sodium: 150 mEq/L — ABNORMAL HIGH (ref 137–147)
Total Protein: 5.3 g/dL — ABNORMAL LOW (ref 6.0–8.3)

## 2013-11-29 LAB — GLUCOSE, CAPILLARY
GLUCOSE-CAPILLARY: 109 mg/dL — AB (ref 70–99)
Glucose-Capillary: 108 mg/dL — ABNORMAL HIGH (ref 70–99)
Glucose-Capillary: 123 mg/dL — ABNORMAL HIGH (ref 70–99)

## 2013-11-29 LAB — PROCALCITONIN: Procalcitonin: 1.63 ng/mL

## 2013-11-29 MED ORDER — PRO-STAT SUGAR FREE PO LIQD
30.0000 mL | Freq: Two times a day (BID) | ORAL | Status: DC
  Administered 2013-11-29 (×2): 30 mL
  Filled 2013-11-29 (×6): qty 30

## 2013-11-29 MED ORDER — ACETYLCYSTEINE 20 % IN SOLN: 2.0000 mL | Freq: Four times a day (QID) | RESPIRATORY_TRACT | Status: DC

## 2013-11-29 MED ORDER — FREE WATER
200.0000 mL | Status: DC
  Administered 2013-11-29 – 2013-11-30 (×7): 200 mL

## 2013-11-29 MED ORDER — CARVEDILOL 3.125 MG PO TABS
3.1250 mg | ORAL_TABLET | Freq: Two times a day (BID) | ORAL | Status: DC
  Administered 2013-11-29 – 2013-12-03 (×9): 3.125 mg via ORAL
  Filled 2013-11-29 (×12): qty 1

## 2013-11-29 MED ORDER — ACETYLCYSTEINE 20 % IN SOLN
2.0000 mL | Freq: Four times a day (QID) | RESPIRATORY_TRACT | Status: DC
  Administered 2013-11-29: 16:00:00 via RESPIRATORY_TRACT
  Administered 2013-11-29 (×3): 2 mL via RESPIRATORY_TRACT
  Administered 2013-11-30: 08:00:00 via RESPIRATORY_TRACT
  Administered 2013-11-30 (×2): 2 mL via RESPIRATORY_TRACT
  Administered 2013-11-30: 14:00:00 via RESPIRATORY_TRACT
  Administered 2013-12-01: 2 mL via RESPIRATORY_TRACT
  Filled 2013-11-29 (×15): qty 4

## 2013-11-29 MED ORDER — VITAL HIGH PROTEIN PO LIQD
1000.0000 mL | ORAL | Status: DC
  Administered 2013-11-29 – 2013-11-30 (×3): 1000 mL
  Filled 2013-11-29 (×5): qty 1000

## 2013-11-29 MED ORDER — INSULIN ASPART 100 UNIT/ML ~~LOC~~ SOLN
0.0000 [IU] | SUBCUTANEOUS | Status: DC
Start: 2013-11-29 — End: 2013-12-06
  Administered 2013-11-29 – 2013-11-30 (×3): 2 [IU] via SUBCUTANEOUS
  Administered 2013-12-01: 1 [IU] via SUBCUTANEOUS
  Administered 2013-12-01 – 2013-12-04 (×5): 2 [IU] via SUBCUTANEOUS
  Administered 2013-12-05: 3 [IU] via SUBCUTANEOUS

## 2013-11-29 NOTE — Progress Notes (Signed)
Chart and note reviewed. Agree with note.  Kimberly Harris, RD, LDN, CNSC Pager [phone] After Hours Pager [phone]   

## 2013-11-29 NOTE — Progress Notes (Signed)
Patient Profile: 80 y.o. female with a past medical history significant for CHB s/p St Jude PTVDP Sept 2013, HTN, PVD- moderate carotid disease followed by Dr Scot Dock. Presented to [facility] ER 10/30/2013 with melena. While in ER, she had a PEA arrest Rx'd with medications and intubation. Her Hgb was 4.8. Troponin bumped to > 20. Echo with EF of 40% to 45% (decreased from prior in 2013 when EF was 60-65%) . Moderate hypokinesis of the entire myocardium. Doppler parameters consistent with abnormal left ventricular relaxation (grade 1 diastolic dysfunction). Aortic valve: There was mild stenosis. Valve area (VTI): 0.94cm^2. Valve area (Vmax): 0.93 cm^2.Valve area (Vmean): 0.88 cm^2. Moderate MR. Decreased EF felt subsequent to stunned myocardium post arrest.   EGD revealed gastric AVM, s/p ablation 11/24/2013.    Also developed left lung collapse secondary to mucus plugging requiring re-intubation 11/28/13  Subjective: Alert. Intubated. Family by bedside. When asked of any chest pain she shook her head "No".    Objective: Vital signs in last 24 hours: Temp:  [98 F (36.7 C)-101.9 F (38.8 C)] 101.9 F (38.8 C) (08/31 0800) Pulse Rate:  [43-117] 75 (08/31 0753) Resp:  [20-35] 35 (08/31 0753) BP: (87-155)/(31-70) 122/45 mmHg (08/31 0753) SpO2:  [88 %-100 %] 100 % (08/31 0753) FiO2 (%):  [40 %-60 %] 40 % (08/31 0800) Weight:  [153 lb 3.5 oz (69.5 kg)] 153 lb 3.5 oz (69.5 kg) (08/31 0600) Last BM Date: 11/20/2013  Intake/Output from previous day: 08/30 0701 - 08/31 0700 In: 1640 [I.V.:240; NG/GT:800; IV Piggyback:600] Out: 1200 [Urine:1200] Intake/Output this shift: Total I/O In: 10 [I.V.:10] Out: -   Medications Current Facility-Administered Medications  Medication Dose Route Frequency Provider Last Rate Last Dose  . 0.9 %  sodium chloride infusion  250 mL Intravenous PRN Marijean Heath, NP 10 mL/hr at 11/24/2013 2000 250 mL at 11/05/2013 2000  . acetylcysteine (MUCOMYST) 20 % nebulizer / oral  solution 2 mL  2 mL Nebulization Q6H Doree Fudge, MD   2 mL at 11/29/13 0747  . albuterol (PROVENTIL) (2.5 MG/3ML) 0.083% nebulizer solution 2.5 mg  2.5 mg Nebulization Q3H PRN Marijean Heath, NP      . albuterol (PROVENTIL) (2.5 MG/3ML) 0.083% nebulizer solution 2.5 mg  2.5 mg Nebulization Q6H Doree Fudge, MD   2.5 mg at 11/29/13 0747  . antiseptic oral rinse (CPC / CETYLPYRIDINIUM CHLORIDE 0.05%) solution 7 mL  7 mL Mouth Rinse QID Erick Colace, NP   7 mL at 11/29/13 1200  . carvedilol (COREG) tablet 3.125 mg  3.125 mg Oral BID WC Konstantin Zubelevitskiy, MD      . chlorhexidine (PERIDEX) 0.12 % solution 15 mL  15 mL Mouth Rinse BID Erick Colace, NP   15 mL at 11/29/13 0820  . docusate (COLACE) 50 MG/5ML liquid 100 mg  100 mg Oral BID PRN Erick Colace, NP       Or  . docusate (COLACE) 50 MG/5ML liquid 100 mg  100 mg Per Tube BID PRN Erick Colace, NP      . fentaNYL (SUBLIMAZE) injection 50 mcg  50 mcg Intravenous Q15 min PRN Erick Colace, NP      . fentaNYL (SUBLIMAZE) injection 50 mcg  50 mcg Intravenous Q2H PRN Erick Colace, NP   50 mcg at 11/28/13 2357  . free water 200 mL  200 mL Per Tube Q4H Doree Fudge, MD   200 mL at 11/29/13 1000  . imipenem-cilastatin (PRIMAXIN) 250 mg in sodium  chloride 0.9 % 100 mL IVPB  250 mg Intravenous 4 times per day Erick Colace, NP   250 mg at 11/29/13 1029  . insulin aspart (novoLOG) injection 0-15 Units  0-15 Units Subcutaneous 6 times per day Doree Fudge, MD      . pantoprazole (PROTONIX) injection 40 mg  40 mg Intravenous Q12H Rush Farmer, MD   40 mg at 11/29/13 1029    PE: General appearance: alert and intubated no distress Lungs: course bilateral rhonchi Heart: regular rate and rhythm Extremities: no LEE Pulses: 2+ and symmetric Skin: warm and dry Neurologic: Intubated  Lab Results:   Recent Labs  11/27/13 0445 11/28/13 0455 11/29/13 0433  WBC 24.2* 18.5*  18.7*  HGB 10.0* 9.7* 9.7*  HCT 31.6* 31.6* 33.4*  PLT 187 175 174   BMET  Recent Labs  11/27/13 0445 11/28/13 0455 11/29/13 0433  NA 144 148* 150*  K 4.2 3.4* 3.8  CL 104 106 108  CO2 29 32 30  GLUCOSE 121* 119* 143*  BUN 35* 36* 37*  CREATININE 1.61* 1.46* 1.33*  CALCIUM 8.2* 8.3* 8.5   Cardiac Panel (last 3 results)  Recent Labs  11/27/13 0445  TROPONINI 5.21*     Assessment/Plan  Principal Problem:   Cardiac arrest Active Problems:   Carotid artery disease   Rheumatic heart disease-Mild MS MR   Obstructive sleep apnea   Essential hypertension   Pacemaker-St.Jude- Sept 2013- for CHB   Acute respiratory failure   Acute post-hemorrhagic anemia   Altered mental status   Metabolic acidosis   Elevated troponin   1. S/P PEA arrest: subsequent to hemorraghic shock from acute GIB. Troponin peaked >20. Trending downward. Denies CP.   2. GIB/Acute anemia: EGD showed gastric AVM, s/p ablation. Hgb 9.7 today, unchanged from yesterday.   3. Cardiomyopathy with acute systolic heart failure: EF decreased to 40-45% (60-65% in 2013). Felt secondary to stunned myocardium post arrest. Low dose BB ordered, 3.125 mg of Coreg BID. No ACE currently due to elevated SCr 1.33. Initiate ACE/ARB therapy once improved.   4. HTN: stable in the 630Z systolic. Continue low dose Coreg.   5. Pacemaker for complete heart block: Paced rhythm on telemetry. Normal device function by interrogation on 7/10, with no high ventricular rates and 16 mode switches.  6. Febrile/leukocytosis: temp elevated at 101.9. WBC at 18.7. Management per critical care.      LOS: 4 days    Brittainy M. Ladoris Gene 11/29/2013 11:05 AM   Patient seen and examined. Agree with assessment and plan. Intubated but more comfortable on the vent today. Carvedilol started  HR in the 80's paced. EF 40 - 45% with myocardial stunning post arrest which hopefully will improve. Plan to start low dose ACE-I tomorrow if  BP allows and renal function continues to improve.    Troy Sine, MD, [facility] 11/29/2013 12:47 PM

## 2013-11-29 NOTE — Progress Notes (Addendum)
PULMONARY / CRITICAL CARE MEDICINE  Name: Felicia Diaz MRN: [id number] DOB: [birth year]    ADMISSION DATE:  11/09/2013  REFERRING MD :  EDP  CHIEF COMPLAINT:  Cardiac arrest  INITIAL PRESENTATION: 80 yo with CHF and COPD presented 8/27 with dyspnea and tarry stools. On arrival to ED became increasingly lethargic with agonal respirations, suffered PEA arrest, intubated, resuscitated.  STUDIES / SIGNIFICANT EVENTS: 8/27  Admitted with hemorrhagic shock secondary to UGIB. Suffered PEA arrest, intubated, resuscitated. GI consult. Cardiology consult 8/27  EGD >>> Gastric AVM ablated 8/27  TTE >>> EF 45%, grade 1 DD 8/30  Completely opacified left lung. Reintubated.  INTERVAL HISTORY:   L lung aeration somewhat improved overnight  VITAL SIGNS: Temp:  [98 F (36.7 C)-101.9 F (38.8 C)] 101.9 F (38.8 C) (08/31 0800) Pulse Rate:  [43-117] 75 (08/31 0753) Resp:  [16-35] 35 (08/31 0753) BP: (87-155)/(31-70) 122/45 mmHg (08/31 0753) SpO2:  [88 %-100 %] 100 % (08/31 0753) FiO2 (%):  [40 %-60 %] 40 % (08/31 0800) Weight:  [69.5 kg (153 lb 3.5 oz)] 69.5 kg (153 lb 3.5 oz) (08/31 0600)  HEMODYNAMICS:   VENTILATOR SETTINGS: Vent Mode:  [-] PRVC FiO2 (%):  [40 %-60 %] 40 % Set Rate:  [26 bmp-30 bmp] 30 bmp Vt Set:  [290 mL-330 mL] 290 mL PEEP:  [5 cmH20-10 cmH20] 5 cmH20 Plateau Pressure:  [17 cmH20-20 cmH20] 20 cmH20  INTAKE / OUTPUT: Intake/Output     08/30 0701 - 08/31 0700 08/31 0701 - 09/01 0700   I.V. (mL/kg) 240 (3.5) 65 (0.9)   NG/GT 800 400   IV Piggyback 600 100   Total Intake(mL/kg) 1640 (23.6) 565 (8.1)   Urine (mL/kg/hr) 1200 (0.7) 250 (0.4)   Total Output 1200 250   Net +440 +315         PHYSICAL EXAMINATION: General: No distress Neuro: Awake, alert, following commands HEENT: ETT Cardiovascular:  Regular, no murmurs Lungs: Bilateral air entry, few rhonchi interiorly  Abdomen: Soft, non tender abdomen Ext: No edema   LABS: CBC  Recent Labs Lab  11/27/13 0445 11/28/13 0455 11/29/13 0433  WBC 24.2* 18.5* 18.7*  HGB 10.0* 9.7* 9.7*  HCT 31.6* 31.6* 33.4*  PLT 187 175 174   Coag's No results found for this basename: APTT, INR,  in the last 168 hours  BMET  Recent Labs Lab 11/27/13 0445 11/28/13 0455 11/29/13 0433  NA 144 148* 150*  K 4.2 3.4* 3.8  CL 104 106 108  CO2 29 32 30  BUN 35* 36* 37*  CREATININE 1.61* 1.46* 1.33*  GLUCOSE 121* 119* 143*   Electrolytes  Recent Labs Lab 11/27/13 0445 11/28/13 0455 11/29/13 0433  CALCIUM 8.2* 8.3* 8.5   Sepsis Markers  Recent Labs Lab 11/16/2013 1227 11/26/13 0339 11/26/13 0900 11/28/13 0837 11/29/13 0433  LATICACIDVEN 8.13* 1.6 0.9  --   --   PROCALCITON  --   --   --  2.52 1.63   ABG  Recent Labs Lab 11/28/13 1233 11/28/13 1823 11/29/13 0327  PHART 7.309* 7.335* 7.327*  PCO2ART 64.3* 59.0* 64.2*  PO2ART 56.0* 96.0 123.0*   Liver Enzymes  Recent Labs Lab 11/28/13 0455 11/29/13 0433  AST 22 17  ALT 44* 33  ALKPHOS 60 64  BILITOT 0.7 0.6  ALBUMIN 2.3* 2.3*   Cardiac Enzymes  Recent Labs Lab 10/30/2013 1250 11/26/13 0015 11/27/13 0445  TROPONINI 1.16* >20.00* 5.21*   Glucose  Recent Labs Lab 10/30/2013 2005 11/26/13 0003 11/26/13 0326  11/26/13 0814 11/26/13 1222 11/26/13 1558  GLUCAP 103* 140* 150* 175* 112* 115*   IMAGING:  Dg Chest Port 1 View  11/29/2013   CLINICAL DATA:  Endotracheal tube placement  EXAM: PORTABLE CHEST - 1 VIEW  COMPARISON:  11/28/2013  FINDINGS: Endotracheal tube tip 2.2 cm proximal to the carina. NG tube descends below the level of the image. Left chest wall battery pack with lead tips projecting over the right atrium and right ventricle. Right IJ catheter tip projects over the proximal SVC. All wires are presumably external. Enlarged cardiac silhouette. Mitral annular calcification. Aortic atherosclerosis. Interval improved aeration of the left upper lung. There is a persistent retrocardiac opacity and left  pleural effusion. Hazy opacity right lung in part reflects layering pleural fluid. No interval osseous change.  IMPRESSION: Support devices as above, including endotracheal tube with tip 2.2 cm proximal to the carina.  Interval improved aeration of the left upper lobe. Persistent retrocardiac opacity and moderate left pleural effusion.  Small right pleural effusion and associated airspace opacity; atelectasis versus infiltrate.   Electronically Signed   By: Carlos Levering M.D.   On: 11/29/2013 06:06   Portable Chest Xray  11/28/2013   CLINICAL DATA:  Intubation.  EXAM: PORTABLE CHEST - 1 VIEW  COMPARISON:  11/28/2013.  FINDINGS: Endotracheal tube 4 cm above the carina. Right IJ line in stable position. NG tube with tip below the left hemidiaphragm. Complete collapse of the left lung with shift of the heart and mediastinum to the left noted. This is unchanged. Right lung is clear. Heart size cannot be evaluated. Cardiac pacer noted. No acute osseous abnormality.  IMPRESSION: 1. Interim extubation. Endotracheal tube tip noted 4 cm above the carina. Interim placement of NG tube, its tip is below left hemidiaphragm. Right IJ line in stable position. 2. Complete opacification of left hemithorax with shift of the heart and mediastinum to the left. These findings consistent with complete left lung atelectasis. This is unchanged.   Electronically Signed   By: Marcello Moores  Register   On: 11/28/2013 09:11   Dg Chest Port 1 View  11/28/2013   CLINICAL DATA:  80 year old female respiratory distress. Intubated. Initial encounter.  EXAM: PORTABLE CHEST - 1 VIEW  COMPARISON:  11/27/2013 and earlier.  FINDINGS: Portable AP semi upright view at 0519 hrs. Stable right IJ central line.  Interval near total opacification of the left hemi thorax. Leftward shift of mediastinal structures continues. Stable to mildly decreased veiling opacity at the right lung base, with reticulonodular density throughout the aerated right lung. Stable  left chest cardiac pacemaker. Extensive calcification of the thoracic aorta.  IMPRESSION: 1. Interval near complete opacification of the left hemi thorax, favor mucous plug and lung collapse. Recommend pulmonary toilet and repeat chest radiograph. 2. Stable to mildly improved ventilation in the right lung with widespread reticulonodular opacity which could reflect interstitial edema or pneumonia.   Electronically Signed   By: Lars Pinks M.D.   On: 11/28/2013 07:24   ASSESSMENT / PLAN:  PULMONARY A: Acute hypoxemic / hypercarbic respiratory failure, re intubated 8/30 Left lung atelectasis, improved Possible HCAP P:   Goal pH>7.30, SpO2>92 Continuous mechanical support ( Increase VT 290 --> 400, decrease RR 30 --> 20, ABG in 1 hour ) VAP bundle Daily SBT Trend ABG/CXR Albuterol / Mucomyst Vest chest PT   CARDIOVASCULAR A: S/p PEA arrest due to hemorrhagic shock NSTEMI Acute on chronic systolic / diastolic congestive heart failure Complete heart block s/p pacemaker P:  ASA /  Heparin contraindicated - recent hemorrhagic shock ACEI contraindicated - AKI Start Coreg 3.125 Start Lipitor  RENAL A: Hypernatremia Hypokalemia  AKI P:   Trend BMP Free water, increase to q4h  GASTROINTESTINAL A: Gastric AVM s/p ablation 8/27 H/o colon AVM per daughter GUI px Nutritin P:   Protonix Start TF per [employer]  HEMATOLOGIC A: Acute blood loss anemia, no evidence of ongoing bleeding DVT Px P:  Trend CBC SCD  INFECTIOUS A: Possible HCAP MRSA PCR negative P:   D/c Vancomycin 8/30 >>> 8/31 Meropenem 8/30>>> Follow resp cx 8/30 >>>  ENDOCRINE A: Hyperglycemia P:   Start SSI  NEUROLOGIC A: ICU associated discomfort P:   RASS goal 0 to -1 Fentanyl PRN  I have personally obtained history, examined patient, evaluated and interpreted laboratory and imaging results, reviewed medical records, formulated assessment / plan and placed orders.  CRITICAL CARE:  The  patient is critically ill with multiple organ systems failure and requires high complexity decision making for assessment and support, frequent evaluation and titration of therapies, application of advanced monitoring technologies and extensive interpretation of multiple databases. Critical Care Time devoted to patient care services described in this note is 35 minutes.   Doree Fudge, MD Pulmonary and [place] Pager: [phone]  11/29/2013, 9:23 AM

## 2013-11-29 NOTE — Progress Notes (Signed)
INITIAL NUTRITION ASSESSMENT  DOCUMENTATION CODES Per approved criteria  -Obesity Unspecified   INTERVENTION:  Utilize 20M PEPuP Protocol: initiate TF via OGT with Vital HP at 25 ml/h and Prostat 30 ml BID on day 1; on day 2, increase to goal rate of 35 ml/h (840 ml per day) to provide 1040 kcals (24 kcal/kg IBW), 104 gm protein(100%), 702 ml free water daily.   NUTRITION DIAGNOSIS: Inadequate oral intake related to inability to eat as evidenced by NPO status.  Goal: Enteral nutrition to provide 60-70% of estimated calorie needs (22-25 kcals/kg ideal body weight) and 100% of estimated protein needs, based on ASPEN guidelines for permissive underfeeding in critically ill obese individuals  Monitor:  TF regimen & tolerance, respiratory status, weight, labs, I/O's  Reason for Assessment: Consult received to initiate and manage enteral nutrition support.  Admitting Dx: Cardiac arrest  ASSESSMENT: 80 yo female with hx CHF, COPD, HTN presented 8/27 with c/o SOB, 2 day hx black tarry stools and was directed to ER by outpt MD. On arrival became increasingly lethargic, agonal resps, PEA arrest, intubated.Pt developed left lung collapse secondary to mucous plugging requiring intubation and bronchoscopy.   Patient is currently intubated on ventilator support MV: 8.3 L/min Temp (24hrs), Avg:99 F (37.2 C), Min:98 F (36.7 C), Max:101.9 F (38.8 C)  Nutrition focused physical exam shows no sign of depletion of muscle mass or body fat.  Labs reviewed: High Sodium Glucose 143 High BUN and Creatinine   Height: Ht Readings from Last 1 Encounters:  11/28/13 4\' 10"  (1.473 m)    Weight: Wt Readings from Last 1 Encounters:  11/29/13 153 lb 3.5 oz (69.5 kg)    Ideal Body Weight: 97 lb       % Ideal Body Weight: 158%  Wt Readings from Last 10 Encounters:  11/29/13 153 lb 3.5 oz (69.5 kg)  11/29/13 153 lb 3.5 oz (69.5 kg)  08/11/13 152 lb (68.947 kg)  12/15/12 146 lb (66.225 kg)   08/12/12 142 lb (64.411 kg)  04/06/12 142 lb (64.411 kg)  02/12/12 136 lb (61.689 kg)  01/06/12 131 lb (59.421 kg)  12/24/11 137 lb 8 oz (62.37 kg)  12/24/11 137 lb 8 oz (62.37 kg)    Usual Body Weight: NA  % Usual Body Weight: NA  BMI:  Body mass index is 32.03 kg/(m^2).  Estimated Nutritional Needs: Kcal: 1025 Protein: 90-110g Fluid: 1.3 L/day  Skin: intact  Diet Order: NPO  EDUCATION NEEDS: -No education needs identified at this time   Intake/Output Summary (Last 24 hours) at 11/29/13 1025 Last data filed at 11/29/13 0800  Gross per 24 hour  Intake   1620 ml  Output   1200 ml  Net    420 ml    Last BM: 8/27   Labs:   Recent Labs Lab 11/27/13 0445 11/28/13 0455 11/29/13 0433  NA 144 148* 150*  K 4.2 3.4* 3.8  CL 104 106 108  CO2 29 32 30  BUN 35* 36* 37*  CREATININE 1.61* 1.46* 1.33*  CALCIUM 8.2* 8.3* 8.5  GLUCOSE 121* 119* 143*    CBG (last 3)   Recent Labs  11/26/13 1222 11/26/13 1558  GLUCAP 112* 115*    Scheduled Meds: . acetylcysteine  2 mL Nebulization Q6H  . albuterol  2.5 mg Nebulization Q6H  . antiseptic oral rinse  7 mL Mouth Rinse QID  . carvedilol  3.125 mg Oral BID WC  . chlorhexidine  15 mL Mouth Rinse BID  .  free water  200 mL Per Tube Q4H  . imipenem-cilastatin  250 mg Intravenous 4 times per day  . insulin aspart  0-15 Units Subcutaneous 6 times per day  . pantoprazole (PROTONIX) IV  40 mg Intravenous Q12H    Continuous Infusions:   Past Medical History  Diagnosis Date  . CHF (congestive heart failure)     2D echo showed mild LVH and  hyperdynamic Left  ventricle with EF > 65%  . Pulmonary hypertension, mild   . Pericardial effusion     small pericardial effusion without evidence of amponade  . Hypertension   . Anemia     Admitted with severe anemia hemoglobin 5.8 colonoscopy pending  . Mitral valve regurgitation      mild mitral stenosis and mild mitral regurgitation no personal history of rheumatic fever  but history of rheumatic fever and sister   . Subendocardial infarction     In the setting of severe anemia. Mild troponin leak  . Carotid artery disease     Bilateral 70% ICA stenosis  . COPD (chronic obstructive pulmonary disease)   . Sleep apnea   . CHB (complete heart block)     St Jude Pacemaker  . CAD (coronary artery disease)   . Atrioventricular block, complete   . Complication of anesthesia     cardiac arrest during colonoscopy per family  . GERD (gastroesophageal reflux disease)   . Pacemaker   . Cardiac arrest 11/10/2013    Past Surgical History  Procedure Laterality Date  . Abdominal hysterectomy    . Breast lumpectomy    . Givens capsule study  01/02/2011    Procedure: GIVENS CAPSULE STUDY;  Surgeon: Rogene Houston, MD;  Location: [facility];  Service: Endoscopy;  Laterality: N/A;  7:30 am  . Tonsillectomy and adenoidectomy    . Pacemaker placement  12-23-2011  . Insert / replace / remove pacemaker    . Bladder tumor excision  05/2013    [facility]   [facility]  . Esophagogastroduodenoscopy N/A 11/20/2013    Procedure: ESOPHAGOGASTRODUODENOSCOPY (EGD);  Surgeon: Beryle Beams, MD;  Location: [facility] ENDOSCOPY;  Service: Endoscopy;  Laterality: N/A;    Clayton Bibles, [place], [place] Licensed Dietitian Nutritionist Pager: [phone]

## 2013-11-30 ENCOUNTER — Inpatient Hospital Stay (HOSPITAL_COMMUNITY): Payer: Medicare Other

## 2013-11-30 DIAGNOSIS — J9600 Acute respiratory failure, unspecified whether with hypoxia or hypercapnia: Secondary | ICD-10-CM

## 2013-11-30 LAB — CULTURE, RESPIRATORY: Gram Stain: NONE SEEN

## 2013-11-30 LAB — CBC
HCT: 32 % — ABNORMAL LOW (ref 36.0–46.0)
HEMOGLOBIN: 9.4 g/dL — AB (ref 12.0–15.0)
MCH: 25 pg — AB (ref 26.0–34.0)
MCHC: 29.4 g/dL — ABNORMAL LOW (ref 30.0–36.0)
MCV: 85.1 fL (ref 78.0–100.0)
Platelets: 181 10*3/uL (ref 150–400)
RBC: 3.76 MIL/uL — AB (ref 3.87–5.11)
RDW: 25.8 % — ABNORMAL HIGH (ref 11.5–15.5)
WBC: 13.7 10*3/uL — ABNORMAL HIGH (ref 4.0–10.5)

## 2013-11-30 LAB — BASIC METABOLIC PANEL
ANION GAP: 9 (ref 5–15)
Anion gap: 10 (ref 5–15)
BUN: 41 mg/dL — ABNORMAL HIGH (ref 6–23)
BUN: 36 mg/dL — ABNORMAL HIGH (ref 6–23)
CALCIUM: 8.4 mg/dL (ref 8.4–10.5)
CHLORIDE: 111 meq/L (ref 96–112)
CHLORIDE: 107 meq/L (ref 96–112)
CO2: 31 meq/L (ref 19–32)
CO2: 31 meq/L (ref 19–32)
CREATININE: 1.07 mg/dL (ref 0.50–1.10)
Calcium: 8.4 mg/dL (ref 8.4–10.5)
Creatinine, Ser: 1.01 mg/dL (ref 0.50–1.10)
GFR calc Af Amer: 56 mL/min — ABNORMAL LOW (ref >90)
GFR calc non Af Amer: 48 mL/min — ABNORMAL LOW (ref >90)
GFR calc non Af Amer: 52 mL/min — ABNORMAL LOW (ref >90)
GFR, EST AFRICAN AMERICAN: 60 mL/min — AB (ref >90)
Glucose, Bld: 111 mg/dL — ABNORMAL HIGH (ref 70–99)
Glucose, Bld: 127 mg/dL — ABNORMAL HIGH (ref 70–99)
POTASSIUM: 3.7 meq/L (ref 3.7–5.3)
Potassium: 3.7 mEq/L (ref 3.7–5.3)
Sodium: 152 mEq/L — ABNORMAL HIGH (ref 137–147)
Sodium: 147 mEq/L (ref 137–147)

## 2013-11-30 LAB — URINE CULTURE: Colony Count: >=100000

## 2013-11-30 LAB — GLUCOSE, CAPILLARY
GLUCOSE-CAPILLARY: 125 mg/dL — AB (ref 70–99)
Glucose-Capillary: 106 mg/dL — ABNORMAL HIGH (ref 70–99)
Glucose-Capillary: 111 mg/dL — ABNORMAL HIGH (ref 70–99)
Glucose-Capillary: 114 mg/dL — ABNORMAL HIGH (ref 70–99)
Glucose-Capillary: 137 mg/dL — ABNORMAL HIGH (ref 70–99)

## 2013-11-30 LAB — CULTURE, RESPIRATORY W GRAM STAIN

## 2013-11-30 LAB — PROCALCITONIN: PROCALCITONIN: 0.79 ng/mL

## 2013-11-30 LAB — PHOSPHORUS: Phosphorus: 2.3 mg/dL (ref 2.3–4.6)

## 2013-11-30 LAB — MAGNESIUM: Magnesium: 2.5 mg/dL (ref 1.5–2.5)

## 2013-11-30 MED ORDER — DEXTROSE 5 % IV SOLN
INTRAVENOUS | Status: DC
  Administered 2013-11-30: 100 mL via INTRAVENOUS
  Administered 2013-11-30 – 2013-12-01 (×2): via INTRAVENOUS

## 2013-11-30 MED ORDER — MIDAZOLAM HCL 2 MG/2ML IJ SOLN
INTRAMUSCULAR | Status: AC
  Filled 2013-11-30: qty 2

## 2013-11-30 MED ORDER — ETOMIDATE 2 MG/ML IV SOLN
20.0000 mg | Freq: Once | INTRAVENOUS | Status: AC
  Administered 2013-11-30: 20 mg via INTRAVENOUS

## 2013-11-30 MED ORDER — FENTANYL CITRATE 0.05 MG/ML IJ SOLN
INTRAMUSCULAR | Status: AC
  Filled 2013-11-30: qty 2

## 2013-11-30 MED ORDER — ETOMIDATE 2 MG/ML IV SOLN
INTRAVENOUS | Status: AC
  Filled 2013-11-30: qty 10

## 2013-11-30 MED ORDER — MIDAZOLAM HCL 2 MG/2ML IJ SOLN
2.0000 mg | Freq: Once | INTRAMUSCULAR | Status: AC
  Administered 2013-11-30: 2 mg via INTRAVENOUS

## 2013-11-30 MED ORDER — FENTANYL CITRATE 0.05 MG/ML IJ SOLN
100.0000 ug | Freq: Once | INTRAMUSCULAR | Status: AC
  Administered 2013-11-30: 100 ug via INTRAVENOUS

## 2013-11-30 NOTE — Progress Notes (Signed)
PULMONARY / CRITICAL CARE MEDICINE  Name: Felicia Diaz MRN: [id number] DOB: [birth year]    ADMISSION DATE:  11/08/2013  REFERRING MD :  EDP  CHIEF COMPLAINT:  Cardiac arrest  INITIAL PRESENTATION: 80 yo with CHF and COPD presented 8/27 with dyspnea and tarry stools. On arrival to ED became increasingly lethargic with agonal respirations, suffered PEA arrest, intubated, resuscitated.  STUDIES / SIGNIFICANT EVENTS: 8/27  Admitted with hemorrhagic shock secondary to UGIB. Suffered PEA arrest, intubated, resuscitated. GI consult. Cardiology consult 8/27  EGD >>> Gastric AVM ablated 8/27  TTE >>> EF 45%, grade 1 DD 8/30  Completely opacified left lung. Reintubated.  INTERVAL HISTORY:   Persistent L lung atelectasis  VITAL SIGNS: Temp:  [97.8 F (36.6 C)-99.1 F (37.3 C)] 98.3 F (36.8 C) (09/01 0843) Pulse Rate:  [36-160] 93 (09/01 0801) Resp:  [18-30] 24 (09/01 0801) BP: (114-144)/(33-120) 141/43 mmHg (09/01 0801) SpO2:  [91 %-100 %] 97 % (09/01 0802) FiO2 (%):  [40 %] 40 % (09/01 0803) Weight:  [70.3 kg (154 lb 15.7 oz)] 70.3 kg (154 lb 15.7 oz) (09/01 0500)  HEMODYNAMICS:   VENTILATOR SETTINGS: Vent Mode:  [-] PRVC FiO2 (%):  [40 %] 40 % Set Rate:  [20 bmp] 20 bmp Vt Set:  [400 mL] 400 mL PEEP:  [8 cmH20] 8 cmH20 Plateau Pressure:  [18 cmH20-26 cmH20] 22 cmH20  INTAKE / OUTPUT: Intake/Output     08/31 0701 - 09/01 0700 09/01 0701 - 09/02 0700   I.V. (mL/kg) 225 (3.2) 10 (0.1)   NG/GT 1604.9 35   IV Piggyback 400    Total Intake(mL/kg) 2229.9 (31.7) 45 (0.6)   Urine (mL/kg/hr) 1250 (0.7) 75 (0.5)   Total Output 1250 75   Net +979.9 -30         PHYSICAL EXAMINATION: General: Ventilated, synchronous Neuro: Awake, alert, following commands HEENT: ETT with purulent / bloody secretions Cardiovascular:  Regular, no murmurs Lungs: Bilateral rhonchi Abdomen: Soft, non tender abdomen Ext: No edema   LABS: CBC  Recent Labs Lab 11/28/13 0455 11/29/13 0433  11/30/13 0535  WBC 18.5* 18.7* 13.7*  HGB 9.7* 9.7* 9.4*  HCT 31.6* 33.4* 32.0*  PLT 175 174 181   Coag's No results found for this basename: APTT, INR,  in the last 168 hours  BMET  Recent Labs Lab 11/28/13 0455 11/29/13 0433 11/30/13 0535  NA 148* 150* 152*  K 3.4* 3.8 3.7  CL 106 108 111  CO2 32 30 31  BUN 36* 37* 41*  CREATININE 1.46* 1.33* 1.07  GLUCOSE 119* 143* 111*   Electrolytes  Recent Labs Lab 11/28/13 0455 11/29/13 0433 11/30/13 0535  CALCIUM 8.3* 8.5 8.4  MG  --   --  2.5  PHOS  --   --  2.3   Sepsis Markers  Recent Labs Lab 11/11/2013 1227 11/26/13 0339 11/26/13 0900 11/28/13 0837 11/29/13 0433 11/30/13 0535  LATICACIDVEN 8.13* 1.6 0.9  --   --   --   PROCALCITON  --   --   --  2.52 1.63 0.79   ABG  Recent Labs Lab 11/28/13 1823 11/29/13 0327 11/29/13 1143  PHART 7.335* 7.327* 7.409  PCO2ART 59.0* 64.2* 50.4*  PO2ART 96.0 123.0* 94.0   Liver Enzymes  Recent Labs Lab 11/28/13 0455 11/29/13 0433  AST 22 17  ALT 44* 33  ALKPHOS 60 64  BILITOT 0.7 0.6  ALBUMIN 2.3* 2.3*   Cardiac Enzymes  Recent Labs Lab 11/27/2013 1250 11/26/13 0015 11/27/13 0445  TROPONINI 1.16* >20.00* 5.21*   Glucose  Recent Labs Lab 11/29/13 1351 11/29/13 1522 11/29/13 2003 11/29/13 2344 11/30/13 0424 11/30/13 0817  GLUCAP 108* 123* 109* 137* 106* 125*   IMAGING:  Dg Chest Port 1 View  11/30/2013   CLINICAL DATA:  Evaluate for infiltrate  EXAM: PORTABLE CHEST - 1 VIEW  COMPARISON:  11/29/2013  FINDINGS: Endotracheal tube ends between the clavicular heads and carina. Gastric suction tube ends below the diaphragm. Unchanged positioning of a right IJ central line, tip in the region of the SVC (the more medial appearance is related to leftward rotation). No interval displacement of dual-chamber left approach pacer leads.  Given distortion from rotation, cardiomegaly and upper mediastinal contours are stable from prior. Bulky mitral annular  calcification again noted.  Increasing hazy density of the lower chest compatible of layering effusions and likely atelectasis. Opacification of the retrocardiac lung is dense. Pulmonary venous congestion.  IMPRESSION: 1. Unchanged positioning of tubes and central line. 2. Increased opacification of the lower chest, increasing or redistributing pleural effusions with atelectasis. 3. Effusion and atelectasis could obscure pneumonia at the bases.   Electronically Signed   By: Jorje Guild M.D.   On: 11/30/2013 07:24   Dg Chest Port 1 View  11/29/2013   CLINICAL DATA:  Endotracheal tube placement  EXAM: PORTABLE CHEST - 1 VIEW  COMPARISON:  11/28/2013  FINDINGS: Endotracheal tube tip 2.2 cm proximal to the carina. NG tube descends below the level of the image. Left chest wall battery pack with lead tips projecting over the right atrium and right ventricle. Right IJ catheter tip projects over the proximal SVC. All wires are presumably external. Enlarged cardiac silhouette. Mitral annular calcification. Aortic atherosclerosis. Interval improved aeration of the left upper lung. There is a persistent retrocardiac opacity and left pleural effusion. Hazy opacity right lung in part reflects layering pleural fluid. No interval osseous change.  IMPRESSION: Support devices as above, including endotracheal tube with tip 2.2 cm proximal to the carina.  Interval improved aeration of the left upper lobe. Persistent retrocardiac opacity and moderate left pleural effusion.  Small right pleural effusion and associated airspace opacity; atelectasis versus infiltrate.   Electronically Signed   By: Carlos Levering M.D.   On: 11/29/2013 06:06   ASSESSMENT / PLAN:  PULMONARY A: Acute hypoxemic / hypercarbic respiratory failure, re intubated 8/30 Left lung atelectasis ( recurrent, bronchoscopy this admission ) Possible L effusion Possible HCAP P:   Goal pH>7.30, SpO2>92 Continuous mechanical support VAP bundle Daily  SBT Trend ABG/CXR Albuterol / Mucomyst Vest chest PT  Therapeutic bronchoscopy today [place] vs CT assessment of L pleural space, possibly thoracentesis ( ideally after extubated / off positive pressure )  CARDIOVASCULAR A: S/p PEA arrest due to hemorrhagic shock NSTEMI Acute on chronic systolic / diastolic congestive heart failure Complete heart block s/p pacemaker P:  ASA / Heparin contraindicated - recent hemorrhagic shock ACEI contraindicated - AKI Coreg, Lipitor  RENAL A: Hypernatremia Hypokalemia  AKI P:   Trend BMP Free water q4h Add D5W@100   GASTROINTESTINAL A: Gastric AVM s/p ablation 8/27 H/o colon AVM per daughter GUI px Nutritin P:   Protonix TF per Nutritionist  HEMATOLOGIC A: Acute blood loss anemia, no evidence of ongoing bleeding DVT Px P:  Trend CBC SCD  INFECTIOUS A: Possible HCAP ( NPOF on respiratory cx 8/30 ) MRSA PCR negative P:   Meropenem 8/30 >>>  ENDOCRINE A: Hyperglycemia P:   SSI  NEUROLOGIC A: ICU associated discomfort P:  RASS goal 0 to -1 Fentanyl PRN  I have personally obtained history, examined patient, evaluated and interpreted laboratory and imaging results, reviewed medical records, formulated assessment / plan and placed orders.  CRITICAL CARE:  The patient is critically ill with multiple organ systems failure and requires high complexity decision making for assessment and support, frequent evaluation and titration of therapies, application of advanced monitoring technologies and extensive interpretation of multiple databases. Critical Care Time devoted to patient care services described in this note is 35 minutes.   Doree Fudge, MD Pulmonary and [place] Pager: [phone]  11/30/2013, 9:11 AM

## 2013-11-30 NOTE — Procedures (Signed)
Bronchoscopy Procedure Note Felicia Diaz [id number] 06/21/1932  Procedure: Bronchoscopy Indications: Obtain specimens for culture and/or other diagnostic studies  Procedure Details Consent: Risks of procedure as well as the alternatives and risks of each were explained to the (patient/caregiver).  Consent for procedure obtained. Time Out: Verified patient identification, verified procedure, site/side was marked, verified correct patient position, special equipment/implants available, medications/allergies/relevent history reviewed, required imaging and test results available.  Performed  In preparation for procedure, patient was given 100% FiO2 and bronchoscope lubricated. Sedation: Benzodiazepines and Etomidate  Airway entered and the following bronchi were examined: RUL, RML, RLL, LUL, LLL and Bronchi. The mucosa was erythremic w/out endobronchial lesions.  There were purulent secretions suctioned from the RML and copious purulent secretions from the left lower lobe which were sent for sampling.    Procedures performed: washings from LLL  Bronchoscope removed.    Evaluation Hemodynamic Status: BP stable throughout; O2 sats: stable throughout Patient's Current Condition: stable Specimens:  Sent purulent fluid Complications: No apparent complications Patient did tolerate procedure well.   Felicia Diaz,PETE 11/30/2013

## 2013-11-30 NOTE — Progress Notes (Signed)
Video bronchoscopy procedure performed. Bronchial washing intervention performed. 

## 2013-11-30 NOTE — Procedures (Signed)
Supervised and present through the entire procedure.  Mackson Botz, MD Pulmonary and Critical Care Medicine [facility] Pager: [phone]  

## 2013-11-30 NOTE — Procedures (Signed)
Bedside Bronchoscopy Procedure Note FREDRICA CAPANO [id number] 1932-04-09  Procedure: Bronchoscopy Indications: Diagnostic evaluation of the airways, Obtain specimens for culture and/or other diagnostic studies and Remove secretions  Procedure Details: ET Tube Size: 7.5 ET Tube secured at lip (cm):23 Bite block in place: Yes In preparation for procedure, Patient hyper-oxygenated with 100 % FiO2 Airway entered and the following bronchi were examined: RUL, RML, RLL, LUL, LLL and Bronchi.   Bronchoscope removed.    Evaluation BP 118/38  Pulse 63  Temp(Src) 98.3 F (36.8 C) (Oral)  Resp 22  Ht 4\' 10"  (1.473 m)  Wt 154 lb 15.7 oz (70.3 kg)  BMI 32.40 kg/m2  SpO2 100% Breath Sounds:Rhonch O2 sats: stable throughout Patient's Current Condition: stable Specimens:  Bronchial washing Complications: No apparent complications Patient did tolerate procedure well.   Sharyne Richters 11/30/2013, 11:46 AM

## 2013-11-30 NOTE — Progress Notes (Signed)
Pt. Was given a flutter valve due to refusal to wear chest vest. Pt. Was instructed on flutter x 10 with good effort. Pt. Is aware of how to use flutter & how often.

## 2013-11-30 NOTE — Progress Notes (Signed)
Felicia Miracle, MD Pulmonary and Critical Care Medicine [facility] Pager: [phone]  

## 2013-11-30 NOTE — Procedures (Signed)
Extubation Procedure Note  Patient Details:   Name: Felicia Diaz DOB: Jan 14, 1933 MRN: [id number]   Airway Documentation:     Evaluation  O2 sats: stable throughout Complications: No apparent complications Patient did tolerate procedure well. Bilateral Breath Sounds: Rhonchi;Diminished Suctioning: Oral;Airway Yes  Patient extubated to [place]. No complications. Vital signs stable at this time. RT will continue to monitor.  Mcneil Sober 11/30/2013, 3:54 PM

## 2013-11-30 NOTE — Progress Notes (Signed)
Patient Profile: 80 y.o. female with a past medical history significant for CHB s/p St Jude PTVDP Sept 2013, HTN, PVD- moderate carotid disease followed by Dr Scot Dock. Presented to [facility] ER 11/01/2013 with melena. While in ER, she had a PEA arrest Rx'd with medications and intubation. Her Hgb was 4.8. Troponin bumped to > 20. Echo with EF of 40% to 45% (decreased from prior in 2013 when EF was 60-65%) . Moderate hypokinesis of the entire myocardium. Doppler parameters consistent with abnormal left ventricular relaxation (grade 1 diastolic dysfunction). Aortic valve: There was mild stenosis. Valve area (VTI): 0.94cm^2. Valve area (Vmax): 0.93 cm^2.Valve area (Vmean): 0.88 cm^2. Moderate MR. Decreased EF felt subsequent to stunned myocardium post arrest.  EGD revealed gastric AVM, s/p ablation 11/15/2013.   Also developed left lung collapse secondary to mucus plugging requiring re-intubation 11/28/13   Subjective: S/p diagnostic bronchoscopy eariler this am so still sedated. Remains intubated.   Objective: Vital signs in last 24 hours: Temp:  [97.8 F (36.6 C)-99.1 F (37.3 C)] 98.3 F (36.8 C) (09/01 0843) Pulse Rate:  [36-160] 63 (09/01 0900) Resp:  [18-30] 22 (09/01 0900) BP: (114-144)/(33-120) 118/38 mmHg (09/01 0900) SpO2:  [91 %-100 %] 100 % (09/01 0900) FiO2 (%):  [40 %] 40 % (09/01 1000) Weight:  [154 lb 15.7 oz (70.3 kg)] 154 lb 15.7 oz (70.3 kg) (09/01 0500) Last BM Date: 11/07/2013  Intake/Output from previous day: 08/31 0701 - 09/01 0700 In: 2229.9 [I.V.:225; NG/GT:1604.9; IV Piggyback:400] Out: 1250 [Urine:1250] Intake/Output this shift: Total I/O In: 165 [I.V.:130; NG/GT:35] Out: 175 [Urine:175]  Medications Current Facility-Administered Medications  Medication Dose Route Frequency Provider Last Rate Last Dose  . 0.9 %  sodium chloride infusion  250 mL Intravenous PRN Marijean Heath, NP 10 mL/hr at 11/03/2013 2000 250 mL at 11/08/2013 2000  . acetylcysteine (MUCOMYST) 20 %  nebulizer / oral solution 2 mL  2 mL Nebulization Q6H Konstantin Zubelevitskiy, MD      . albuterol (PROVENTIL) (2.5 MG/3ML) 0.083% nebulizer solution 2.5 mg  2.5 mg Nebulization Q3H PRN Marijean Heath, NP      . albuterol (PROVENTIL) (2.5 MG/3ML) 0.083% nebulizer solution 2.5 mg  2.5 mg Nebulization Q6H Doree Fudge, MD   2.5 mg at 11/30/13 0748  . antiseptic oral rinse (CPC / CETYLPYRIDINIUM CHLORIDE 0.05%) solution 7 mL  7 mL Mouth Rinse QID Erick Colace, NP   7 mL at 11/30/13 0400  . carvedilol (COREG) tablet 3.125 mg  3.125 mg Oral BID WC Doree Fudge, MD   3.125 mg at 11/30/13 0801  . chlorhexidine (PERIDEX) 0.12 % solution 15 mL  15 mL Mouth Rinse BID Erick Colace, NP   15 mL at 11/30/13 0801  . dextrose 5 % solution   Intravenous Continuous Doree Fudge, MD 100 mL/hr at 11/30/13 1000 100 mL at 11/30/13 1000  . docusate (COLACE) 50 MG/5ML liquid 100 mg  100 mg Oral BID PRN Erick Colace, NP       Or  . docusate (COLACE) 50 MG/5ML liquid 100 mg  100 mg Per Tube BID PRN Erick Colace, NP      . etomidate (AMIDATE) 2 MG/ML injection           . feeding supplement (PRO-STAT SUGAR FREE 64) liquid 30 mL  30 mL Per Tube BID Dalene Carrow, RD   30 mL at 11/29/13 2122  . feeding supplement (VITAL HIGH PROTEIN) liquid 1,000 mL  1,000 mL Per Tube Q24H  Dalene Carrow, RD   1,000 mL at 11/30/13 0555  . fentaNYL (SUBLIMAZE) injection 50 mcg  50 mcg Intravenous Q15 min PRN Erick Colace, NP      . fentaNYL (SUBLIMAZE) injection 50 mcg  50 mcg Intravenous Q2H PRN Erick Colace, NP   50 mcg at 11/30/13 0741  . free water 200 mL  200 mL Per Tube Q4H Konstantin Zubelevitskiy, MD   200 mL at 11/30/13 0600  . imipenem-cilastatin (PRIMAXIN) 250 mg in sodium chloride 0.9 % 100 mL IVPB  250 mg Intravenous 4 times per day Erick Colace, NP   250 mg at 11/30/13 0551  . insulin aspart (novoLOG) injection 0-15 Units  0-15 Units Subcutaneous 6  times per day Doree Fudge, MD   2 Units at 11/30/13 0900  . midazolam (VERSED) injection 2 mg  2 mg Intravenous Once Doree Fudge, MD      . pantoprazole (PROTONIX) injection 40 mg  40 mg Intravenous Q12H Rush Farmer, MD   40 mg at 11/30/13 1000    PE: General appearance: sedated and intubated Lungs: rhonchi bilaterally Heart: regular rate and rhythm Extremities: 1+ edema Pulses: 2+ and symmetric Skin: warm and dry Neurologic: intubated and sedated  Lab Results:   Recent Labs  11/28/13 0455 11/29/13 0433 11/30/13 0535  WBC 18.5* 18.7* 13.7*  HGB 9.7* 9.7* 9.4*  HCT 31.6* 33.4* 32.0*  PLT 175 174 181   BMET  Recent Labs  11/28/13 0455 11/29/13 0433 11/30/13 0535  NA 148* 150* 152*  K 3.4* 3.8 3.7  CL 106 108 111  CO2 32 30 31  GLUCOSE 119* 143* 111*  BUN 36* 37* 41*  CREATININE 1.46* 1.33* 1.07  CALCIUM 8.3* 8.5 8.4     Assessment/Plan  Principal Problem:   Cardiac arrest Active Problems:   Carotid artery disease   Rheumatic heart disease-Mild MS MR   Obstructive sleep apnea   Essential hypertension   Pacemaker-St.Jude- Sept 2013- for CHB   Acute respiratory failure   Acute post-hemorrhagic anemia   Altered mental status   Metabolic acidosis   Elevated troponin  1. S/P PEA arrest: subsequent to hemorraghic shock from acute GIB. Troponin peaked >20. Trending downward. Denies CP.   2. GIB/Acute anemia: EGD showed gastric AVM, s/p ablation. Hgb 9.7 today, unchanged from yesterday.   3. Cardiomyopathy with acute systolic heart failure: EF decreased to 40-45% (60-65% in 2013). Felt secondary to stunned myocardium post arrest. Low dose Coreg, 3.125 mg BID initiated yesterday. HR stable but now borderline hypotensive (also likely secondary to sedation given for bronchoscopy this am). Renal function has improved with Scr down to 1.07. Recommend holding initiation of ACE-I therapy given BP.   4. HTN: borderline hypotensive. On low  dose Coreg.   5. Pacemaker for complete heart block: Paced rhythm on telemetry. Normal device function by interrogation on 7/10, with no high ventricular rates and 16 mode switches.     LOS: 5 days    Brittainy M. Ladoris Gene 11/30/2013 11:31 AM   Patient seen and examined. Agree with assessment and plan. Paced rhythm. Will defer initiation of ACE-I  Today., possibly initiate tomorrow depending on BP.  For CT following bronchoscopy.   Troy Sine, MD, [facility] 11/30/2013 2:05 PM

## 2013-11-30 DEATH — deceased

## 2013-12-01 ENCOUNTER — Inpatient Hospital Stay (HOSPITAL_COMMUNITY): Payer: Medicare Other

## 2013-12-01 DIAGNOSIS — Z515 Encounter for palliative care: Secondary | ICD-10-CM

## 2013-12-01 DIAGNOSIS — R0989 Other specified symptoms and signs involving the circulatory and respiratory systems: Secondary | ICD-10-CM

## 2013-12-01 DIAGNOSIS — J810 Acute pulmonary edema: Secondary | ICD-10-CM

## 2013-12-01 DIAGNOSIS — R0609 Other forms of dyspnea: Secondary | ICD-10-CM

## 2013-12-01 DIAGNOSIS — Z7189 Other specified counseling: Secondary | ICD-10-CM

## 2013-12-01 LAB — POCT I-STAT 3, ART BLOOD GAS (G3+)
Acid-Base Excess: 4 mmol/L — ABNORMAL HIGH (ref 0.0–2.0)
Bicarbonate: 32.5 mEq/L — ABNORMAL HIGH (ref 20.0–24.0)
O2 Saturation: 80 %
PCO2 ART: 69.2 mmHg — AB (ref 35.0–45.0)
PH ART: 7.278 — AB (ref 7.350–7.450)
TCO2: 35 mmol/L (ref 0–100)
pO2, Arterial: 51 mmHg — ABNORMAL LOW (ref 80.0–100.0)

## 2013-12-01 LAB — GLUCOSE, CAPILLARY
GLUCOSE-CAPILLARY: 146 mg/dL — AB (ref 70–99)
GLUCOSE-CAPILLARY: 124 mg/dL — AB (ref 70–99)
Glucose-Capillary: 102 mg/dL — ABNORMAL HIGH (ref 70–99)
Glucose-Capillary: 122 mg/dL — ABNORMAL HIGH (ref 70–99)
Glucose-Capillary: 127 mg/dL — ABNORMAL HIGH (ref 70–99)
Glucose-Capillary: 130 mg/dL — ABNORMAL HIGH (ref 70–99)
Glucose-Capillary: 144 mg/dL — ABNORMAL HIGH (ref 70–99)

## 2013-12-01 MED ORDER — FUROSEMIDE 10 MG/ML IJ SOLN
40.0000 mg | Freq: Once | INTRAMUSCULAR | Status: AC
  Administered 2013-12-01: 40 mg via INTRAVENOUS
  Filled 2013-12-01: qty 4

## 2013-12-01 MED ORDER — POTASSIUM CHLORIDE 10 MEQ/50ML IV SOLN
10.0000 meq | INTRAVENOUS | Status: AC
  Administered 2013-12-01 (×3): 10 meq via INTRAVENOUS
  Filled 2013-12-01: qty 50

## 2013-12-01 MED ORDER — WHITE PETROLATUM GEL
Status: AC
  Administered 2013-12-01: 0.2
  Filled 2013-12-01: qty 5

## 2013-12-01 NOTE — Consult Note (Signed)
Patient Felicia Diaz      DOB: 07/15/1932      HUD:[id number]     Consult Note from the Palliative Medicine Team at [place] Requested by: Dr Oliver Pila                       : [name], MD Reason for Consultation: Goals of Care    Phone Number:[phone]  Assessment/Recommendations:  1.  Code Status: DNR after discussion today, see below  2.  Goals of Care: Difficult for her to communicate extensively on BiPAP.  She had conversation with Dr Earnest Conroy this morning and is aware of concerns about needing trach if requires 3rd intubation this admission.  She does not wish to go back on the ventialtor with this concern and confirms DNR wish.  She is also able to indicate that she is okay with continuing BiPAP and trying lasix, etc to see if we can improve her breathing and get her over acute issues short of intubation, resuscitation. She has living will which also is consistent with these expressed wishes. She informs me that her nephew Felicia Diaz would make decisions for her if she was unable. I spoke with him over phone and he was not surprised by our conversation today. He expressed that she has been on vent before and came back, but understands that repeated intubations this admission makes things different.  He believes she would not want tracheostomy and longer term resp support.  Please see advance directive located in media section of chart.   3. Symptom Management:   Hypoxic Resp Failure, Dyspnea- controlled currently on BiPAP. She is tolerating mask.  Has nebs ordered. Will monitor.  Also on primaxin for resp infxn concern. Ongoing diuresis  4. Psychosocial/Spiritual: Family support mostly from nephew Felicia Diaz and Niece Felicia Diaz. Also has friend Felicia Diaz who visits often.    Brief HPI: 80 yo female with PMHx of HTN, PVD, CHB s/p pacemaker in 12/2011, COPD, AVM. She presented on 8/27 with presumed UGIB and resultant PEA arrest.  She was able to have successful  resuscitation and weaned from ventilator. She developed worsening respiratory failure requiring re-itnubation on 8/30.  She was extubated again on 9/1 but with ongoing hypoxic/hypercapnic resp failure she still requires BiPAP currently.  She is being diuresed with evidence of interstitial edema on CXR.  She has had bronchoscopy x2 for left lower lobe atelectasis with the last being done today.  Her anemia seems to have stabilized and she has had previous APC to AVMs in her duodenum and proximal jejunum per GI notes.   Currently she is tolerating BiPAP though admits to some discomfort. Denies any pain or overt dyspnea.  Not feeling hungry.  She was able to confirm concerns relayed by Dr Earnest Conroy about potential tracheostomy if repeat intubation required. She does not want this.  Also able to confirm that she is okay with continuing bipap and diuresis in hopes of improving her current situation.  She wants to continue other aggressive care short of cardiopulm resuscitation.   ROS: As above. Difficult to obtain full ROS given her BiPAP requirements.     PMH:  Past Medical History  Diagnosis Date  . CHF (congestive heart failure)     2D echo showed mild LVH and  hyperdynamic Left  ventricle with EF > 65%  . Pulmonary hypertension, mild   . Pericardial effusion     small pericardial effusion without evidence of amponade  .  Hypertension   . Anemia     Admitted with severe anemia hemoglobin 5.8 colonoscopy pending  . Mitral valve regurgitation      mild mitral stenosis and mild mitral regurgitation no personal history of rheumatic fever but history of rheumatic fever and sister   . Subendocardial infarction     In the setting of severe anemia. Mild troponin leak  . Carotid artery disease     Bilateral 70% ICA stenosis  . COPD (chronic obstructive pulmonary disease)   . Sleep apnea   . CHB (complete heart block)     St Jude Pacemaker  . CAD (coronary artery disease)   . Atrioventricular block, complete    . Complication of anesthesia     cardiac arrest during colonoscopy per family  . GERD (gastroesophageal reflux disease)   . Pacemaker   . Cardiac arrest 11/14/2013     PSH: Past Surgical History  Procedure Laterality Date  . Abdominal hysterectomy    . Breast lumpectomy    . Givens capsule study  01/02/2011    Procedure: GIVENS CAPSULE STUDY;  Surgeon: Rogene Houston, MD;  Location: [facility];  Service: Endoscopy;  Laterality: N/A;  7:30 am  . Tonsillectomy and adenoidectomy    . Pacemaker placement  12-23-2011  . Insert / replace / remove pacemaker    . Bladder tumor excision  05/2013    [facility]   [facility]  . Esophagogastroduodenoscopy N/A 11/28/2013    Procedure: ESOPHAGOGASTRODUODENOSCOPY (EGD);  Surgeon: Beryle Beams, MD;  Location: [facility] ENDOSCOPY;  Service: Endoscopy;  Laterality: N/A;   I have reviewed the [place] and SH and  If appropriate update it with new information. Allergies  Allergen Reactions  . Latex Rash  . Penicillins Itching and Rash   Scheduled Meds: . albuterol  2.5 mg Nebulization Q6H  . carvedilol  3.125 mg Oral BID WC  . furosemide  40 mg Intravenous Once  . furosemide  40 mg Intravenous Once  . imipenem-cilastatin  250 mg Intravenous 4 times per day  . insulin aspart  0-15 Units Subcutaneous 6 times per day  . [COMPLETED] potassium chloride  10 mEq Intravenous Q1 Hr x 2   Continuous Infusions:  PRN Meds:.sodium chloride, albuterol    BP 140/53  Pulse 63  Temp(Src) 97.6 F (36.4 C) (Oral)  Resp 26  Ht 4\' 10"  (1.473 m)  Wt 71.1 kg (156 lb 12 oz)  BMI 32.77 kg/m2  SpO2 91%   PPS: currently 30   Intake/Output Summary (Last 24 hours) at 12/01/13 1610 Last data filed at 12/01/13 1535  Gross per 24 hour  Intake   2775 ml  Output   1650 ml  Net   1125 ml    Physical Exam:  General: Alert, on BiPAP. No acute distress HEENT:  , sclera anicteric Chest:   CTAB anteriorly  CVS: RRR Abdomen: soft, ND Ext: mild edema Skin:  warm/dry  Labs: CBC    Component Value Date/Time   WBC 13.7* 11/30/2013 0535   RBC 3.76* 11/30/2013 0535   HGB 9.4* 11/30/2013 0535   HCT 32.0* 11/30/2013 0535   PLT 181 11/30/2013 0535   MCV 85.1 11/30/2013 0535   MCH 25.0* 11/30/2013 0535   MCHC 29.4* 11/30/2013 0535   RDW 25.8* 11/30/2013 0535   LYMPHSABS 4.6* 11/24/2013 1210   MONOABS 1.9* 11/10/2013 1210   EOSABS 0.4 11/24/2013 1210   BASOSABS 0.4* 11/19/2013 1210    BMET    Component Value Date/Time  NA 147 11/30/2013 1700   K 3.7 11/30/2013 1700   CL 107 11/30/2013 1700   CO2 31 11/30/2013 1700   GLUCOSE 127* 11/30/2013 1700   BUN 36* 11/30/2013 1700   CREATININE 1.01 11/30/2013 1700   CALCIUM 8.4 11/30/2013 1700   GFRNONAA 52* 11/30/2013 1700   GFRAA 60* 11/30/2013 1700    CMP     Component Value Date/Time   NA 147 11/30/2013 1700   K 3.7 11/30/2013 1700   CL 107 11/30/2013 1700   CO2 31 11/30/2013 1700   GLUCOSE 127* 11/30/2013 1700   BUN 36* 11/30/2013 1700   CREATININE 1.01 11/30/2013 1700   CALCIUM 8.4 11/30/2013 1700   PROT 5.3* 11/29/2013 0433   ALBUMIN 2.3* 11/29/2013 0433   AST 17 11/29/2013 0433   ALT 33 11/29/2013 0433   ALKPHOS 64 11/29/2013 0433   BILITOT 0.6 11/29/2013 0433   GFRNONAA 52* 11/30/2013 1700   GFRAA 60* 11/30/2013 1700   9/2 CXR IMPRESSION:  There there has been a interval deterioration in the appearance of  the lungs since yesterday's study with increasing interstitial edema  and likely more pleural fluid bilaterally. Underlying CHF is  suspected.   40 CT Chest IMPRESSION:  Bilateral lower lobe consolidation left greater than right with  effusions left greater than right.  Left lower lobe mucous plugging is noted.  Precarinal lymphadenopathy.    Time In: 1420 Ti me Out: 1605 Total Time: 45 minutes  Greater than 50%  of this time was spent counseling and coordinating care related to the above assessment and plan.  Doran Clay D.O. Palliative Medicine Team at [facility]  Pager: [phone] Team Phone:  [phone]

## 2013-12-01 NOTE — Progress Notes (Signed)
PULMONARY / CRITICAL CARE MEDICINE  Name: Felicia Diaz MRN: [id number] DOB: [birth year]    ADMISSION DATE:  11/11/2013  REFERRING MD :  EDP  CHIEF COMPLAINT:  Cardiac arrest  INITIAL PRESENTATION: 80 yo with CHF and COPD presented 8/27 with dyspnea and tarry stools. On arrival to ED became increasingly lethargic with agonal respirations, suffered PEA arrest, intubated, resuscitated.  STUDIES / SIGNIFICANT EVENTS: 8/27  Admitted with hemorrhagic shock secondary to UGIB. Suffered PEA arrest, intubated, resuscitated. GI consult. Cardiology consult 8/27  EGD >>> Gastric AVM ablated 8/27  TTE >>> EF 45%, grade 1 DD 8/30  Completely opacified left lung. Reintubated. 9/1    Bronchoscopy >>> purulent secretions RML and LLL aspirated; extubated 9/2    Hypoxemia / hypercarbia overnight, placed on BiPAP, awaiting HCPOA to discuss goals of care  INTERVAL HISTORY:   Hypoxemia / hypercarbia overnight, placed on BiPAP  VITAL SIGNS: Temp:  [97.5 F (36.4 C)-98.2 F (36.8 C)] 98.2 F (36.8 C) (09/02 1208) Pulse Rate:  [59-76] 60 (09/02 1200) Resp:  [21-28] 22 (09/02 1200) BP: (108-173)/(41-99) 115/48 mmHg (09/02 1200) SpO2:  [86 %-100 %] 98 % (09/02 1200) FiO2 (%):  [40 %] 40 % (09/02 0926) Weight:  [71.1 kg (156 lb 12 oz)] 71.1 kg (156 lb 12 oz) (09/02 0400)  HEMODYNAMICS:   VENTILATOR SETTINGS: Vent Mode:  [-] BIPAP FiO2 (%):  [40 %] 40 % Set Rate:  [11 bmp] 11 bmp PEEP:  [8 cmH20] 8 cmH20  INTAKE / OUTPUT: Intake/Output     09/01 0701 - 09/02 0700 09/02 0701 - 09/03 0700   P.O. 15    I.V. (mL/kg) 2410 (33.9) 550 (7.7)   NG/GT 270    IV Piggyback 400 100   Total Intake(mL/kg) 3095 (43.5) 650 (9.1)   Urine (mL/kg/hr) 1355 (0.8) 175 (0.4)   Total Output 1355 175   Net +1740 +475         PHYSICAL EXAMINATION: General: Work of breathing somewhat increased Neuro: Appears tired, but appropriate, follows commands HEENT: Dry membranes Cardiovascular:  Regular, no murmurs Lungs:  Bilateral diminished air entry, few rhonchi Abdomen: Soft, non tender abdomen Ext: No edema   LABS: CBC  Recent Labs Lab 11/28/13 0455 11/29/13 0433 11/30/13 0535  WBC 18.5* 18.7* 13.7*  HGB 9.7* 9.7* 9.4*  HCT 31.6* 33.4* 32.0*  PLT 175 174 181   Coag's No results found for this basename: APTT, INR,  in the last 168 hours  BMET  Recent Labs Lab 11/29/13 0433 11/30/13 0535 11/30/13 1700  NA 150* 152* 147  K 3.8 3.7 3.7  CL 108 111 107  CO2 30 31 31   BUN 37* 41* 36*  CREATININE 1.33* 1.07 1.01  GLUCOSE 143* 111* 127*   Electrolytes  Recent Labs Lab 11/29/13 0433 11/30/13 0535 11/30/13 1700  CALCIUM 8.5 8.4 8.4  MG  --  2.5  --   PHOS  --  2.3  --    Sepsis Markers  Recent Labs Lab 11/06/2013 1227 11/26/13 0339 11/26/13 0900 11/28/13 0837 11/29/13 0433 11/30/13 0535  LATICACIDVEN 8.13* 1.6 0.9  --   --   --   PROCALCITON  --   --   --  2.52 1.63 0.79   ABG  Recent Labs Lab 11/29/13 0327 11/29/13 1143 12/01/13 0829  PHART 7.327* 7.409 7.278*  PCO2ART 64.2* 50.4* 69.2*  PO2ART 123.0* 94.0 51.0*   Liver Enzymes  Recent Labs Lab 11/28/13 0455 11/29/13 0433  AST 22 17  ALT  44* 33  ALKPHOS 60 64  BILITOT 0.7 0.6  ALBUMIN 2.3* 2.3*   Cardiac Enzymes  Recent Labs Lab 11/16/2013 1250 11/26/13 0015 11/27/13 0445  TROPONINI 1.16* >20.00* 5.21*   Glucose  Recent Labs Lab 11/30/13 1513 11/30/13 1934 12/01/13 12/01/13 0416 12/01/13 0758 12/01/13 1144  GLUCAP 111* 130* 122* 127* 144* 146*   IMAGING:  Ct Chest Wo Contrast  11/30/2013   CLINICAL DATA:  Increased left basilar density possible pleural effusion  EXAM: CT CHEST WITHOUT CONTRAST  TECHNIQUE: Multidetector CT imaging of the chest was performed following the standard protocol without IV contrast.  COMPARISON:  11/30/2013  FINDINGS: Bilateral pleural effusions are noted left slightly greater than right of a mild-to-moderate degree. Bilateral lower lobe consolidation is noted  worse on the left than the right. Mucous plugging is noted within the left lower lobe bronchial tree. An endotracheal tube is noted in satisfactory position. A nasogastric catheter is seen coursing into the stomach. Lymphadenopathy is noted anterior to the carina best seen on image number 22 of series 201 measuring 17 mm. Some smaller prevascular nodes are seen which are not significant by size criteria. Diffuse aortic calcifications are noted. Calcifications of the mitral valve are seen as well. Scanning into the upper abdomen reveals some vascular calcifications of the kidneys bilaterally. No definitive obstructing calculi are seen. The osseous structures are grossly unremarkable. A pacing device is again seen.  IMPRESSION: Bilateral lower lobe consolidation left greater than right with effusions left greater than right.  Left lower lobe mucous plugging is noted.  Precarinal lymphadenopathy.   Electronically Signed   By: Inez Catalina M.D.   On: 11/30/2013 14:19   Dg Chest Port 1 View  12/01/2013   CLINICAL DATA:  Respiratory distress  EXAM: PORTABLE CHEST - 1 VIEW  COMPARISON:  Portable chest x-ray of November 30, 2013 and CT scan of the chest of the same date.  FINDINGS: The lungs are slightly less well inflated today. There is worsening of interstitial and now early alveolar infiltrate in the right upper lobe. Density in the right lung base is stable. The retrocardiac region remains dense with obscuration of the hemidiaphragm and portions of the heart border. The central pulmonary vascularity is prominent. The permanent pacemaker is unchanged in position. The right internal jugular venous catheter tip lies in the midportion of the SVC peer  IMPRESSION: There there has been a interval deterioration in the appearance of the lungs since yesterday's study with increasing interstitial edema and likely more pleural fluid bilaterally. Underlying CHF is suspected.   Electronically Signed   By: David  Martinique   On:  12/01/2013 09:18   Dg Chest Port 1 View  11/30/2013   CLINICAL DATA:  Evaluate for infiltrate  EXAM: PORTABLE CHEST - 1 VIEW  COMPARISON:  11/29/2013  FINDINGS: Endotracheal tube ends between the clavicular heads and carina. Gastric suction tube ends below the diaphragm. Unchanged positioning of a right IJ central line, tip in the region of the SVC (the more medial appearance is related to leftward rotation). No interval displacement of dual-chamber left approach pacer leads.  Given distortion from rotation, cardiomegaly and upper mediastinal contours are stable from prior. Bulky mitral annular calcification again noted.  Increasing hazy density of the lower chest compatible of layering effusions and likely atelectasis. Opacification of the retrocardiac lung is dense. Pulmonary venous congestion.  IMPRESSION: 1. Unchanged positioning of tubes and central line. 2. Increased opacification of the lower chest, increasing or redistributing pleural effusions  with atelectasis. 3. Effusion and atelectasis could obscure pneumonia at the bases.   Electronically Signed   By: Jorje Guild M.D.   On: 11/30/2013 07:24   ASSESSMENT / PLAN:  PULMONARY A: COPD on nocturnal oxygen baseline, no evidence of exacerbation Acute hypoxemic / hypercarbic respiratory failure, re intubated 8/30, extubated 9/1, today hypercarbic / hypoxemic again L lung atelectasis ( recurrent, bronchoscopy this admission ), s/p repeated bronchoscopy on 9/2 Small bilateral effusions on CT scan 9/2 Possible HCAP Pulmonary edema? P:   Goal pH>7.30, SpO2>92 BiPAP / NIMV Need to discuss goals of care as may require reintubation (this will the third one this admission) and eventual tracheostomy Trend ABG/CXR Albuterol D/c Mucomyst / chest PT  Do not see clear benefit from thoracentesis  CARDIOVASCULAR A: S/p PEA arrest due to hemorrhagic shock NSTEMI Acute on chronic systolic / diastolic congestive heart failure Complete heart block  s/p pacemaker P:  ASA / Heparin contraindicated - recent hemorrhagic shock ACEI contraindicated - AKI Coreg, Lipitor  RENAL A: Hypernatremia, improving Hypokalemia  AKI P:   Trend BMP D/c D5W@100  Lasix 40 mg IV q12h x 2 K 10 x 2  GASTROINTESTINAL A: Gastric AVM s/p ablation 8/27 H/o colon AVM per daughter GI Px is not indicated Nutritin P:   D/c Protonix D/c TF NPO  HEMATOLOGIC A: Acute blood loss anemia, no evidence of ongoing bleeding DVT Px P:  Trend CBC SCD  INFECTIOUS A: Possible HCAP ( NPOF on respiratory cx 8/30 ) MRSA PCR negative P:   Primaxin 8/30 >>>  ENDOCRINE A: Hyperglycemia P:   SSI  NEUROLOGIC A: No active issues P:   Fentanyl PRN  I have personally obtained history, examined patient, evaluated and interpreted laboratory and imaging results, reviewed medical records, formulated assessment / plan and placed orders.  CRITICAL CARE:  The patient is critically ill with multiple organ systems failure and requires high complexity decision making for assessment and support, frequent evaluation and titration of therapies, application of advanced monitoring technologies and extensive interpretation of multiple databases. Critical Care Time devoted to patient care services described in this note is 35 minutes.   Doree Fudge, MD Pulmonary and [place] Pager: [phone]  12/01/2013, 1:15 PM

## 2013-12-01 NOTE — Progress Notes (Signed)
RT note- placed on Bipap per MD due to respiratory status and post ABG results.

## 2013-12-02 ENCOUNTER — Inpatient Hospital Stay (HOSPITAL_COMMUNITY): Payer: Medicare Other

## 2013-12-02 DIAGNOSIS — R071 Chest pain on breathing: Secondary | ICD-10-CM

## 2013-12-02 LAB — BASIC METABOLIC PANEL
Anion gap: 8 (ref 5–15)
BUN: 25 mg/dL — AB (ref 6–23)
CALCIUM: 8.5 mg/dL (ref 8.4–10.5)
CO2: 34 mEq/L — ABNORMAL HIGH (ref 19–32)
Chloride: 106 mEq/L (ref 96–112)
Creatinine, Ser: 0.95 mg/dL (ref 0.50–1.10)
GFR calc non Af Amer: 55 mL/min — ABNORMAL LOW (ref >90)
GFR, EST AFRICAN AMERICAN: 64 mL/min — AB (ref >90)
GLUCOSE: 97 mg/dL (ref 70–99)
Potassium: 3.9 mEq/L (ref 3.7–5.3)
Sodium: 148 mEq/L — ABNORMAL HIGH (ref 137–147)

## 2013-12-02 LAB — CULTURE, RESPIRATORY

## 2013-12-02 LAB — CBC
HEMATOCRIT: 34.5 % — AB (ref 36.0–46.0)
HEMOGLOBIN: 10 g/dL — AB (ref 12.0–15.0)
MCH: 24.2 pg — AB (ref 26.0–34.0)
MCHC: 29 g/dL — ABNORMAL LOW (ref 30.0–36.0)
MCV: 83.3 fL (ref 78.0–100.0)
Platelets: 209 10*3/uL (ref 150–400)
RBC: 4.14 MIL/uL (ref 3.87–5.11)
RDW: 25.4 % — ABNORMAL HIGH (ref 11.5–15.5)
WBC: 12.6 10*3/uL — ABNORMAL HIGH (ref 4.0–10.5)

## 2013-12-02 LAB — GLUCOSE, CAPILLARY
GLUCOSE-CAPILLARY: 90 mg/dL (ref 70–99)
GLUCOSE-CAPILLARY: 96 mg/dL (ref 70–99)
GLUCOSE-CAPILLARY: 131 mg/dL — AB (ref 70–99)
GLUCOSE-CAPILLARY: 134 mg/dL — AB (ref 70–99)
Glucose-Capillary: 105 mg/dL — ABNORMAL HIGH (ref 70–99)
Glucose-Capillary: 92 mg/dL (ref 70–99)

## 2013-12-02 LAB — CULTURE, RESPIRATORY W GRAM STAIN

## 2013-12-02 MED ORDER — MORPHINE SULFATE 2 MG/ML IJ SOLN
1.0000 mg | INTRAMUSCULAR | Status: DC | PRN
  Administered 2013-12-03 – 2013-12-06 (×6): 2 mg via INTRAVENOUS
  Filled 2013-12-02 (×6): qty 1

## 2013-12-02 MED ORDER — FUROSEMIDE 10 MG/ML IJ SOLN
80.0000 mg | Freq: Two times a day (BID) | INTRAMUSCULAR | Status: AC
  Administered 2013-12-02 (×2): 80 mg via INTRAVENOUS
  Filled 2013-12-02 (×2): qty 8

## 2013-12-02 MED ORDER — OXYCODONE HCL ER 10 MG PO T12A
10.0000 mg | EXTENDED_RELEASE_TABLET | Freq: Two times a day (BID) | ORAL | Status: DC
  Administered 2013-12-02 – 2013-12-06 (×6): 10 mg via ORAL
  Filled 2013-12-02 (×7): qty 1

## 2013-12-02 NOTE — Progress Notes (Signed)
Attempted pt on HFNC with 80% and 40 Flow. Pt SPO2 dropped from 99% to 80%. Pt was then placed on BiPAP with 100% and albuterol treatment. Pt is comfortable and tolerating well at this time and SPO2 is 97%. BBS clear diminished. RT will continue to monitor.

## 2013-12-02 NOTE — Progress Notes (Signed)
Pt placed on HFNC, tolerating well at this time. 100% O2 with 55 Flow. Patient Vitals are stable SPO2 92%.

## 2013-12-02 NOTE — Progress Notes (Addendum)
PULMONARY / CRITICAL CARE MEDICINE  Name: Felicia Diaz MRN: [id number] DOB: 11-05-1932    ADMISSION DATE:  11/19/2013  REFERRING MD :  EDP  CHIEF COMPLAINT:  Cardiac arrest  INITIAL PRESENTATION: 80 yo with CHF and COPD presented 8/27 with dyspnea and tarry stools. On arrival to ED became increasingly lethargic with agonal respirations, suffered PEA arrest, intubated, resuscitated.  STUDIES / SIGNIFICANT EVENTS: 8/27  Admitted with hemorrhagic shock secondary to UGIB. Suffered PEA arrest, intubated, resuscitated. GI consult. Cardiology consult 8/27  EGD >>> Gastric AVM ablated 8/27  TTE >>> EF 45%, grade 1 DD 8/30  Completely opacified left lung. Reintubated. 9/1    Bronchoscopy >>> purulent secretions RML and LLL aspirated; extubated 9/2    Hypoxemia / hypercarbia overnight, placed on BiPAP, awaiting HCPOA to discuss goals of care 9/2    Goals of care discussion with HCPOA, patient, Palliative Care >>> DNR/DNI  INTERVAL HISTORY:   Did not have to go on BiPAP overnight, but cannot titrate off 100% oxygen.  L lung collapsed again on CXR this am.   VITAL SIGNS: Temp:  [97.1 F (36.2 C)-98.2 F (36.8 C)] 97.6 F (36.4 C) (09/03 0436) Pulse Rate:  [59-82] 82 (09/03 0600) Resp:  [18-32] 28 (09/03 0600) BP: (108-176)/(35-61) 149/59 mmHg (09/03 0600) SpO2:  [80 %-100 %] 97 % (09/03 0600) FiO2 (%):  [40 %-50 %] 40 % (09/02 1415)  HEMODYNAMICS:   VENTILATOR SETTINGS: Vent Mode:  [-] BIPAP FiO2 (%):  [40 %-50 %] 40 % Set Rate:  [10 bmp-11 bmp] 10 bmp PEEP:  [8 cmH20] 8 cmH20  INTAKE / OUTPUT: Intake/Output     09/02 0701 - 09/03 0700   I.V. (mL/kg) 720 (10.1)   IV Piggyback 550   Total Intake(mL/kg) 1270 (17.9)   Urine (mL/kg/hr) 2575 (1.5)   Stool 1 (0)   Total Output 2576   Net -1306        PHYSICAL EXAMINATION: General: Comfortable, desaturates fast when off 100% oxygen Neuro: Awake, alert HEENT: Dry membranes Cardiovascular:  Regular, no murmurs Lungs: Diminished  air entry L Abdomen: Soft, non tender abdomen Ext: Trace edema   LABS: CBC  Recent Labs Lab 11/29/13 0433 11/30/13 0535 12/02/13 0500  WBC 18.7* 13.7* 12.6*  HGB 9.7* 9.4* 10.0*  HCT 33.4* 32.0* 34.5*  PLT 174 181 209   Coag's No results found for this basename: APTT, INR,  in the last 168 hours  BMET  Recent Labs Lab 11/30/13 0535 11/30/13 1700 12/02/13 0500  NA 152* 147 148*  K 3.7 3.7 3.9  CL 111 107 106  CO2 31 31 34*  BUN 41* 36* 25*  CREATININE 1.07 1.01 0.95  GLUCOSE 111* 127* 97   Electrolytes  Recent Labs Lab 11/30/13 0535 11/30/13 1700 12/02/13 0500  CALCIUM 8.4 8.4 8.5  MG 2.5  --   --   PHOS 2.3  --   --    Sepsis Markers  Recent Labs Lab 11/24/2013 1227 11/26/13 0339 11/26/13 0900 11/28/13 0837 11/29/13 0433 11/30/13 0535  LATICACIDVEN 8.13* 1.6 0.9  --   --   --   PROCALCITON  --   --   --  2.52 1.63 0.79   ABG  Recent Labs Lab 11/29/13 0327 11/29/13 1143 12/01/13 0829  PHART 7.327* 7.409 7.278*  PCO2ART 64.2* 50.4* 69.2*  PO2ART 123.0* 94.0 51.0*   Liver Enzymes  Recent Labs Lab 11/28/13 0455 11/29/13 0433  AST 22 17  ALT 44* 33  ALKPHOS 60  64  BILITOT 0.7 0.6  ALBUMIN 2.3* 2.3*   Cardiac Enzymes  Recent Labs Lab 11/13/2013 1250 11/26/13 0015 11/27/13 0445  TROPONINI 1.16* >20.00* 5.21*   Glucose  Recent Labs Lab 12/01/13 0758 12/01/13 1144 12/01/13 1535 12/01/13 1904 12/01/13 2348 12/02/13 0423  GLUCAP 144* 146* 124* 102* 105* 90   IMAGING:  Ct Chest Wo Contrast  11/30/2013   CLINICAL DATA:  Increased left basilar density possible pleural effusion  EXAM: CT CHEST WITHOUT CONTRAST  TECHNIQUE: Multidetector CT imaging of the chest was performed following the standard protocol without IV contrast.  COMPARISON:  11/30/2013  FINDINGS: Bilateral pleural effusions are noted left slightly greater than right of a mild-to-moderate degree. Bilateral lower lobe consolidation is noted worse on the left than the  right. Mucous plugging is noted within the left lower lobe bronchial tree. An endotracheal tube is noted in satisfactory position. A nasogastric catheter is seen coursing into the stomach. Lymphadenopathy is noted anterior to the carina best seen on image number 22 of series 201 measuring 17 mm. Some smaller prevascular nodes are seen which are not significant by size criteria. Diffuse aortic calcifications are noted. Calcifications of the mitral valve are seen as well. Scanning into the upper abdomen reveals some vascular calcifications of the kidneys bilaterally. No definitive obstructing calculi are seen. The osseous structures are grossly unremarkable. A pacing device is again seen.  IMPRESSION: Bilateral lower lobe consolidation left greater than right with effusions left greater than right.  Left lower lobe mucous plugging is noted.  Precarinal lymphadenopathy.   Electronically Signed   By: Inez Catalina M.D.   On: 11/30/2013 14:19   Dg Chest Port 1 View  12/01/2013   CLINICAL DATA:  Respiratory distress  EXAM: PORTABLE CHEST - 1 VIEW  COMPARISON:  Portable chest x-ray of November 30, 2013 and CT scan of the chest of the same date.  FINDINGS: The lungs are slightly less well inflated today. There is worsening of interstitial and now early alveolar infiltrate in the right upper lobe. Density in the right lung base is stable. The retrocardiac region remains dense with obscuration of the hemidiaphragm and portions of the heart border. The central pulmonary vascularity is prominent. The permanent pacemaker is unchanged in position. The right internal jugular venous catheter tip lies in the midportion of the SVC peer  IMPRESSION: There there has been a interval deterioration in the appearance of the lungs since yesterday's study with increasing interstitial edema and likely more pleural fluid bilaterally. Underlying CHF is suspected.   Electronically Signed   By: David  Martinique   On: 12/01/2013 09:18   ASSESSMENT  / PLAN:  PULMONARY A: COPD on nocturnal oxygen baseline, no evidence of exacerbation Acute hypoxemic / hypercarbic respiratory failure, re intubated 8/30, extubated 9/1 L lung atelectasis, recurrent, s/p repeated bronchoscopy on 9/2 Small bilateral effusions on CT scan 9/2 Possible HCAP Pulmonary edema P:   Goal pH>7.30, SpO2>92 BiPAP / NIMV 4 hours on, 4 hours off High flow [place] between BiPAP sessions Trend ABG/CXR Albuterol Do not see clear benefit from thoracentesis DNI  CARDIOVASCULAR A: S/p PEA arrest due to hemorrhagic shock NSTEMI Acute on chronic systolic / diastolic congestive heart failure Complete heart block s/p pacemaker P:  ASA / Heparin contraindicated - recent hemorrhagic shock ACEI contraindicated - AKI Coreg, Lipitor DNR  RENAL A: Hypernatremia Hypokalemia  AKI P:   Trend BMP Lasix 80 mg IV q12h x 2 more doses  GASTROINTESTINAL A: Gastric AVM s/p ablation  8/27 H/o colon AVM per daughter GI Px is not indicated Nutritin P:   NPO  HEMATOLOGIC A: Acute blood loss anemia, no evidence of ongoing bleeding DVT Px P:  Trend CBC SCD  INFECTIOUS A: Possible HCAP ( NPOF on respiratory cx 8/30 ) MRSA PCR negative P:   Primaxin 8/30 >>> 3/6  ENDOCRINE A: Hyperglycemia P:   SSI  NEUROLOGIC A: Chest wall pain from CPR Deconditioning P:   Add OxyContin 10 bid Add Morphine PRN  Out of bed  I have personally obtained history, examined patient, evaluated and interpreted laboratory and imaging results, reviewed medical records, formulated assessment / plan and placed orders.  CRITICAL CARE:  The patient is critically ill with multiple organ systems failure and requires high complexity decision making for assessment and support, frequent evaluation and titration of therapies, application of advanced monitoring technologies and extensive interpretation of multiple databases. Critical Care Time devoted to patient care services described in this  note is 35 minutes.   Doree Fudge, MD Pulmonary and [place] Pager: [phone]  12/02/2013, 6:40 AM

## 2013-12-02 NOTE — Progress Notes (Signed)
Patient Felicia Diaz      DOB: Jul 04, 1932      PJA:[id number]   Palliative Medicine Team at [facility] Progress Note    Subjective: On high flow [name] this mornng. Can speak few words but hard to talk and does get SOB with this.  Had some chest pain with movement for which she was started on oxycontin and PRN morphine.  She has no other acute complaints. Spoke with nephew at bedside.  Felicia Diaz recalls conversation with me from yesterday.      Filed Vitals:   12/02/13 0920  BP:   Pulse:   Temp:   Resp: 28   Physical exam: General: Alert, on High Flow [name] HEENT: Felicia Diaz, sclera anicteric  Chest: CTAB anteriorly  CVS: RRR  Abdomen: soft, ND  Skin: warm/dry      CBC    Component Value Date/Time   WBC 12.6* 12/02/2013 0500   RBC 4.14 12/02/2013 0500   HGB 10.0* 12/02/2013 0500   HCT 34.5* 12/02/2013 0500   PLT 209 12/02/2013 0500   MCV 83.3 12/02/2013 0500   MCH 24.2* 12/02/2013 0500   MCHC 29.0* 12/02/2013 0500   RDW 25.4* 12/02/2013 0500   LYMPHSABS 4.6* 11/29/2013 1210   MONOABS 1.9* 11/26/2013 1210   EOSABS 0.4 10/30/2013 1210   BASOSABS 0.4* 11/06/2013 1210    CMP     Component Value Date/Time   NA 148* 12/02/2013 0500   K 3.9 12/02/2013 0500   CL 106 12/02/2013 0500   CO2 34* 12/02/2013 0500   GLUCOSE 97 12/02/2013 0500   BUN 25* 12/02/2013 0500   CREATININE 0.95 12/02/2013 0500   CALCIUM 8.5 12/02/2013 0500   PROT 5.3* 11/29/2013 0433   ALBUMIN 2.3* 11/29/2013 0433   AST 17 11/29/2013 0433   ALT 33 11/29/2013 0433   ALKPHOS 64 11/29/2013 0433   BILITOT 0.6 11/29/2013 0433   GFRNONAA 55* 12/02/2013 0500   GFRAA 64* 12/02/2013 0500   9/3 CXR IMPRESSION:  There has been further interval deterioration in the appearance of  the left hemithorax due to accumulation of further pleural fluid,  atelectasis, or a combination of both. On the right increased  interstitial markings persist consistent with edema of cardiac or  noncardiac cause.    Assessment and plan: 80 yo female with PMHx of HTN, PVD,  CHB s/p pacemaker in 12/2011, COPD, AVM. She presented on 8/27 with presumed UGIB and resultant PEA arrest. Stay complicated by hypoxia/hypercarbia requiring repeat intubation.    1. Code Status:  DNR   2. Goals of Care:  See initial consult note.  Transitioned to high flow today.  CXR concerning for worsening atelectasis vs edema.  Hopes to improve from resp standpoint but would not want 3rd intubation which would likely lead to tracheostomy. Spoke with nephew at bedside and he is aware of current plan.    3. Symptom Management:  Hypoxic Resp Failure, Dyspnea- ongoing diuresis, mucolytics, etc.  On high flow [place].  Tenuous resp status.   Chest Pain-with movement and likely MSK after CPR.  Started on oxycontin 10mg  q12h and PRN morphine. While she is not totally opioid naive (had fentanyl bolus doses on vent) would be cautious with oxycontin.  Most of pain is situational and long acting medicine may lead to excess sedation. If starting to become issue would d/c oxycontin and just use morphine PRN.  Suspect she will be okay as this will also help with some of her dyspnea sensation.  4. Psychosocial/Spiritual: Family support mostly from nephew Felicia Diaz and Niece Felicia Diaz. Also has friend Felicia Diaz who visits often.     Felicia Diaz D.O. Palliative Medicine Team at [facility]  Pager: [phone] Team Phone: [phone]

## 2013-12-02 NOTE — Progress Notes (Signed)
Pt not tolerating BIPAP at this time, taken off BIPAP and placed on 100% NRB by RN for pills and mouth care. Pt tolerating well at this time. Will attempt HFNC again.

## 2013-12-03 ENCOUNTER — Inpatient Hospital Stay (HOSPITAL_COMMUNITY): Payer: Medicare Other

## 2013-12-03 DIAGNOSIS — J9819 Other pulmonary collapse: Secondary | ICD-10-CM

## 2013-12-03 DIAGNOSIS — J90 Pleural effusion, not elsewhere classified: Secondary | ICD-10-CM

## 2013-12-03 LAB — GLUCOSE, CAPILLARY
GLUCOSE-CAPILLARY: 104 mg/dL — AB (ref 70–99)
Glucose-Capillary: 103 mg/dL — ABNORMAL HIGH (ref 70–99)
Glucose-Capillary: 107 mg/dL — ABNORMAL HIGH (ref 70–99)
Glucose-Capillary: 156 mg/dL — ABNORMAL HIGH (ref 70–99)
Glucose-Capillary: 93 mg/dL (ref 70–99)
Glucose-Capillary: 94 mg/dL (ref 70–99)

## 2013-12-03 LAB — CBC
HCT: 34.6 % — ABNORMAL LOW (ref 36.0–46.0)
HEMOGLOBIN: 10 g/dL — AB (ref 12.0–15.0)
MCH: 24 pg — ABNORMAL LOW (ref 26.0–34.0)
MCHC: 28.9 g/dL — AB (ref 30.0–36.0)
MCV: 83 fL (ref 78.0–100.0)
PLATELETS: 232 10*3/uL (ref 150–400)
RBC: 4.17 MIL/uL (ref 3.87–5.11)
RDW: 25.6 % — ABNORMAL HIGH (ref 11.5–15.5)
WBC: 11.8 10*3/uL — ABNORMAL HIGH (ref 4.0–10.5)

## 2013-12-03 LAB — BASIC METABOLIC PANEL
ANION GAP: 8 (ref 5–15)
BUN: 25 mg/dL — ABNORMAL HIGH (ref 6–23)
CO2: 38 mEq/L — ABNORMAL HIGH (ref 19–32)
Calcium: 8.6 mg/dL (ref 8.4–10.5)
Chloride: 102 mEq/L (ref 96–112)
Creatinine, Ser: 0.98 mg/dL (ref 0.50–1.10)
GFR calc Af Amer: 62 mL/min — ABNORMAL LOW (ref >90)
GFR calc non Af Amer: 53 mL/min — ABNORMAL LOW (ref >90)
Glucose, Bld: 99 mg/dL (ref 70–99)
Potassium: 3.4 mEq/L — ABNORMAL LOW (ref 3.7–5.3)
SODIUM: 148 meq/L — AB (ref 137–147)

## 2013-12-03 LAB — PROTIME-INR
INR: 1.25 (ref 0.00–1.49)
Prothrombin Time: 15.7 seconds — ABNORMAL HIGH (ref 11.6–15.2)

## 2013-12-03 LAB — APTT: APTT: 39 s — AB (ref 24–37)

## 2013-12-03 MED ORDER — MIDAZOLAM HCL 2 MG/2ML IJ SOLN
INTRAMUSCULAR | Status: AC
  Filled 2013-12-03: qty 2

## 2013-12-03 MED ORDER — FENTANYL CITRATE 0.05 MG/ML IJ SOLN
INTRAMUSCULAR | Status: AC | PRN
  Administered 2013-12-03: 25 ug via INTRAVENOUS

## 2013-12-03 MED ORDER — FUROSEMIDE 10 MG/ML IJ SOLN
40.0000 mg | Freq: Once | INTRAMUSCULAR | Status: AC
  Administered 2013-12-03: 40 mg via INTRAVENOUS

## 2013-12-03 MED ORDER — FENTANYL CITRATE 0.05 MG/ML IJ SOLN
INTRAMUSCULAR | Status: AC
  Filled 2013-12-03: qty 2

## 2013-12-03 MED ORDER — FUROSEMIDE 10 MG/ML IJ SOLN
INTRAMUSCULAR | Status: AC
Start: 2013-12-03 — End: 2013-12-03
  Filled 2013-12-03: qty 4

## 2013-12-03 MED ORDER — MIDAZOLAM HCL 2 MG/2ML IJ SOLN
INTRAMUSCULAR | Status: AC | PRN
  Administered 2013-12-03: 0.5 mg via INTRAVENOUS

## 2013-12-03 MED ORDER — LIDOCAINE HCL 1 % IJ SOLN
INTRAMUSCULAR | Status: AC
  Filled 2013-12-03: qty 10

## 2013-12-03 MED ORDER — POTASSIUM CHLORIDE CRYS ER 20 MEQ PO TBCR
40.0000 meq | EXTENDED_RELEASE_TABLET | Freq: Once | ORAL | Status: AC
  Administered 2013-12-03: 40 meq via ORAL
  Filled 2013-12-03: qty 2

## 2013-12-03 MED ORDER — MORPHINE SULFATE 2 MG/ML IJ SOLN
2.0000 mg | INTRAMUSCULAR | Status: AC
  Administered 2013-12-03: 2 mg via INTRAVENOUS
  Filled 2013-12-03: qty 1

## 2013-12-03 NOTE — Progress Notes (Signed)
eLink Physician-Brief Progress Note Patient Name: Felicia Diaz DOB: [birth year] MRN: [id number]   Date of Service  12/03/2013  HPI/Events of Note  Hypoxemic resp failure > sudden worsening Severe COPD, on high flow O2 Large effusion, s/p pleural drain Patient complaining of mild pain on side of drain  eICU Interventions  Lasix now cxr now Fentanyl now  DNR status noted per palliative conversation today     Intervention Category Major Interventions: Hypoxemia - evaluation and management;Respiratory failure - evaluation and management  Koleman Marling 12/03/2013, 8:17 PM

## 2013-12-03 NOTE — Sedation Documentation (Signed)
Chest tube in place by Dr Barbie Banner.

## 2013-12-03 NOTE — Progress Notes (Signed)
PULMONARY / CRITICAL CARE MEDICINE  Name: Felicia Diaz MRN: [id number] DOB: 04-Mar-1933    ADMISSION DATE:  11/03/2013  REFERRING MD :  EDP  CHIEF COMPLAINT:  Cardiac arrest  INITIAL PRESENTATION: 80 yo with CHF and COPD presented 8/27 with dyspnea and tarry stools. On arrival to ED became increasingly lethargic with agonal respirations, suffered PEA arrest, intubated, resuscitated.  STUDIES / SIGNIFICANT EVENTS: 8/27  Admitted with hemorrhagic shock secondary to UGIB. Suffered PEA arrest, intubated, resuscitated. GI consult. Cardiology consult 8/27  EGD >>> Gastric AVM ablated 8/27  TTE >>> EF 45%, grade 1 DD 8/30  Completely opacified left lung. Reintubated. 9/1    Bronchoscopy >>> purulent secretions RML and LLL aspirated; extubated 9/2    Hypoxemia / hypercarbia overnight, placed on BiPAP, awaiting HCPOA to discuss goals of care 9/2    Goals of care discussion with HCPOA, patient, Palliative Care >>> DNR/DNI 9/4    L chest drain by IR  INTERVAL HISTORY:   On HFNC overnight, now FiO2 50%  VITAL SIGNS: Temp:  [98.1 F (36.7 C)-98.6 F (37 C)] 98.1 F (36.7 C) (09/04 0750) Pulse Rate:  [59-85] 70 (09/04 0747) Resp:  [18-30] 26 (09/04 0747) BP: (103-167)/(38-70) 138/64 mmHg (09/04 0747) SpO2:  [86 %-99 %] 94 % (09/04 0747) FiO2 (%):  [90 %-100 %] 100 % (09/04 0747)  HEMODYNAMICS:   VENTILATOR SETTINGS: Vent Mode:  [-]  FiO2 (%):  [90 %-100 %] 100 %  INTAKE / OUTPUT: Intake/Output     09/03 0701 - 09/04 0700 09/04 0701 - 09/05 0700   P.O. 270    I.V. (mL/kg) 270 (3.8)    IV Piggyback 400    Total Intake(mL/kg) 940 (13.2)    Urine (mL/kg/hr) 2735 (1.6)    Stool     Total Output 2735     Net -1795           PHYSICAL EXAMINATION: General: Crest[place] in no distress Neuro: Sleeping, but wakes to voice HEENT: Dry membranes Cardiovascular:  Regular, no murmurs Lungs: Diminished air entry L Abdomen: Soft, non tender abdomen Ext: Trace edema   LABS: CBC  Recent  Labs Lab 11/30/13 0535 12/02/13 0500 12/03/13 0440  WBC 13.7* 12.6* 11.8*  HGB 9.4* 10.0* 10.0*  HCT 32.0* 34.5* 34.6*  PLT 181 209 232   Coag's No results found for this basename: APTT, INR,  in the last 168 hours  BMET  Recent Labs Lab 11/30/13 1700 12/02/13 0500 12/03/13 0440  NA 147 148* 148*  K 3.7 3.9 3.4*  CL 107 106 102  CO2 31 34* 38*  BUN 36* 25* 25*  CREATININE 1.01 0.95 0.98  GLUCOSE 127* 97 99   Electrolytes  Recent Labs Lab 11/30/13 0535 11/30/13 1700 12/02/13 0500 12/03/13 0440  CALCIUM 8.4 8.4 8.5 8.6  MG 2.5  --   --   --   PHOS 2.3  --   --   --    Sepsis Markers  Recent Labs Lab 11/26/13 0900 11/28/13 0837 11/29/13 0433 11/30/13 0535  LATICACIDVEN 0.9  --   --   --   PROCALCITON  --  2.52 1.63 0.79   ABG  Recent Labs Lab 11/29/13 0327 11/29/13 1143 12/01/13 0829  PHART 7.327* 7.409 7.278*  PCO2ART 64.2* 50.4* 69.2*  PO2ART 123.0* 94.0 51.0*   Liver Enzymes  Recent Labs Lab 11/28/13 0455 11/29/13 0433  AST 22 17  ALT 44* 33  ALKPHOS 60 64  BILITOT 0.7 0.6  ALBUMIN  2.3* 2.3*   Cardiac Enzymes  Recent Labs Lab 11/27/13 0445  TROPONINI 5.21*   Glucose  Recent Labs Lab 12/02/13 1259 12/02/13 1535 12/02/13 1950 12/03/13 0026 12/03/13 0400 12/03/13 0749  GLUCAP 96 131* 134* 93 107* 103*   IMAGING:  Dg Chest Port 1 View  12/03/2013   CLINICAL DATA:  Reassess lung fields  EXAM: PORTABLE CHEST - 1 VIEW  COMPARISON:  Portable chest x-ray of December 02, 2013  FINDINGS: There is persistent complete opacification of the left hemi thorax. On the right pleural fluid layers more inferiorly today likely the changes in the patient's positioning. The interstitial markings on the right remain increased. The cardiac silhouette is obscured. The permanent pacemaker is unchanged in appearance. The right internal jugular venous catheter tip lies in the region of the proximal SVC.  IMPRESSION: There has not been significant  interval change in the appearance of the chest since yesterday's study. Persistent complete opacification of the left hemithorax is noted and a moderate-sized pleural effusion is more apparent today likely due to patient positioning.   Electronically Signed   By: David  Martinique   On: 12/03/2013 07:26   Dg Chest Port 1 View  12/02/2013   CLINICAL DATA:  Evaluate for infiltrates  EXAM: PORTABLE CHEST - 1 VIEW  COMPARISON:  Portable chest x-ray of December 01, 2013  FINDINGS: There has been interval further loss of volume of the left lung with little if any aerated lung parenchyma remaining. The mediastinum is shifted toward the left due to mild hyperinflation of the right lung. The right lung exhibits diffusely increased interstitial markings with areas of near confluence which are stable. Pleural fluid likely layers posteriorly on the right. The cardiac silhouette is obscured. A permanent pacemaker is in place. The right internal jugular venous catheter tip lies in the region of the proximal SVC.  IMPRESSION: There has been further interval deterioration in the appearance of the left hemithorax due to accumulation of further pleural fluid, atelectasis, or a combination of both. On the right increased interstitial markings persist consistent with edema of cardiac or noncardiac cause.   Electronically Signed   By: David  Martinique   On: 12/02/2013 07:44   Dg Chest Port 1 View  12/01/2013   CLINICAL DATA:  Respiratory distress  EXAM: PORTABLE CHEST - 1 VIEW  COMPARISON:  Portable chest x-ray of November 30, 2013 and CT scan of the chest of the same date.  FINDINGS: The lungs are slightly less well inflated today. There is worsening of interstitial and now early alveolar infiltrate in the right upper lobe. Density in the right lung base is stable. The retrocardiac region remains dense with obscuration of the hemidiaphragm and portions of the heart border. The central pulmonary vascularity is prominent. The permanent  pacemaker is unchanged in position. The right internal jugular venous catheter tip lies in the midportion of the SVC peer  IMPRESSION: There there has been a interval deterioration in the appearance of the lungs since yesterday's study with increasing interstitial edema and likely more pleural fluid bilaterally. Underlying CHF is suspected.   Electronically Signed   By: David  Martinique   On: 12/01/2013 09:18   ASSESSMENT / PLAN:  PULMONARY A: COPD on nocturnal oxygen baseline, no evidence of exacerbation Acute hypoxemic / hypercarbic respiratory failure, re intubated 8/30, extubated 9/1, now DNI L lung atelectasis, recurrent, s/p repeated bronchoscopy on 9/2 Bilateral effusions on CT scan 9/2 Possible HCAP P:   Goal pH>7.30, SpO2>92 HFNC BiPAP  PRN Trend ABG/CXR Albuterol IR to place L pleural drain ( in hopes to assist L lung reexpansion, discussed with interventional radiologist )  CARDIOVASCULAR A: S/p PEA arrest due to hemorrhagic shock NSTEMI Acute on chronic systolic / diastolic congestive heart failure Complete heart block s/p pacemaker DNR P:  ASA / Heparin contraindicated - recent hemorrhagic shock ACEI contraindicated - AKI Coreg, Lipitor  RENAL A: Hypernatremia Hypokalemia  P:   Trend BMP Hold further diuresis K 40  GASTROINTESTINAL A: Gastric AVM s/p ablation 8/27 H/o colon AVM per daughter GI Px is not indicated Nutritin P:   NPO for procedure Advance diet in the afternoon  HEMATOLOGIC A: Acute blood loss anemia, no evidence of ongoing bleeding DVT Px P:  Trend CBC SCD  INFECTIOUS A: Possible HCAP ( NPOF on respiratory cx 8/30 ) MRSA PCR negative P:   Primaxin 8/30 >>> 9/6  ENDOCRINE A: Hyperglycemia P:   SSI  NEUROLOGIC A: Chest wall pain from CPR Deconditioning P:   OxyContin 10 bid Morphine PRN  Out of bed if able  I have personally obtained history, examined patient, evaluated and interpreted laboratory and imaging results,  reviewed medical records, formulated assessment / plan and placed orders.  Doree Fudge, MD Pulmonary and [place] Pager: [phone]  12/03/2013, 8:48 AM

## 2013-12-03 NOTE — H&P (Signed)
Felicia Diaz is an 80 y.o. female.   Chief Complaint: Pt admitted through ED 8/27 Dyspnea; tarry stools Suffered cardiac arrest- intubated- resuscitated Gastric AVM ablation in Endo SOB continues Now on High flow nasal cannula CXR reveals opacified left lung; atelectasis Hemothorax; recurrent effusion DNR/DNI PCCM Dr Earnest Conroy has requested IR consult for L chest tube placement Dr Barbie Banner has seen imaging and reviewed chart I have seen and examined pt Scheduled now for same  HPI: CHF; Pulm HTN; HTN; CAD; COPD; pacemaker  Past Medical History  Diagnosis Date  . CHF (congestive heart failure)     2D echo showed mild LVH and  hyperdynamic Left  ventricle with EF > 65%  . Pulmonary hypertension, mild   . Pericardial effusion     small pericardial effusion without evidence of amponade  . Hypertension   . Anemia     Admitted with severe anemia hemoglobin 5.8 colonoscopy pending  . Mitral valve regurgitation      mild mitral stenosis and mild mitral regurgitation no personal history of rheumatic fever but history of rheumatic fever and sister   . Subendocardial infarction     In the setting of severe anemia. Mild troponin leak  . Carotid artery disease     Bilateral 70% ICA stenosis  . COPD (chronic obstructive pulmonary disease)   . Sleep apnea   . CHB (complete heart block)     St Jude Pacemaker  . CAD (coronary artery disease)   . Atrioventricular block, complete   . Complication of anesthesia     cardiac arrest during colonoscopy per family  . GERD (gastroesophageal reflux disease)   . Pacemaker   . Cardiac arrest 11/03/2013    Past Surgical History  Procedure Laterality Date  . Abdominal hysterectomy    . Breast lumpectomy    . Givens capsule study  01/02/2011    Procedure: GIVENS CAPSULE STUDY;  Surgeon: Rogene Houston, MD;  Location: [facility];  Service: Endoscopy;  Laterality: N/A;  7:30 am  . Tonsillectomy and adenoidectomy    . Pacemaker placement  12-23-2011  .  Insert / replace / remove pacemaker    . Bladder tumor excision  05/2013    [facility]   [facility]  . Esophagogastroduodenoscopy N/A 11/03/2013    Procedure: ESOPHAGOGASTRODUODENOSCOPY (EGD);  Surgeon: Beryle Beams, MD;  Location: [facility] ENDOSCOPY;  Service: Endoscopy;  Laterality: N/A;    Family History  Problem Relation Age of Onset  . Colon cancer Father     died age 62  . Cancer Father   . Healthy Brother   . Cancer Brother   . Heart disease Sister   . Heart disease Mother    Social History:  reports that she quit smoking about 3 years ago. Her smoking use included Cigarettes. She has a 27.5 pack-year smoking history. She has never used smokeless tobacco. She reports that she does not drink alcohol or use illicit drugs.  Allergies:  Allergies  Allergen Reactions  . Latex Rash  . Penicillins Itching and Rash    Medications Prior to Admission  Medication Sig Dispense Refill  . amLODipine (NORVASC) 2.5 MG tablet Take 2.5 mg by mouth daily.       Marland Kitchen aspirin EC 81 MG tablet Take 81 mg by mouth daily.      . Calcium Carb-Cholecalciferol 600-800 MG-UNIT TABS Take 1 tablet by mouth daily.      Marland Kitchen dexlansoprazole (DEXILANT) 60 MG capsule Take 60 mg by mouth daily.      Marland Kitchen  ferrous sulfate 325 (65 FE) MG tablet Take 325 mg by mouth daily with breakfast.      . furosemide (LASIX) 20 MG tablet Take 40 mg by mouth daily.       . Multiple Vitamin (MULTIVITAMIN) tablet Take 1 tablet by mouth daily. Women's 50+      . PROAIR HFA 108 (90 BASE) MCG/ACT inhaler Inhale 2 puffs into the lungs 4 (four) times daily as needed for wheezing or shortness of breath.         Results for orders placed during the hospital encounter of 11/04/2013 (from the past 48 hour(s))  GLUCOSE, CAPILLARY     Status: Abnormal   Collection Time    12/01/13 11:44 AM      Result Value Ref Range   Glucose-Capillary 146 (*) 70 - 99 mg/dL  GLUCOSE, CAPILLARY     Status: Abnormal   Collection Time    12/01/13  3:35 PM       Result Value Ref Range   Glucose-Capillary 124 (*) 70 - 99 mg/dL  GLUCOSE, CAPILLARY     Status: Abnormal   Collection Time    12/01/13  7:04 PM      Result Value Ref Range   Glucose-Capillary 102 (*) 70 - 99 mg/dL  GLUCOSE, CAPILLARY     Status: Abnormal   Collection Time    12/01/13 11:48 PM      Result Value Ref Range   Glucose-Capillary 105 (*) 70 - 99 mg/dL   Comment 1 Documented in Chart     Comment 2 Notify RN    GLUCOSE, CAPILLARY     Status: None   Collection Time    12/02/13  4:23 AM      Result Value Ref Range   Glucose-Capillary 90  70 - 99 mg/dL  CBC     Status: Abnormal   Collection Time    12/02/13  5:00 AM      Result Value Ref Range   WBC 12.6 (*) 4.0 - 10.5 K/uL   RBC 4.14  3.87 - 5.11 MIL/uL   Hemoglobin 10.0 (*) 12.0 - 15.0 g/dL   HCT 34.5 (*) 36.0 - 46.0 %   MCV 83.3  78.0 - 100.0 fL   MCH 24.2 (*) 26.0 - 34.0 pg   MCHC 29.0 (*) 30.0 - 36.0 g/dL   RDW 25.4 (*) 11.5 - 15.5 %   Platelets 209  150 - 400 K/uL  BASIC METABOLIC PANEL     Status: Abnormal   Collection Time    12/02/13  5:00 AM      Result Value Ref Range   Sodium 148 (*) 137 - 147 mEq/L   Potassium 3.9  3.7 - 5.3 mEq/L   Chloride 106  96 - 112 mEq/L   CO2 34 (*) 19 - 32 mEq/L   Glucose, Bld 97  70 - 99 mg/dL   BUN 25 (*) 6 - 23 mg/dL   Creatinine, Ser 0.95  0.50 - 1.10 mg/dL   Calcium 8.5  8.4 - 10.5 mg/dL   GFR calc non Af Amer 55 (*) >90 mL/min   GFR calc Af Amer 64 (*) >90 mL/min   Comment: (NOTE)     The eGFR has been calculated using the CKD EPI equation.     This calculation has not been validated in all clinical situations.     eGFR's persistently <90 mL/min signify possible Chronic Kidney     Disease.   Anion gap 8  5 - 15  GLUCOSE, CAPILLARY     Status: None   Collection Time    12/02/13  7:58 AM      Result Value Ref Range   Glucose-Capillary 92  70 - 99 mg/dL  GLUCOSE, CAPILLARY     Status: None   Collection Time    12/02/13 12:59 PM      Result Value Ref Range    Glucose-Capillary 96  70 - 99 mg/dL  GLUCOSE, CAPILLARY     Status: Abnormal   Collection Time    12/02/13  3:35 PM      Result Value Ref Range   Glucose-Capillary 131 (*) 70 - 99 mg/dL  GLUCOSE, CAPILLARY     Status: Abnormal   Collection Time    12/02/13  7:50 PM      Result Value Ref Range   Glucose-Capillary 134 (*) 70 - 99 mg/dL   Comment 1 Notify RN    GLUCOSE, CAPILLARY     Status: None   Collection Time    12/03/13 12:26 AM      Result Value Ref Range   Glucose-Capillary 93  70 - 99 mg/dL  GLUCOSE, CAPILLARY     Status: Abnormal   Collection Time    12/03/13  4:00 AM      Result Value Ref Range   Glucose-Capillary 107 (*) 70 - 99 mg/dL  CBC     Status: Abnormal   Collection Time    12/03/13  4:40 AM      Result Value Ref Range   WBC 11.8 (*) 4.0 - 10.5 K/uL   RBC 4.17  3.87 - 5.11 MIL/uL   Hemoglobin 10.0 (*) 12.0 - 15.0 g/dL   HCT 34.6 (*) 36.0 - 46.0 %   MCV 83.0  78.0 - 100.0 fL   MCH 24.0 (*) 26.0 - 34.0 pg   MCHC 28.9 (*) 30.0 - 36.0 g/dL   RDW 25.6 (*) 11.5 - 15.5 %   Platelets 232  150 - 400 K/uL  BASIC METABOLIC PANEL     Status: Abnormal   Collection Time    12/03/13  4:40 AM      Result Value Ref Range   Sodium 148 (*) 137 - 147 mEq/L   Potassium 3.4 (*) 3.7 - 5.3 mEq/L   Chloride 102  96 - 112 mEq/L   CO2 38 (*) 19 - 32 mEq/L   Glucose, Bld 99  70 - 99 mg/dL   BUN 25 (*) 6 - 23 mg/dL   Creatinine, Ser 0.98  0.50 - 1.10 mg/dL   Calcium 8.6  8.4 - 10.5 mg/dL   GFR calc non Af Amer 53 (*) >90 mL/min   GFR calc Af Amer 62 (*) >90 mL/min   Comment: (NOTE)     The eGFR has been calculated using the CKD EPI equation.     This calculation has not been validated in all clinical situations.     eGFR's persistently <90 mL/min signify possible Chronic Kidney     Disease.   Anion gap 8  5 - 15  GLUCOSE, CAPILLARY     Status: Abnormal   Collection Time    12/03/13  7:49 AM      Result Value Ref Range   Glucose-Capillary 103 (*) 70 - 99 mg/dL   Dg  Chest Port 1 View  12/03/2013   CLINICAL DATA:  Reassess lung fields  EXAM: PORTABLE CHEST - 1 VIEW  COMPARISON:  Portable chest x-ray of  December 02, 2013  FINDINGS: There is persistent complete opacification of the left hemi thorax. On the right pleural fluid layers more inferiorly today likely the changes in the patient's positioning. The interstitial markings on the right remain increased. The cardiac silhouette is obscured. The permanent pacemaker is unchanged in appearance. The right internal jugular venous catheter tip lies in the region of the proximal SVC.  IMPRESSION: There has not been significant interval change in the appearance of the chest since yesterday's study. Persistent complete opacification of the left hemithorax is noted and a moderate-sized pleural effusion is more apparent today likely due to patient positioning.   Electronically Signed   By: David  Martinique   On: 12/03/2013 07:26   Dg Chest Port 1 View  12/02/2013   CLINICAL DATA:  Evaluate for infiltrates  EXAM: PORTABLE CHEST - 1 VIEW  COMPARISON:  Portable chest x-ray of December 01, 2013  FINDINGS: There has been interval further loss of volume of the left lung with little if any aerated lung parenchyma remaining. The mediastinum is shifted toward the left due to mild hyperinflation of the right lung. The right lung exhibits diffusely increased interstitial markings with areas of near confluence which are stable. Pleural fluid likely layers posteriorly on the right. The cardiac silhouette is obscured. A permanent pacemaker is in place. The right internal jugular venous catheter tip lies in the region of the proximal SVC.  IMPRESSION: There has been further interval deterioration in the appearance of the left hemithorax due to accumulation of further pleural fluid, atelectasis, or a combination of both. On the right increased interstitial markings persist consistent with edema of cardiac or noncardiac cause.   Electronically Signed   By:  David  Martinique   On: 12/02/2013 07:44    Review of Systems  Constitutional: Positive for diaphoresis. Negative for fever.  Respiratory: Positive for cough, sputum production, shortness of breath and wheezing.   Cardiovascular: Positive for chest pain.  Gastrointestinal: Negative for nausea and vomiting.  Neurological: Positive for weakness.  Psychiatric/Behavioral: Negative for substance abuse.    Blood pressure 161/53, pulse 71, temperature 98.1 F (36.7 C), temperature source Axillary, resp. rate 20, height $RemoveBe'4\' 10"'FWqIJohez$  (1.473 m), weight 71.1 kg (156 lb 12 oz), SpO2 87.00%. Physical Exam  Cardiovascular: Normal rate and regular rhythm.   No murmur heard. Respiratory: She is in respiratory distress. She has wheezes.  GI: Soft. Bowel sounds are normal. There is no tenderness.  Musculoskeletal: Normal range of motion.  Neurological: She is alert.  Pt alert Nods yes/no appropriate to all questions Seems aware of procedure  Skin: Skin is warm and dry.  Psychiatric:  Consented niece [name] over phone     Assessment/Plan Left lung opacification- atelectasis/hemothorax/effusion Scheduled for Left chest tube drain placement Pts family aware of procedure benefits and risks and agreeable to proceed Consented Niece Kitty via phone--in chart  [place] A 12/03/2013, 10:09 AM

## 2013-12-03 NOTE — Progress Notes (Signed)
Pt placed on NRB for transport. Will place back on HFNC when pt returns. Vitals are stable.

## 2013-12-03 NOTE — Progress Notes (Addendum)
Patient O2 saturations suddenly dropped from 92-96% at beginning of shift, to 80% at 2015pm. Elink notified after patient assessed and placed on NRB as well as her high flow [place]. RR mid 30's, up from mid 20's at beginning of shift. Patient complaining of left posterior chest pain from chest tube placement today. PRN pain medication given, MD order stat CXR abd 40mg  lasix. Will continue to monitor and assess patient.   Patient POA notified of rapid change in patient respiratory status.

## 2013-12-03 NOTE — Progress Notes (Signed)
Patient transferred to CT Scanner/ IR for line placement.  Report given to Ileene Rubens, RN.

## 2013-12-03 NOTE — Sedation Documentation (Signed)
Patient denies pain and is resting comfortably.  

## 2013-12-03 NOTE — Progress Notes (Signed)
eLink Physician-Brief Progress Note Patient Name: Felicia Diaz DOB: 03-13-1933 MRN: [id number]   Date of Service  12/03/2013  HPI/Events of Note  Hypokalemia  eICU Interventions  Potassium replaced     Intervention Category Minor Interventions: Electrolytes abnormality - evaluation and management  DETERDING,ELIZABETH 12/03/2013, 5:23 AM

## 2013-12-03 NOTE — Progress Notes (Signed)
NUTRITION FOLLOW UP  Intervention:    Diet advancement as tolerated, recommend regular diet.  May need PO supplement to help meet nutrition needs.  Nutrition Dx:   Inadequate oral intake related to inability to eat as evidenced by NPO status.  New Goal:   Intake to meet >90% of estimated nutrition needs.  Monitor:   Diet advancement, PO intake, labs, weight trend.  Assessment:   80 yo female with hx CHF, COPD, HTN presented 8/27 with c/o SOB, 2 day hx black tarry stools and was directed to ER by outpt MD. On arrival became increasingly lethargic, agonal resps, PEA arrest, intubated.Pt developed left lung collapse secondary to mucous plugging requiring intubation and bronchoscopy.  Patient was extubated on 9/1. Palliative Care Team is following; patient is now a DNR/DNI. Patient does not want a trach. On HFNC overnight. Plans for left chest drain placement by IR today.  Diet was advanced to clear liquids yesterday, but currently NPO for procedure today. Plans to advance diet after procedure today. Discussed patient in ICU rounds today.  Height: Ht Readings from Last 1 Encounters:  11/28/13 4\' 10"  (1.473 m)    Weight Status:   Wt Readings from Last 1 Encounters:  12/01/13 156 lb 12 oz (71.1 kg)  11/29/13  153 lb 3.5 oz (69.5 kg)   Re-estimated needs:  Kcal: 1300-1450 Protein: 70-80 gm Fluid: 1.5 L  Skin: WDL  Diet Order: NPO   Intake/Output Summary (Last 24 hours) at 12/03/13 0944 Last data filed at 12/03/13 0800  Gross per 24 hour  Intake    920 ml  Output   2610 ml  Net  -1690 ml    Last BM: 9/2  Labs:   Recent Labs Lab 11/29/13 0433 11/30/13 0535 11/30/13 1700 12/02/13 0500 12/03/13 0440  NA 150* 152* 147 148* 148*  K 3.8 3.7 3.7 3.9 3.4*  CL 108 111 107 106 102  CO2 30 31 31  34* 38*  BUN 37* 41* 36* 25* 25*  CREATININE 1.33* 1.07 1.01 0.95 0.98  CALCIUM 8.5 8.4 8.4 8.5 8.6  MG  --  2.5  --   --   --   PHOS  --  2.3  --   --   --   GLUCOSE  143* 111* 127* 97 99    CBG (last 3)   Recent Labs  12/03/13 0026 12/03/13 0400 12/03/13 0749  GLUCAP 93 107* 103*    Scheduled Meds: . albuterol  2.5 mg Nebulization Q6H  . carvedilol  3.125 mg Oral BID WC  . imipenem-cilastatin  250 mg Intravenous 4 times per day  . insulin aspart  0-15 Units Subcutaneous 6 times per day  . OxyCODONE  10 mg Oral Q12H    Continuous Infusions:     Molli Barrows, RD, LDN, [place] Pager [phone] After Hours Pager [phone]

## 2013-12-03 NOTE — Progress Notes (Signed)
Patient VV:[id number] Felicia Diaz      DOB: 1932-07-09      TGG:[id number]   Palliative Medicine Team at [facility] Progress Note    Subjective: Alert, feels like breathing is doing a bit better but still on high flow [place].  Had chest tube placed today by IR which she feels has helped her breathing. Has some discomfort around this site. Denies N/V.  Moved bowels yesterday.     Filed Vitals:   12/03/13 1538  BP:   Pulse:   Temp: 97.9 F (36.6 C)  Resp:    Physical exam: General: Alert, on High Flow [place]  HEENT: [place], sclera anicteric  Chest: CTAB anteriorly, left chest tube  CVS: RRR  Abdomen: soft, ND  Skin: warm/dry  CBC    Component Value Date/Time   WBC 11.8* 12/03/2013 0440   RBC 4.17 12/03/2013 0440   HGB 10.0* 12/03/2013 0440   HCT 34.6* 12/03/2013 0440   PLT 232 12/03/2013 0440   MCV 83.0 12/03/2013 0440   MCH 24.0* 12/03/2013 0440   MCHC 28.9* 12/03/2013 0440   RDW 25.6* 12/03/2013 0440   LYMPHSABS 4.6* 11/16/2013 1210   MONOABS 1.9* 11/18/2013 1210   EOSABS 0.4 11/16/2013 1210   BASOSABS 0.4* 11/17/2013 1210    CMP     Component Value Date/Time   NA 148* 12/03/2013 0440   K 3.4* 12/03/2013 0440   CL 102 12/03/2013 0440   CO2 38* 12/03/2013 0440   GLUCOSE 99 12/03/2013 0440   BUN 25* 12/03/2013 0440   CREATININE 0.98 12/03/2013 0440   CALCIUM 8.6 12/03/2013 0440   PROT 5.3* 11/29/2013 0433   ALBUMIN 2.3* 11/29/2013 0433   AST 17 11/29/2013 0433   ALT 33 11/29/2013 0433   ALKPHOS 64 11/29/2013 0433   BILITOT 0.6 11/29/2013 0433   GFRNONAA 53* 12/03/2013 0440   GFRAA 62* 12/03/2013 0440    9/4 CXR IMPRESSION:  There has not been significant interval change in the appearance of  the chest since yesterday's study. Persistent complete opacification  of the left hemithorax is noted and a moderate-sized pleural  effusion is more apparent today likely due to patient positioning.    Assessment and plan: 80 yo female with PMHx of HTN, PVD, CHB s/p pacemaker in 12/2011, COPD, AVM. She presented on 8/27  with presumed UGIB and resultant PEA arrest. Stay complicated by hypoxia/hypercarbia requiring repeat intubation.   1. Code Status:  DNR   2. Goals of Care:  See initial consult note. Chest tube placed today. Breathing doing a bit better.  Will see how she does over next hours to days. Came from a pretty good functional baseline prior to hospitalization.    3. Symptom Management:   Hypoxic Resp Failure, Dyspnea- chest tube, medical management. On high flow [place]. Has oxycontin and PRN morphine as well. Diuresis d/c'd with e-lyte abn.   Chest Pain-Related to CPR and chest tube. Has PRN morphine avaialable in addition to long acting oxycontin  4. Psychosocial/Spiritual: Family support mostly from nephew Felicia Diaz and Niece Felicia Diaz. Also has friend Felicia Diaz who visits often.   Doran Clay D.O. Palliative Medicine Team at [facility]  Pager: [phone] Team Phone: [phone]

## 2013-12-03 NOTE — Progress Notes (Signed)
Pt does not wish to go on Bipap at this time. PT stated she does not tolerate it well. She feels as though her breathing has been doing well with the HFNC. RT will continue to monitor.

## 2013-12-03 NOTE — Procedures (Signed)
L CT 20 Fr Clear fluid No comp

## 2013-12-04 ENCOUNTER — Inpatient Hospital Stay (HOSPITAL_COMMUNITY): Payer: Medicare Other

## 2013-12-04 LAB — CBC
HEMATOCRIT: 35.7 % — AB (ref 36.0–46.0)
HEMOGLOBIN: 10.3 g/dL — AB (ref 12.0–15.0)
MCH: 24.3 pg — ABNORMAL LOW (ref 26.0–34.0)
MCHC: 28.9 g/dL — ABNORMAL LOW (ref 30.0–36.0)
MCV: 84.2 fL (ref 78.0–100.0)
Platelets: 255 10*3/uL (ref 150–400)
RBC: 4.24 MIL/uL (ref 3.87–5.11)
RDW: 25.5 % — ABNORMAL HIGH (ref 11.5–15.5)
WBC: 15.2 10*3/uL — AB (ref 4.0–10.5)

## 2013-12-04 LAB — BASIC METABOLIC PANEL
Anion gap: 10 (ref 5–15)
BUN: 28 mg/dL — AB (ref 6–23)
CHLORIDE: 102 meq/L (ref 96–112)
CO2: 36 mEq/L — ABNORMAL HIGH (ref 19–32)
CREATININE: 1.04 mg/dL (ref 0.50–1.10)
Calcium: 8.7 mg/dL (ref 8.4–10.5)
GFR calc Af Amer: 58 mL/min — ABNORMAL LOW (ref >90)
GFR calc non Af Amer: 50 mL/min — ABNORMAL LOW (ref >90)
GLUCOSE: 109 mg/dL — AB (ref 70–99)
POTASSIUM: 4.4 meq/L (ref 3.7–5.3)
Sodium: 148 mEq/L — ABNORMAL HIGH (ref 137–147)

## 2013-12-04 LAB — GLUCOSE, CAPILLARY
GLUCOSE-CAPILLARY: 104 mg/dL — AB (ref 70–99)
GLUCOSE-CAPILLARY: 120 mg/dL — AB (ref 70–99)
GLUCOSE-CAPILLARY: 124 mg/dL — AB (ref 70–99)
Glucose-Capillary: 104 mg/dL — ABNORMAL HIGH (ref 70–99)
Glucose-Capillary: 107 mg/dL — ABNORMAL HIGH (ref 70–99)
Glucose-Capillary: 88 mg/dL (ref 70–99)

## 2013-12-04 MED ORDER — CHLORHEXIDINE GLUCONATE 0.12 % MT SOLN
15.0000 mL | Freq: Two times a day (BID) | OROMUCOSAL | Status: DC
  Administered 2013-12-04 – 2013-12-06 (×5): 15 mL via OROMUCOSAL
  Filled 2013-12-04 (×7): qty 15

## 2013-12-04 MED ORDER — CARVEDILOL 6.25 MG PO TABS
6.2500 mg | ORAL_TABLET | Freq: Two times a day (BID) | ORAL | Status: DC
  Administered 2013-12-04 – 2013-12-06 (×3): 6.25 mg via ORAL
  Filled 2013-12-04 (×7): qty 1

## 2013-12-04 MED ORDER — CETYLPYRIDINIUM CHLORIDE 0.05 % MT LIQD
7.0000 mL | Freq: Two times a day (BID) | OROMUCOSAL | Status: DC
  Administered 2013-12-04 – 2013-12-05 (×4): 7 mL via OROMUCOSAL

## 2013-12-04 NOTE — Progress Notes (Signed)
9 Days Post-Op  Subjective: Pt drowsy but arousable; states she is breathing a little better since left CT placed 9/4; still with sig oxygen requirements  Objective: Vital signs in last 24 hours: Temp:  [97.5 F (36.4 C)-98.9 F (37.2 C)] 98.7 F (37.1 C) (09/05 1144) Pulse Rate:  [64-83] 71 (09/05 1100) Resp:  [15-32] 29 (09/05 1100) BP: (99-155)/(4-89) 129/40 mmHg (09/05 1100) SpO2:  [83 %-99 %] 94 % (09/05 1100) FiO2 (%):  [90 %-100 %] 90 % (09/05 1100) Last BM Date: 12/01/13  Intake/Output from previous day: 09/04 0701 - 09/05 0700 In: 640 [I.V.:240; IV Piggyback:400] Out: 2325 [Urine:1125; Chest Tube:1200] Intake/Output this shift: Total I/O In: 590 [P.O.:360; I.V.:30; IV Piggyback:200] Out: 190 [Urine:100; Chest Tube:90]  Left CT intact, no air leak , attached to wall suction, output 90 cc's today, 1.2 liters 9/4 serosang fluid; pleural fluid cx's neg to date  Lab Results:   Recent Labs  12/03/13 0440 12/04/13 0543  WBC 11.8* 15.2*  HGB 10.0* 10.3*  HCT 34.6* 35.7*  PLT 232 255   BMET  Recent Labs  12/03/13 0440 12/04/13 0543  NA 148* 148*  K 3.4* 4.4  CL 102 102  CO2 38* 36*  GLUCOSE 99 109*  BUN 25* 28*  CREATININE 0.98 1.04  CALCIUM 8.6 8.7   PT/INR  Recent Labs  12/03/13 0904  LABPROT 15.7*  INR 1.25   ABG No results found for this basename: PHART, PCO2, PO2, HCO3,  in the last 72 hours  Studies/Results: Dg Chest Port 1 View  12/04/2013   CLINICAL DATA:  Lung collapse  EXAM: PORTABLE CHEST - 1 VIEW  COMPARISON:  Yesterday  FINDINGS: Left chest tube is stable. No pneumothorax. There is persistent shift of the mediastinum to the left compatible with left lower lobe collapse. Left upper lobe is aerated. Right pleural effusion stable. Right basilar opacity stable. Left subclavian pacemaker device unchanged. Extensive mitral annular calcifications.  IMPRESSION: Stable exam. Left chest tube is unchanged. Left lower lobe collapse persists. Right  pleural effusion.   Electronically Signed   By: Felicia Diaz M.D.   On: 12/04/2013 07:11   Dg Chest Port 1 View  12/03/2013   CLINICAL DATA:  Sudden hypoxemic respiratory failure. Increased shortness of breath. Left-sided chest pain.  EXAM: PORTABLE CHEST - 1 VIEW  COMPARISON:  12/03/2013  FINDINGS: Cardiac pacemaker. There appears to be a left chest tube in place. Right central venous catheter tip over the mid SVC. Rotated patient limits evaluation of location of appliances. Cardiac enlargement with mild pulmonary vascular congestion. Bilateral pleural effusions. No visible pneumothorax. No significant change since previous study, allowing for differences in technique.  IMPRESSION: Cardiac enlargement with pulmonary vascular congestion and bilateral pleural effusions. No significant change.   Electronically Signed   By: Felicia Diaz M.D.   On: 12/03/2013 21:27   Dg Chest Port 1 View  12/03/2013   CLINICAL DATA:  Reassess lung fields  EXAM: PORTABLE CHEST - 1 VIEW  COMPARISON:  Portable chest x-ray of December 02, 2013  FINDINGS: There is persistent complete opacification of the left hemi thorax. On the right pleural fluid layers more inferiorly today likely the changes in the patient's positioning. The interstitial markings on the right remain increased. The cardiac silhouette is obscured. The permanent pacemaker is unchanged in appearance. The right internal jugular venous catheter tip lies in the region of the proximal SVC.  IMPRESSION: There has not been significant interval change in the appearance of the  chest since yesterday's study. Persistent complete opacification of the left hemithorax is noted and a moderate-sized pleural effusion is more apparent today likely due to patient positioning.   Electronically Signed   By: Felicia  Diaz   On: 12/03/2013 07:26   Ct Image Guided Drainage By Percutaneous Catheter  12/03/2013   CLINICAL DATA:  Left pleural effusion  EXAM: CT IMAGE GUIDED DRAINAGE BY  PERCUTANEOUS CATHETER  FLUOROSCOPY TIME:  None  MEDICATIONS AND MEDICAL HISTORY: Versed 0.5 mg, Fentanyl 25 mcg.  Additional Medications: None.  ANESTHESIA/SEDATION: Moderate sedation time: 15 minutes  CONTRAST:  None  PROCEDURE: The procedure, risks, benefits, and alternatives were explained to the patient. Questions regarding the procedure were encouraged and answered. The patient understands and consents to the procedure.  The left posterior thorax in the right decubitus position was prepped with Betadine in a sterile fashion, and a sterile drape was applied covering the operative field. A sterile gown and sterile gloves were used for the procedure.  Under CT guidance, an 18 gauge needle was inserted into the left pleural space and removed over a J-wire. A 22 French dilator followed by a 20 [place] Thal quick drain were inserted advanced into the pleural fluid collection towards the left lung base. It was sewn in place. It was attached to a pleural vac at water seal.  FINDINGS: Images document 56 French Thal Quik left thoracostomy tube placement.  Mucous plugging in the left lower lobe airways and left lung collapse are noted.  COMPLICATIONS: None  IMPRESSION: Successful left 20 French thoracostomy. This will be attached to 20 cm water suction.  Consider repeat bronchoscopy if lung collapse continues.   Electronically Signed   By: Felicia Diaz M.D.   On: 12/03/2013 13:45    Anti-infectives: Anti-infectives   Start     Dose/Rate Route Frequency Ordered Stop   11/28/13 1200  imipenem-cilastatin (PRIMAXIN) 250 mg in sodium chloride 0.9 % 100 mL IVPB     250 mg 200 mL/hr over 30 Minutes Intravenous 4 times per day 11/28/13 0906 12/05/13 2359   11/28/13 1000  vancomycin (VANCOCIN) IVPB 1000 mg/200 mL premix  Status:  Discontinued     1,000 mg 200 mL/hr over 60 Minutes Intravenous Every 24 hours 11/28/13 0936 11/29/13 5462      Assessment/Plan: s/p left chest tube placement 9/4 for increasing effusion; CXR  today with no ptx,  persistent LLL collapse, right eff stable; case d/w Dr. Kathlene Diaz- hold on draining of rt effusion for now  LOS: 9 days    [name],[name] 12/04/2013

## 2013-12-04 NOTE — Progress Notes (Signed)
Agree.  D/W Dr. Earnest Conroy.  Will repeat CXR in AM to reassess right effusion.

## 2013-12-04 NOTE — Progress Notes (Addendum)
PULMONARY / CRITICAL CARE MEDICINE  Name: Felicia Diaz MRN: [id number] DOB: Aug 02, 1932    ADMISSION DATE:  11/23/2013  REFERRING MD :  EDP  CHIEF COMPLAINT:  Cardiac arrest  INITIAL PRESENTATION: 80 yo with CHF and COPD presented 8/27 with dyspnea and tarry stools. On arrival to ED became increasingly lethargic with agonal respirations, suffered PEA arrest, intubated, resuscitated.  STUDIES / SIGNIFICANT EVENTS: 8/27  Admitted with hemorrhagic shock secondary to UGIB. Suffered PEA arrest, intubated, resuscitated. GI consult. Cardiology consult 8/27  EGD >>> Gastric AVM ablated 8/27  TTE >>> EF 45%, grade 1 DD 8/30  Completely opacified left lung. Reintubated. 9/1    Bronchoscopy >>> purulent secretions RML and LLL aspirated; extubated 9/2    Hypoxemia / hypercarbia overnight, placed on BiPAP, awaiting HCPOA to discuss goals of care 9/2    Goals of care discussion with HCPOA, patient, Palliative Care >>> DNR/DNI 9/4    L chest drain by IR 9/5    Transferred to SDU 9/5    R chest drain by IR  INTERVAL HISTORY:  No acute events overnight.  Still high oxygen requirements.  L lung aeration improved after thoracentesis, however there is increasing effusion on the right.  VITAL SIGNS: Temp:  [97.5 F (36.4 C)-98.9 F (37.2 C)] 98.9 F (37.2 C) (09/05 0429) Pulse Rate:  [65-83] 71 (09/05 0500) Resp:  [19-32] 20 (09/05 0500) BP: (101-161)/(4-89) 121/47 mmHg (09/05 0500) SpO2:  [83 %-100 %] 98 % (09/05 0500) FiO2 (%):  [10 %-100 %] 100 % (09/05 0149)  HEMODYNAMICS:   VENTILATOR SETTINGS: Vent Mode:  [-]  FiO2 (%):  [10 %-100 %] 100 %  INTAKE / OUTPUT: Intake/Output     09/04 0701 - 09/05 0700 09/05 0701 - 09/06 0700   P.O.     I.V. (mL/kg) 230 (3.2)    IV Piggyback 400    Total Intake(mL/kg) 630 (8.9)    Urine (mL/kg/hr) 1125 (0.7)    Chest Tube 1200 (0.7)    Total Output 2325     Net -1695           PHYSICAL EXAMINATION: General: Comfortable Neuro: Awake,  alert HEENT: PERRL, moist membranes Cardiovascular:  Regular, no murmurs Lungs: Diminished air entry bilaterally, no added sounds Abdomen: Soft, non tender abdomen Ext: Trace edema   LABS: CBC  Recent Labs Lab 12/02/13 0500 12/03/13 0440 12/04/13 0543  WBC 12.6* 11.8* 15.2*  HGB 10.0* 10.0* 10.3*  HCT 34.5* 34.6* 35.7*  PLT 209 232 255   Coag's  Recent Labs Lab 12/03/13 0904  APTT 39*  INR 1.25    BMET  Recent Labs Lab 12/02/13 0500 12/03/13 0440 12/04/13 0543  NA 148* 148* 148*  K 3.9 3.4* 4.4  CL 106 102 102  CO2 34* 38* 36*  BUN 25* 25* 28*  CREATININE 0.95 0.98 1.04  GLUCOSE 97 99 109*   Electrolytes  Recent Labs Lab 11/30/13 0535  12/02/13 0500 12/03/13 0440 12/04/13 0543  CALCIUM 8.4  < > 8.5 8.6 8.7  MG 2.5  --   --   --   --   PHOS 2.3  --   --   --   --   < > = values in this interval not displayed. Sepsis Markers  Recent Labs Lab 11/28/13 0837 11/29/13 0433 11/30/13 0535  PROCALCITON 2.52 1.63 0.79   ABG  Recent Labs Lab 11/29/13 0327 11/29/13 1143 12/01/13 0829  PHART 7.327* 7.409 7.278*  PCO2ART 64.2* 50.4* 69.2*  PO2ART 123.0* 94.0 51.0*   Liver Enzymes  Recent Labs Lab 11/28/13 0455 11/29/13 0433  AST 22 17  ALT 44* 33  ALKPHOS 60 64  BILITOT 0.7 0.6  ALBUMIN 2.3* 2.3*   Cardiac Enzymes No results found for this basename: TROPONINI, PROBNP,  in the last 168 hours Glucose  Recent Labs Lab 12/03/13 0749 12/03/13 1408 12/03/13 1537 12/03/13 2110 12/03/13 2337 12/04/13 0430  GLUCAP 103* 94 104* 156* 120* 104*   IMAGING:  Dg Chest Port 1 View  12/03/2013   CLINICAL DATA:  Sudden hypoxemic respiratory failure. Increased shortness of breath. Left-sided chest pain.  EXAM: PORTABLE CHEST - 1 VIEW  COMPARISON:  12/03/2013  FINDINGS: Cardiac pacemaker. There appears to be a left chest tube in place. Right central venous catheter tip over the mid SVC. Rotated patient limits evaluation of location of appliances.  Cardiac enlargement with mild pulmonary vascular congestion. Bilateral pleural effusions. No visible pneumothorax. No significant change since previous study, allowing for differences in technique.  IMPRESSION: Cardiac enlargement with pulmonary vascular congestion and bilateral pleural effusions. No significant change.   Electronically Signed   By: Lucienne Capers M.D.   On: 12/03/2013 21:27   Dg Chest Port 1 View  12/03/2013   CLINICAL DATA:  Reassess lung fields  EXAM: PORTABLE CHEST - 1 VIEW  COMPARISON:  Portable chest x-ray of December 02, 2013  FINDINGS: There is persistent complete opacification of the left hemi thorax. On the right pleural fluid layers more inferiorly today likely the changes in the patient's positioning. The interstitial markings on the right remain increased. The cardiac silhouette is obscured. The permanent pacemaker is unchanged in appearance. The right internal jugular venous catheter tip lies in the region of the proximal SVC.  IMPRESSION: There has not been significant interval change in the appearance of the chest since yesterday's study. Persistent complete opacification of the left hemithorax is noted and a moderate-sized pleural effusion is more apparent today likely due to patient positioning.   Electronically Signed   By: David  Martinique   On: 12/03/2013 07:26   Ct Image Guided Drainage By Percutaneous Catheter  12/03/2013   CLINICAL DATA:  Left pleural effusion  EXAM: CT IMAGE GUIDED DRAINAGE BY PERCUTANEOUS CATHETER  FLUOROSCOPY TIME:  None  MEDICATIONS AND MEDICAL HISTORY: Versed 0.5 mg, Fentanyl 25 mcg.  Additional Medications: None.  ANESTHESIA/SEDATION: Moderate sedation time: 15 minutes  CONTRAST:  None  PROCEDURE: The procedure, risks, benefits, and alternatives were explained to the patient. Questions regarding the procedure were encouraged and answered. The patient understands and consents to the procedure.  The left posterior thorax in the right decubitus position  was prepped with Betadine in a sterile fashion, and a sterile drape was applied covering the operative field. A sterile gown and sterile gloves were used for the procedure.  Under CT guidance, an 18 gauge needle was inserted into the left pleural space and removed over a J-wire. A 22 French dilator followed by a 20 [place] Thal quick drain were inserted advanced into the pleural fluid collection towards the left lung base. It was sewn in place. It was attached to a pleural vac at water seal.  FINDINGS: Images document 28 French Thal Quik left thoracostomy tube placement.  Mucous plugging in the left lower lobe airways and left lung collapse are noted.  COMPLICATIONS: None  IMPRESSION: Successful left 20 French thoracostomy. This will be attached to 20 cm water suction.  Consider repeat bronchoscopy if lung collapse continues.  Electronically Signed   By: Maryclare Bean M.D.   On: 12/03/2013 13:45   ASSESSMENT / PLAN:  PULMONARY A: COPD on nocturnal oxygen baseline, no evidence of exacerbation Acute hypoxemic / hypercarbic respiratory failure, re intubated 8/30, extubated 9/1, now DNI on HFNC L lung atelectasis / effusion, recurrent, s/p repeated bronchoscopy on 9/2, improved after L chest tube placed by IR on 9/4 R pleural effusion, this is increasing in spite of diuresis Possible HCAP P:   Goal pH>7.30, SpO2>92 HFNC BiPAP PRN Trend ABG/CXR Albuterol Will request to place pleural drain on R - I discussed this with the patient, she is willing to attempt it.  CARDIOVASCULAR A: S/p PEA arrest due to hemorrhagic shock NSTEMI Acute on chronic systolic / diastolic congestive heart failure Complete heart block s/p pacemaker DNR P:  ASA / Heparin contraindicated - recent hemorrhagic shock ACEI contraindicated - AKI / borderline renal function Increase Coreg Lipitor  RENAL A: Hypernatremia P:   Trend BMP Further diuresis is limited by hypernatremia  GASTROINTESTINAL A: Gastric AVM s/p  ablation 8/27 H/o colon AVM per daughter GI Px is not indicated Nutritin P:   Regular diet  HEMATOLOGIC A: Acute blood loss anemia, no evidence of ongoing bleeding DVT Px P:  Trend CBC SCD  INFECTIOUS A: Possible HCAP ( NPOF on respiratory cx 8/30 ) MRSA PCR negative P:   Primaxin 8/30 >>> 9/6  ENDOCRINE A: Hyperglycemia P:   SSI  NEUROLOGIC A: Chest wall pain from CPR Deconditioning P:   OxyContin 10 bid Morphine PRN  Out of bed if able  Will transfer to SDU on PCCM service.  I have personally obtained history, examined patient, evaluated and interpreted laboratory and imaging results, reviewed medical records, formulated assessment / plan and placed orders.  Doree Fudge, MD Pulmonary and [place] Pager: [phone]  12/04/2013, 7:09 AM

## 2013-12-04 NOTE — Plan of Care (Signed)
Problem: ICU Phase Progression Outcomes Goal: Dyspnea controlled at rest Outcome: Not Met (add Reason) Continues to have exertion at rest.

## 2013-12-05 ENCOUNTER — Inpatient Hospital Stay (HOSPITAL_COMMUNITY): Payer: Medicare Other

## 2013-12-05 LAB — CBC
HCT: 34.9 % — ABNORMAL LOW (ref 36.0–46.0)
Hemoglobin: 10 g/dL — ABNORMAL LOW (ref 12.0–15.0)
MCH: 24.9 pg — AB (ref 26.0–34.0)
MCHC: 28.7 g/dL — ABNORMAL LOW (ref 30.0–36.0)
MCV: 86.8 fL (ref 78.0–100.0)
Platelets: 234 10*3/uL (ref 150–400)
RBC: 4.02 MIL/uL (ref 3.87–5.11)
RDW: 25 % — ABNORMAL HIGH (ref 11.5–15.5)
WBC: 14.4 10*3/uL — ABNORMAL HIGH (ref 4.0–10.5)

## 2013-12-05 LAB — BASIC METABOLIC PANEL
Anion gap: 7 (ref 5–15)
BUN: 31 mg/dL — ABNORMAL HIGH (ref 6–23)
CO2: 36 meq/L — AB (ref 19–32)
CREATININE: 0.94 mg/dL (ref 0.50–1.10)
Calcium: 8.7 mg/dL (ref 8.4–10.5)
Chloride: 101 mEq/L (ref 96–112)
GFR calc Af Amer: 65 mL/min — ABNORMAL LOW (ref >90)
GFR calc non Af Amer: 56 mL/min — ABNORMAL LOW (ref >90)
GLUCOSE: 216 mg/dL — AB (ref 70–99)
Potassium: 4.3 mEq/L (ref 3.7–5.3)
SODIUM: 144 meq/L (ref 137–147)

## 2013-12-05 LAB — GLUCOSE, CAPILLARY
GLUCOSE-CAPILLARY: 168 mg/dL — AB (ref 70–99)
GLUCOSE-CAPILLARY: 99 mg/dL (ref 70–99)
Glucose-Capillary: 91 mg/dL (ref 70–99)
Glucose-Capillary: 94 mg/dL (ref 70–99)
Glucose-Capillary: 96 mg/dL (ref 70–99)
Glucose-Capillary: 83 mg/dL (ref 70–99)

## 2013-12-05 NOTE — Progress Notes (Signed)
PULMONARY / CRITICAL CARE MEDICINE  Name: Felicia Diaz MRN: [id number] DOB: 1932/10/18    ADMISSION DATE:  10/30/2013  REFERRING MD :  EDP  CHIEF COMPLAINT:  Cardiac arrest  INITIAL PRESENTATION: 80 yo with CHF and COPD presented 8/27 with dyspnea and tarry stools. On arrival to ED became increasingly lethargic with agonal respirations, suffered PEA arrest, intubated, resuscitated.  STUDIES / SIGNIFICANT EVENTS: 8/27  Admitted with hemorrhagic shock secondary to UGIB. Suffered PEA arrest, intubated, resuscitated. GI consult. Cardiology consult 8/27  EGD >>> Gastric AVM ablated 8/27  TTE >>> EF 45%, grade 1 DD 8/30  Completely opacified left lung. Reintubated. 9/1    Bronchoscopy >>> purulent secretions RML and LLL aspirated; extubated 9/2    Hypoxemia / hypercarbia overnight, placed on BiPAP, awaiting HCPOA to discuss goals of care 9/2    Goals of care discussion with HCPOA, patient, Palliative Care >>> DNR/DNI 9/4    L chest drain by IR 9/5    Transferred to SDU 9/5    R chest drain by IR deferred to evaluate if more fluid accumulated by 9/6   INTERVAL HISTORY:  Doing well, oxygen requirements somewhat decreased  VITAL SIGNS: Temp:  [97.5 F (36.4 C)-98.8 F (37.1 C)] 97.6 F (36.4 C) (09/06 0731) Pulse Rate:  [60-76] 74 (09/06 0812) Resp:  [13-30] 23 (09/06 0700) BP: (85-129)/(30-51) 122/45 mmHg (09/06 0812) SpO2:  [81 %-99 %] 92 % (09/06 0734) FiO2 (%):  [90 %-100 %] 91.5 % (09/06 0734)  HEMODYNAMICS:   VENTILATOR SETTINGS: Vent Mode:  [-]  FiO2 (%):  [90 %-100 %] 91.5 %  INTAKE / OUTPUT: Intake/Output     09/05 0701 - 09/06 0700 09/06 0701 - 09/07 0700   P.O. 380    I.V. (mL/kg) 240 (3.4)    IV Piggyback 400    Total Intake(mL/kg) 1020 (14.3)    Urine (mL/kg/hr) 655 (0.4)    Chest Tube 530 (0.3)    Total Output 1185     Net -165           PHYSICAL EXAMINATION: General: Comfortable Neuro: Awake, alert HEENT: PERRL, moist membranes Cardiovascular:   Regular, no murmurs Lungs: Diminished anteriorly Abdomen: Soft, non tender abdomen Ext: Trace edema   LABS: CBC  Recent Labs Lab 12/02/13 0500 12/03/13 0440 12/04/13 0543  WBC 12.6* 11.8* 15.2*  HGB 10.0* 10.0* 10.3*  HCT 34.5* 34.6* 35.7*  PLT 209 232 255   Coag's  Recent Labs Lab 12/03/13 0904  APTT 39*  INR 1.25    BMET  Recent Labs Lab 12/02/13 0500 12/03/13 0440 12/04/13 0543  NA 148* 148* 148*  K 3.9 3.4* 4.4  CL 106 102 102  CO2 34* 38* 36*  BUN 25* 25* 28*  CREATININE 0.95 0.98 1.04  GLUCOSE 97 99 109*   Electrolytes  Recent Labs Lab 11/30/13 0535  12/02/13 0500 12/03/13 0440 12/04/13 0543  CALCIUM 8.4  < > 8.5 8.6 8.7  MG 2.5  --   --   --   --   PHOS 2.3  --   --   --   --   < > = values in this interval not displayed. Sepsis Markers  Recent Labs Lab 11/28/13 0837 11/29/13 0433 11/30/13 0535  PROCALCITON 2.52 1.63 0.79   ABG  Recent Labs Lab 11/29/13 0327 11/29/13 1143 12/01/13 0829  PHART 7.327* 7.409 7.278*  PCO2ART 64.2* 50.4* 69.2*  PO2ART 123.0* 94.0 51.0*   Liver Enzymes  Recent Labs Lab 11/29/13  0433  AST 17  ALT 33  ALKPHOS 64  BILITOT 0.6  ALBUMIN 2.3*   Cardiac Enzymes No results found for this basename: TROPONINI, PROBNP,  in the last 168 hours Glucose  Recent Labs Lab 12/04/13 1057 12/04/13 1507 12/04/13 1919 12/04/13 2340 12/05/13 0413 12/05/13 0704  GLUCAP 124* 107* 88 91 94 96   IMAGING:  Dg Chest Port 1 View  12/05/2013   CLINICAL DATA:  Evaluate pulmonary infiltrates  EXAM: PORTABLE CHEST - 1 VIEW  COMPARISON:  12/04/2013; 12/03/2013; 12/02/2013; 11/30/2013; chest CT - 11/30/2013  FINDINGS: Grossly unchanged enlarged cardiac silhouette and mediastinal contours given patient rotation to the left. Atherosclerotic plaque within the thoracic aorta. Exuberant calcifications within the mitral valve annulus. Stable position of support apparatus. No pneumothorax. The pulmonary vasculature remains  indistinct with cephalization of flow and unchanged small bilateral pleural effusions. Left mid and lower lung heterogeneous airspace opacities are unchanged. No new focal airspace opacities. Unchanged bones.  IMPRESSION:: IMPRESSION: 1.  Stable positioning of support apparatus.  No pneumothorax. 2. Grossly unchanged findings of pulmonary edema and small bilateral effusions. 3. Grossly unchanged right basilar heterogeneous opacities and extensive left mid and lower lung consolidative opacities and volume loss.   Electronically Signed   By: Sandi Mariscal M.D.   On: 12/05/2013 07:30   Dg Chest Port 1 View  12/04/2013   CLINICAL DATA:  Lung collapse  EXAM: PORTABLE CHEST - 1 VIEW  COMPARISON:  Yesterday  FINDINGS: Left chest tube is stable. No pneumothorax. There is persistent shift of the mediastinum to the left compatible with left lower lobe collapse. Left upper lobe is aerated. Right pleural effusion stable. Right basilar opacity stable. Left subclavian pacemaker device unchanged. Extensive mitral annular calcifications.  IMPRESSION: Stable exam. Left chest tube is unchanged. Left lower lobe collapse persists. Right pleural effusion.   Electronically Signed   By: Maryclare Bean M.D.   On: 12/04/2013 07:11   Dg Chest Port 1 View  12/03/2013   CLINICAL DATA:  Sudden hypoxemic respiratory failure. Increased shortness of breath. Left-sided chest pain.  EXAM: PORTABLE CHEST - 1 VIEW  COMPARISON:  12/03/2013  FINDINGS: Cardiac pacemaker. There appears to be a left chest tube in place. Right central venous catheter tip over the mid SVC. Rotated patient limits evaluation of location of appliances. Cardiac enlargement with mild pulmonary vascular congestion. Bilateral pleural effusions. No visible pneumothorax. No significant change since previous study, allowing for differences in technique.  IMPRESSION: Cardiac enlargement with pulmonary vascular congestion and bilateral pleural effusions. No significant change.    Electronically Signed   By: Lucienne Capers M.D.   On: 12/03/2013 21:27   Ct Image Guided Drainage By Percutaneous Catheter  12/03/2013   CLINICAL DATA:  Left pleural effusion  EXAM: CT IMAGE GUIDED DRAINAGE BY PERCUTANEOUS CATHETER  FLUOROSCOPY TIME:  None  MEDICATIONS AND MEDICAL HISTORY: Versed 0.5 mg, Fentanyl 25 mcg.  Additional Medications: None.  ANESTHESIA/SEDATION: Moderate sedation time: 15 minutes  CONTRAST:  None  PROCEDURE: The procedure, risks, benefits, and alternatives were explained to the patient. Questions regarding the procedure were encouraged and answered. The patient understands and consents to the procedure.  The left posterior thorax in the right decubitus position was prepped with Betadine in a sterile fashion, and a sterile drape was applied covering the operative field. A sterile gown and sterile gloves were used for the procedure.  Under CT guidance, an 18 gauge needle was inserted into the left pleural space and removed over a  J-wire. A 22 French dilator followed by a 20 [place] Thal quick drain were inserted advanced into the pleural fluid collection towards the left lung base. It was sewn in place. It was attached to a pleural vac at water seal.  FINDINGS: Images document 63 French Thal Quik left thoracostomy tube placement.  Mucous plugging in the left lower lobe airways and left lung collapse are noted.  COMPLICATIONS: None  IMPRESSION: Successful left 20 French thoracostomy. This will be attached to 20 cm water suction.  Consider repeat bronchoscopy if lung collapse continues.   Electronically Signed   By: Maryclare Bean M.D.   On: 12/03/2013 13:45   ASSESSMENT / PLAN:  PULMONARY A: COPD on nocturnal oxygen baseline, no evidence of exacerbation Acute hypoxemic / hypercarbic respiratory failure, re intubated 8/30, extubated 9/1, now DNI on HFNC L lung atelectasis / effusion, recurrent, s/p repeated bronchoscopy on 9/2, improved after L chest tube placed by IR on 9/4 R pleural  effusion, drain was discussed with IR on 9/5 - they want to defer and see if more fluid accumulates Possible HCAP P:   Goal pH>7.30, SpO2>92 HFNC BiPAP PRN Trend ABG/CXR Albuterol May need pleural R drain   CARDIOVASCULAR A: S/p PEA arrest due to hemorrhagic shock NSTEMI Acute on chronic systolic / diastolic congestive heart failure Complete heart block s/p pacemaker DNR P:  ASA / Heparin contraindicated - recent hemorrhagic shock ACEI contraindicated - AKI / borderline renal function Coreg Lipitor  RENAL A: Hypernatremia P:   Trend BMP Further diuresis is limited by hypernatremia  GASTROINTESTINAL A: Gastric AVM s/p ablation 8/27 H/o colon AVM per daughter GI Px is not indicated Nutrition P:   Regular diet  HEMATOLOGIC A: Acute blood loss anemia, no evidence of ongoing bleeding DVT Px P:  Trend CBC SCD  INFECTIOUS A: Possible HCAP ( NPOF on respiratory cx 8/30 ) MRSA PCR negative P:   Primaxin 8/30 >>> 9/6  ENDOCRINE A: Hyperglycemia P:   SSI  NEUROLOGIC A: Chest wall pain from CPR Deconditioning P:   OxyContin 10 bid Morphine PRN  Out of bed if able  I have personally obtained history, examined patient, evaluated and interpreted laboratory and imaging results, reviewed medical records, formulated assessment / plan and placed orders.  Doree Fudge, MD Pulmonary and [place] Pager: [phone]  12/05/2013, 8:19 AM

## 2013-12-05 NOTE — Progress Notes (Signed)
1900 Pt transferred to [facility]. Pt placed on cardiac monitor, no signs of discomfort, VSS. Pt with no complaints of pain. Bedside report given to [facility], Therapist, sports. All questions answered. Family informed of patient move.

## 2013-12-05 NOTE — Progress Notes (Signed)
10 Days Post-Op  Subjective: Pt states she is feeling/breathing better, more alert today  Objective: Vital signs in last 24 hours: Temp:  [97.5 F (36.4 C)-98.8 F (37.1 C)] 97.6 F (36.4 C) (09/06 0731) Pulse Rate:  [47-76] 47 (09/06 0900) Resp:  [13-30] 21 (09/06 0900) BP: (85-129)/(30-51) 85/34 mmHg (09/06 0900) SpO2:  [81 %-98 %] 98 % (09/06 0900) FiO2 (%):  [90 %-95 %] 91.5 % (09/06 0734) Last BM Date: 12/01/13  Intake/Output from previous day: 09/05 0701 - 09/06 0700 In: 1020 [P.O.:380; I.V.:240; IV Piggyback:400] Out: 1185 [Urine:655; Chest Tube:530] Intake/Output this shift: Total I/O In: 20 [I.V.:20] Out: -   Left CT intact, output 530 cc's blood-tinged fluid, no air leak; CXR as below  Lab Results:   Recent Labs  12/03/13 0440 12/04/13 0543  WBC 11.8* 15.2*  HGB 10.0* 10.3*  HCT 34.6* 35.7*  PLT 232 255   BMET  Recent Labs  12/03/13 0440 12/04/13 0543  NA 148* 148*  K 3.4* 4.4  CL 102 102  CO2 38* 36*  GLUCOSE 99 109*  BUN 25* 28*  CREATININE 0.98 1.04  CALCIUM 8.6 8.7   PT/INR  Recent Labs  12/03/13 0904  LABPROT 15.7*  INR 1.25   ABG No results found for this basename: PHART, PCO2, PO2, HCO3,  in the last 72 hours  Studies/Results: Dg Chest Port 1 View  12/05/2013   CLINICAL DATA:  Evaluate pulmonary infiltrates  EXAM: PORTABLE CHEST - 1 VIEW  COMPARISON:  12/04/2013; 12/03/2013; 12/02/2013; 11/30/2013; chest CT - 11/30/2013  FINDINGS: Grossly unchanged enlarged cardiac silhouette and mediastinal contours given patient rotation to the left. Atherosclerotic plaque within the thoracic aorta. Exuberant calcifications within the mitral valve annulus. Stable position of support apparatus. No pneumothorax. The pulmonary vasculature remains indistinct with cephalization of flow and unchanged small bilateral pleural effusions. Left mid and lower lung heterogeneous airspace opacities are unchanged. No new focal airspace opacities. Unchanged bones.   IMPRESSION:: IMPRESSION: 1.  Stable positioning of support apparatus.  No pneumothorax. 2. Grossly unchanged findings of pulmonary edema and small bilateral effusions. 3. Grossly unchanged right basilar heterogeneous opacities and extensive left mid and lower lung consolidative opacities and volume loss.   Electronically Signed   By: Sandi Mariscal M.D.   On: 12/05/2013 07:30   Dg Chest Port 1 View  12/04/2013   CLINICAL DATA:  Lung collapse  EXAM: PORTABLE CHEST - 1 VIEW  COMPARISON:  Yesterday  FINDINGS: Left chest tube is stable. No pneumothorax. There is persistent shift of the mediastinum to the left compatible with left lower lobe collapse. Left upper lobe is aerated. Right pleural effusion stable. Right basilar opacity stable. Left subclavian pacemaker device unchanged. Extensive mitral annular calcifications.  IMPRESSION: Stable exam. Left chest tube is unchanged. Left lower lobe collapse persists. Right pleural effusion.   Electronically Signed   By: Maryclare Bean M.D.   On: 12/04/2013 07:11   Dg Chest Port 1 View  12/03/2013   CLINICAL DATA:  Sudden hypoxemic respiratory failure. Increased shortness of breath. Left-sided chest pain.  EXAM: PORTABLE CHEST - 1 VIEW  COMPARISON:  12/03/2013  FINDINGS: Cardiac pacemaker. There appears to be a left chest tube in place. Right central venous catheter tip over the mid SVC. Rotated patient limits evaluation of location of appliances. Cardiac enlargement with mild pulmonary vascular congestion. Bilateral pleural effusions. No visible pneumothorax. No significant change since previous study, allowing for differences in technique.  IMPRESSION: Cardiac enlargement with pulmonary vascular congestion  and bilateral pleural effusions. No significant change.   Electronically Signed   By: Lucienne Capers M.D.   On: 12/03/2013 21:27   Ct Image Guided Drainage By Percutaneous Catheter  12/03/2013   CLINICAL DATA:  Left pleural effusion  EXAM: CT IMAGE GUIDED DRAINAGE BY  PERCUTANEOUS CATHETER  FLUOROSCOPY TIME:  None  MEDICATIONS AND MEDICAL HISTORY: Versed 0.5 mg, Fentanyl 25 mcg.  Additional Medications: None.  ANESTHESIA/SEDATION: Moderate sedation time: 15 minutes  CONTRAST:  None  PROCEDURE: The procedure, risks, benefits, and alternatives were explained to the patient. Questions regarding the procedure were encouraged and answered. The patient understands and consents to the procedure.  The left posterior thorax in the right decubitus position was prepped with Betadine in a sterile fashion, and a sterile drape was applied covering the operative field. A sterile gown and sterile gloves were used for the procedure.  Under CT guidance, an 18 gauge needle was inserted into the left pleural space and removed over a J-wire. A 22 French dilator followed by a 20 [place] Thal quick drain were inserted advanced into the pleural fluid collection towards the left lung base. It was sewn in place. It was attached to a pleural vac at water seal.  FINDINGS: Images document 66 French Thal Quik left thoracostomy tube placement.  Mucous plugging in the left lower lobe airways and left lung collapse are noted.  COMPLICATIONS: None  IMPRESSION: Successful left 20 French thoracostomy. This will be attached to 20 cm water suction.  Consider repeat bronchoscopy if lung collapse continues.   Electronically Signed   By: Maryclare Bean M.D.   On: 12/03/2013 13:45    Anti-infectives: Anti-infectives   Start     Dose/Rate Route Frequency Ordered Stop   11/28/13 1200  imipenem-cilastatin (PRIMAXIN) 250 mg in sodium chloride 0.9 % 100 mL IVPB     250 mg 200 mL/hr over 30 Minutes Intravenous 4 times per day 11/28/13 0906 12/05/13 2359   11/28/13 1000  vancomycin (VANCOCIN) IVPB 1000 mg/200 mL premix  Status:  Discontinued     1,000 mg 200 mL/hr over 60 Minutes Intravenous Every 24 hours 11/28/13 0936 11/29/13 7681      Assessment/Plan: s/p left chest tube placement 9/4 for increasing effusion; CXR  stable today; cont current tx    LOS: 10 days    Bohdi Leeds,D [facility] 12/05/2013

## 2013-12-05 NOTE — Progress Notes (Signed)
Pt. Transported uneventfully from 2M03 to 2C16 with NRB @ 15L 100% Fi02 in place, placed back on HFNC upon arrival @ 50 LPM/95% Fi02, tolerating well, RT covering unit made aware to monitor.

## 2013-12-05 NOTE — Progress Notes (Signed)
Pt arrived on unit at approximately 1900, pt is alert

## 2013-12-06 ENCOUNTER — Inpatient Hospital Stay (HOSPITAL_COMMUNITY): Payer: Medicare Other

## 2013-12-06 DIAGNOSIS — J90 Pleural effusion, not elsewhere classified: Secondary | ICD-10-CM

## 2013-12-06 LAB — CBC
HCT: 37.2 % (ref 36.0–46.0)
HEMOGLOBIN: 10.3 g/dL — AB (ref 12.0–15.0)
MCH: 24.3 pg — ABNORMAL LOW (ref 26.0–34.0)
MCHC: 27.7 g/dL — ABNORMAL LOW (ref 30.0–36.0)
MCV: 87.9 fL (ref 78.0–100.0)
Platelets: 286 10*3/uL (ref 150–400)
RBC: 4.23 MIL/uL (ref 3.87–5.11)
RDW: 25.1 % — ABNORMAL HIGH (ref 11.5–15.5)
WBC: 17.7 10*3/uL — ABNORMAL HIGH (ref 4.0–10.5)

## 2013-12-06 LAB — GLUCOSE, CAPILLARY
GLUCOSE-CAPILLARY: 105 mg/dL — AB (ref 70–99)
GLUCOSE-CAPILLARY: 90 mg/dL (ref 70–99)
GLUCOSE-CAPILLARY: 93 mg/dL (ref 70–99)
Glucose-Capillary: 111 mg/dL — ABNORMAL HIGH (ref 70–99)
Glucose-Capillary: 106 mg/dL — ABNORMAL HIGH (ref 70–99)

## 2013-12-06 LAB — BASIC METABOLIC PANEL
Anion gap: 9 (ref 5–15)
BUN: 34 mg/dL — ABNORMAL HIGH (ref 6–23)
CALCIUM: 8.7 mg/dL (ref 8.4–10.5)
CHLORIDE: 101 meq/L (ref 96–112)
CO2: 35 meq/L — AB (ref 19–32)
Creatinine, Ser: 1.07 mg/dL (ref 0.50–1.10)
GFR calc Af Amer: 56 mL/min — ABNORMAL LOW (ref >90)
GFR calc non Af Amer: 48 mL/min — ABNORMAL LOW (ref >90)
GLUCOSE: 90 mg/dL (ref 70–99)
Potassium: 5 mEq/L (ref 3.7–5.3)
SODIUM: 145 meq/L (ref 137–147)

## 2013-12-06 MED ORDER — MORPHINE SULFATE 10 MG/ML IJ SOLN
10.0000 mg/h | INTRAVENOUS | Status: DC
  Administered 2013-12-06: 10 mg/h via INTRAVENOUS
  Filled 2013-12-06: qty 10

## 2013-12-06 MED ORDER — SODIUM CHLORIDE 0.9 % IV BOLUS (SEPSIS)
250.0000 mL | Freq: Once | INTRAVENOUS | Status: AC
  Administered 2013-12-06: 250 mL via INTRAVENOUS

## 2013-12-06 MED ORDER — MORPHINE BOLUS VIA INFUSION
5.0000 mg | INTRAVENOUS | Status: DC | PRN
  Filled 2013-12-06: qty 20

## 2013-12-07 LAB — CULTURE, ROUTINE-ABSCESS
Culture: NO GROWTH
GRAM STAIN: NONE SEEN

## 2013-12-16 ENCOUNTER — Encounter: Payer: Medicare Other | Admitting: Internal Medicine

## 2013-12-28 NOTE — Discharge Summary (Signed)
Dictation #:  [id number]

## 2013-12-29 NOTE — Discharge Summary (Signed)
NAMEJACKILYN, UMPHLETT NO.:  [id number]  MEDICAL RECORD NO.:  [id number]  LOCATION:  2C16C                        FACILITY:  [facility]  PHYSICIAN:  Kathee Delton, MD,FCCPDATE OF BIRTH:  06/[birth year]1934  DATE OF ADMISSION:  11/12/2013 DATE OF DISCHARGE:  2014-01-04                              DISCHARGE SUMMARY   DEATH SUMMARY  DIAGNOSES AT TIME OF DEATH: 1. Acute on chronic hypoxemic and hypercarbic respiratory failure. 2. Status post PEA arrest due to hemorrhagic shock with non-ST-     elevation myocardial infarction. 3. Acute on chronic systolic and diastolic heart failure. 4. Complete heart block status post pacemaker. 5. Pleural effusion.  HISTORY OF PRESENT ILLNESS:  Please see dictated H and P at the time of admission.  HOSPITAL COURSE:  The patient was admitted to the hospital with PEA arrest where she was intubated and resuscitated aggressively.  Her complicated hospital course is well documented in the medical record. Despite aggressive intervention for all of her problems, she continued to do poorly.  The decision was made to switch into comfort care node, and palliative care consultation was called.  The patient was kept comfortable and ultimately expired on 2014-01-04.     Kathee Delton, MD,FCCP     [facility]/MEDQ  D:  12/28/2013  T:  12/29/2013  Job:  [id number]

## 2013-12-30 NOTE — Progress Notes (Signed)
eLink Physician-Brief Progress Note Patient Name: Felicia Diaz DOB: 1932/07/25 MRN: [id number]   Date of Service  December 15, 2013  HPI/Events of Note  Appreciate Dr Angelia Mould assistance. The family and pt are ready to transition off BiPAP and concentrate on comfort .  I will start morphine gtt, plan to transition from BiPAP to [place] o2 when her comfort has been assured.   eICU Interventions       Intervention Category Major Interventions: Respiratory failure - evaluation and management  Felicia Hobbins S. 2013-12-15, 7:20 PM

## 2013-12-30 NOTE — Progress Notes (Signed)
Pt's BPs running softer this afternoon mid-upper 70's, than this am's 80-90's.  Pt has been on BIPAP since noon for low sats, and BP running in 70's.  Pt has been sleeping and no distress noted, attempted to arouse pt at this time, aroused by light sternal rub only.  Call placed to Dr Lamonte Sakai at [facility], discussed the above, aware of pt being DNR, and palliative following pt also. Will plan for a gentle bolus of fluids.

## 2013-12-30 NOTE — Progress Notes (Signed)
Assessment made of heart and lungs sounds per protocol, no sounds auscultated, no pulse palpated. Pronounced death at 07-28-43 by Reatha Armour RN and Mitzi Davenport RN per Dr. Baltazar Apo.

## 2013-12-30 NOTE — Progress Notes (Signed)
Called by RN to pt room for low sat in the 60s. Pt had 1 nasal prong out of nose that RN had placed back in prior to calling. Upon arrival pt sat was still in the 70s. Pt was placed on 100% NRB and sat came up to mid 63s. Pt had small shallow breaths and belly breathing. Pt was then placed on BiPAP. Pt sat came up to 92%. Pt is tolerating at this time.

## 2013-12-30 NOTE — Progress Notes (Addendum)
Patient [name]      DOB: 07/31/1932      AYT:[id number]  Spoke with nephew and Geralynn Ochs over phone. Made him aware of situation. We talked about decline and limited success with BiPAP.  Talked about prognosis possibly being in hours to days at this point unless things somehow turn around with BiPAP which I told him I was not expecting. He will touch base with family and come in after work today. Would be reasonable to pursue comfort care and start morphine gtt at $Remo'2mg'DrFdO$ /hr with 2-$RemoveBe'4mg'bpxbwxrnq$  q19min PRN dose.  Ativan PRN etc.  Will see if family can come in and I will follow-up tomorrow if she survives the night and family does not elect to pursue comfort care before then.   Doran Clay D.O. Palliative Medicine Team at [facility]  Pager: [phone] Team Phone: [phone]  ADDENDUM: Met with Felicia Diaz and her sister.  Discussed ongoing issues and prognosis. I do not think any escelation of care is indicated or warranted. She is full DNR. Additional fluids likely to make respiratory status worse. We did discuss allowing total switch to comfort with removal of BiPAP when all family has had a chance to see her (many coming in from several different areas).  Family agrees with concept of not escelating care and aware she could pass away in next hours with ongoing level of support. Told them I suspected she would pass away in minutes to hours if we switched BiPAP to nasal cannula.    Doran Clay D.O. Palliative Medicine Team at [facility]  Pager: [phone] Team Phone: [phone]

## 2013-12-30 NOTE — Progress Notes (Signed)
PULMONARY / CRITICAL CARE MEDICINE  Name: ANGLE DIRUSSO MRN: [id number] DOB: [birth year]    ADMISSION DATE:  11/01/2013  REFERRING MD :  EDP  CHIEF COMPLAINT:  Cardiac arrest  INITIAL PRESENTATION: 80 yo with CHF and COPD presented 8/27 with dyspnea and tarry stools. On arrival to ED became increasingly lethargic with agonal respirations, suffered PEA arrest, intubated, resuscitated.  STUDIES / SIGNIFICANT EVENTS: 8/27  Admitted with hemorrhagic shock secondary to UGIB. Suffered PEA arrest, intubated, resuscitated. GI consult. Cardiology consult 8/27  EGD >>> Gastric AVM ablated 8/27  TTE >>> EF 45%, grade 1 DD 8/30  Completely opacified left lung. Reintubated. 9/1    Bronchoscopy >>> purulent secretions RML and LLL aspirated; extubated 9/2    Hypoxemia / hypercarbia overnight, placed on BiPAP, awaiting HCPOA to discuss goals of care 9/2    Goals of care discussion with HCPOA, patient, Palliative Care >>> DNR/DNI 9/4    L chest drain by IR 9/5    Transferred to SDU 9/5    R chest drain by IR deferred to evaluate if more fluid accumulated by 9/6   INTERVAL HISTORY:  Alternating btw high flow oxygen system and NIPPV.  Currently with very little increased wob.  L CT still draining.  VITAL SIGNS: Temp:  [98 F (36.7 C)-98.7 F (37.1 C)] 98.4 F (36.9 C) (09/07 1158) Pulse Rate:  [61-92] 86 (09/07 1200) Resp:  [14-32] 20 (09/07 1200) BP: (81-115)/(32-89) 89/38 mmHg (09/07 1200) SpO2:  [71 %-100 %] 92 % (09/07 1200) FiO2 (%):  [95 %-100 %] 100 % (09/07 1145) Weight:  [64.2 kg (141 lb 8.6 oz)-66.8 kg (147 lb 4.3 oz)] 64.2 kg (141 lb 8.6 oz) (09/07 0404)  HEMODYNAMICS:   VENTILATOR SETTINGS: Vent Mode:  [-]  FiO2 (%):  [95 %-100 %] 100 %  INTAKE / OUTPUT: Intake/Output     09/06 0701 - 09/07 0700 09/07 0701 - 09/08 0700   P.O.     I.V. (mL/kg) 110 (1.7)    IV Piggyback 200    Total Intake(mL/kg) 310 (4.8)    Urine (mL/kg/hr) 900 (0.6)    Chest Tube 480 (0.3)    Total  Output 1380     Net -1070           PHYSICAL EXAMINATION: General: wd female with mild increased wob Neuro: Awake, alert, moves all 4 HEENT: nose without purulence, neck without LN or TMG Cardiovascular:  Mild tachy, regular, no murmurs Lungs: bibasilar crackles, diffuse rhonchi.  Left CT in place Abdomen: Soft, non tender BS+ Ext: mild edema, no cyanosis   LABS: CBC  Recent Labs Lab 12/04/13 0543 12/05/13 0822 01-03-14 0630  WBC 15.2* 14.4* 17.7*  HGB 10.3* 10.0* 10.3*  HCT 35.7* 34.9* 37.2  PLT 255 234 286     BMET  Recent Labs Lab 12/04/13 0543 12/05/13 0822 01/03/2014 0630  NA 148* 144 145  K 4.4 4.3 5.0  CL 102 101 101  CO2 36* 36* 35*  BUN 28* 31* 34*  CREATININE 1.04 0.94 1.07  GLUCOSE 109* 216* 90     IMAGING:  Dg Chest Port 1 View  Jan 03, 2014   CLINICAL DATA:  Followup congestive heart failure and pleural effusion.  EXAM: PORTABLE CHEST - 1 VIEW  COMPARISON:  12/05/2013  FINDINGS: Left-sided chest tube remains in place, however there is a persistent large pleural effusion with small pneumothorax component seen in the apex. There is persistent left lung volume loss.  Diffuse right lung interstitial infiltrates again  seen, suspicious for pulmonary edema. Small right pleural effusion is also stable. Dual lead transvenous pacemaker and right internal jugular central venous catheter remain in appropriate position.  IMPRESSION: No significant change in left lung volume loss and large left hydro pneumothorax, with left chest tube remaining in place.  No significant change in probable diffuse right lung interstitial edema and small right pleural effusion.   Electronically Signed   By: Earle Gell M.D.   On: 12-15-2013 09:13   Dg Chest Port 1 View  12/05/2013   CLINICAL DATA:  Evaluate pulmonary infiltrates  EXAM: PORTABLE CHEST - 1 VIEW  COMPARISON:  12/04/2013; 12/03/2013; 12/02/2013; 11/30/2013; chest CT - 11/30/2013  FINDINGS: Grossly unchanged enlarged cardiac  silhouette and mediastinal contours given patient rotation to the left. Atherosclerotic plaque within the thoracic aorta. Exuberant calcifications within the mitral valve annulus. Stable position of support apparatus. No pneumothorax. The pulmonary vasculature remains indistinct with cephalization of flow and unchanged small bilateral pleural effusions. Left mid and lower lung heterogeneous airspace opacities are unchanged. No new focal airspace opacities. Unchanged bones.  IMPRESSION:: IMPRESSION: 1.  Stable positioning of support apparatus.  No pneumothorax. 2. Grossly unchanged findings of pulmonary edema and small bilateral effusions. 3. Grossly unchanged right basilar heterogeneous opacities and extensive left mid and lower lung consolidative opacities and volume loss.   Electronically Signed   By: Sandi Mariscal M.D.   On: 12/05/2013 07:30   ASSESSMENT / PLAN:  PULMONARY A: COPD on nocturnal oxygen baseline, no evidence of exacerbation Acute hypoxemic / hypercarbic respiratory failure, re intubated 8/30, extubated 9/1, now DNI on HFNC L lung atelectasis / effusion, recurrent, s/p repeated bronchoscopy on 9/2, improved after L chest tube placed by IR on 9/4 R pleural effusion, drain was discussed with IR on 9/5 - they want to defer and see if more fluid accumulates Possible HCAP  Appears comfortable on high flow oxygen system this am.  Her cxr continues to show significant abnormalities bilat.  She is on broad spectrum abx, and is receiving aggressive pulmonary toilet.  P:   - continue on HFNC/Bilevel as needed -continue primaxin (8/30), ?when to d/c  CARDIOVASCULAR A: S/p PEA arrest due to hemorrhagic shock NSTEMI Acute on chronic systolic / diastolic congestive heart failure Complete heart block s/p pacemaker DNR P:  -ASA / Heparin contraindicated - recent hemorrhagic shock -ACEI contraindicated - AKI / borderline renal function -Would consider trial of diuresis for ?edema, but BP  soft today

## 2013-12-30 NOTE — Progress Notes (Signed)
eLink Physician-Brief Progress Note Patient Name: Felicia Diaz DOB: 14-Jan-1933 MRN: [id number]   Date of Service  2013-12-18  HPI/Events of Note  Has been on and off BiPAP, currently on BiPAP with marginal MS. BP dropping, currently 70's so no room for diuresis. Suspect she will continue to decline. She is DNR.   eICU Interventions  Small IVF bolus and follow     Intervention Category Major Interventions: Hypotension - evaluation and management  Lelynd Poer S. 18-Dec-2013, 3:25 PM

## 2013-12-30 NOTE — Progress Notes (Signed)
sats  69-72% with HFNC 60L, 100%, pt had one of nasal prongs out of nose upon entering room. After placing [place] in correctly, still unable to move sats past 70-72%.  April, RT notified of sats and requested her help to come see pt.

## 2013-12-30 NOTE — Progress Notes (Signed)
Patient has been made comfort care.  BIPAP removed per RT, pacemaker representative contacted, chaplain has visited family.

## 2013-12-30 NOTE — Progress Notes (Signed)
RT has been at bedside since requested, tried 100% NRB, sats 88%, and up to 90% after a while on NRB. RR mostly 26-30 , RT decided to place on BIPAP. See RT notes.

## 2013-12-30 NOTE — Progress Notes (Signed)
Chaplain was paged by nurse, for pt who was withdrawn from care and her family members wanted prayer. Chaplain visited family and pt and provided prayer and comfort. Family was accepting of both.    Charyl Dancer, Chaplain

## 2013-12-30 NOTE — Progress Notes (Signed)
Dr. Deitra Mayo met with family this afternoon and explained the poor prognosis of the pt.  He explained that despite using BiPAP to help her respiratory status and giving fluid to increase her BP, she is not showing signs of improvement.  They understood and said additional family and friends would come by to visit.  They were appreciative of all the good care she has received during her hospital stay.  No new orders given at this time.

## 2013-12-30 NOTE — Progress Notes (Signed)
Patient HQ:PRFFMBW Felicia Diaz      DOB: Dec 05, 1932      GYK:[id number]   Palliative Medicine Team at [facility] Progress Note    Subjective: On BiPAP, only responding to pain.     Filed Vitals:   12/28/2013 1200  BP: 89/38  Pulse: 86  Temp:   Resp: 20   Physical exam: GEN: difficult to arouse on BiPAP, grimaces to pain HEENT: [place], sclera anicteric CV: tachy LUNGS: decreased breath sounds ABD; soft, NT EXT: edematous  CBC    Component Value Date/Time   WBC 17.7* 12-28-13 0630   RBC 4.23 12-28-2013 0630   HGB 10.3* 28-Dec-2013 0630   HCT 37.2 12/28/2013 0630   PLT 286 2013/12/28 0630   MCV 87.9 2013-12-28 0630   MCH 24.3* 2013/12/28 0630   MCHC 27.7* 12-28-2013 0630   RDW 25.1* 28-Dec-2013 0630   LYMPHSABS 4.6* 11/08/2013 1210   MONOABS 1.9* 10/31/2013 1210   EOSABS 0.4 11/20/2013 1210   BASOSABS 0.4* 10/30/2013 1210    CMP     Component Value Date/Time   NA 145 2013-12-28 0630   K 5.0 12-28-2013 0630   CL 101 2013/12/28 0630   CO2 35* 12-28-2013 0630   GLUCOSE 90 12/28/13 0630   BUN 34* 12/28/13 0630   CREATININE 1.07 12-28-13 0630   CALCIUM 8.7 12-28-2013 0630   PROT 5.3* 11/29/2013 0433   ALBUMIN 2.3* 11/29/2013 0433   AST 17 11/29/2013 0433   ALT 33 11/29/2013 0433   ALKPHOS 64 11/29/2013 0433   BILITOT 0.6 11/29/2013 0433   GFRNONAA 48* 12/28/13 0630   GFRAA 56* 2013/12/28 0630      Assessment and plan: 80 yo female with PMHx of HTN, PVD, CHB s/p pacemaker in 12/2011, COPD, AVM. She presented on 8/27 with presumed UGIB and resultant PEA arrest. Stay complicated by hypoxia/hypercarbia requiring repeat intubation.   1. Code Status:  DNR   2. Goals of Care:  See initial consult note. Unfortunately breathing decompensated this afternoon with worsening hypoxia on High Flow [place].  Difficult to arouse for me this afternoon. RT adjusting BiPAP but still pulling fairly low tidal volumes. I have attempted to reach Terrilee Files who is health care POA. Left message to call me back. Spoke to brother  at bedside regarding my concerns around her clinical trajectory and he will try to reach out to family.  If not responding to BiPAP in next several hours, would be reasonable to switch to comfort care.    3. Symptom Management:  Hypoxic Resp Failure, Dyspnea- chest tube, medical management. I will d/c oxycontin in case this is playing role, but I doubt.  I think her decreased LOC is likely due to her decling resp status. Recently placed on BiPAP.  Prognosis very guarded as we are seeing more resp decline despite 11 day hospitalization, abx, chest tubes.  Chest Pain-Related to CPR and chest tube. Switch to PRN morphine only.   4. Psychosocial/Spiritual: Family support mostly from nephew [name] and Niece [name]. Also has friend  who visits often.   Doran Clay D.O. Palliative Medicine Team at [facility]  Pager: [phone] Team Phone: [phone]

## 2013-12-30 NOTE — Progress Notes (Signed)
eLink Physician-Brief Progress Note Patient Name: SUMAYA RIEDESEL DOB: 07-21-1932 MRN: [id number]   Date of Service  2013/12/09  HPI/Events of Note    eICU Interventions  Death declared at 9:45pm. Ok to d/c pacer at this time./      Intervention Category Minor Interventions: Other:  Ericah Scotto S. 2013-12-09, 10:18 PM

## 2013-12-30 DEATH — deceased

## 2014-03-10 ENCOUNTER — Encounter (HOSPITAL_COMMUNITY): Payer: Self-pay | Admitting: Internal Medicine

## 2015-05-31 IMAGING — CT CT IMAGE GUIDED DRAINAGE BY PERCUTANEOUS CATHETER
1 of 4 series · 12 of 32 positions shown, 18 images · non-contrast
Comparison: none

CLINICAL DATA: Left pleural effusion

[Series 3: i-spiral 5.0 b40f · axial · 0.70mm/px · z∈[-239,-46]mm · 12 of 65 slices shown, 18 images]
[im 5/65  soft-tissue]
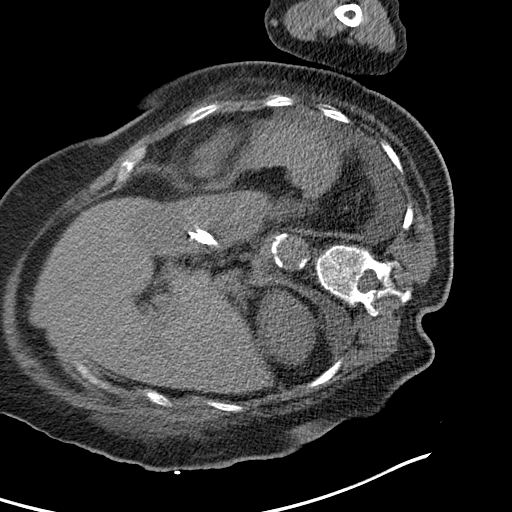
[im 5/65  bone]
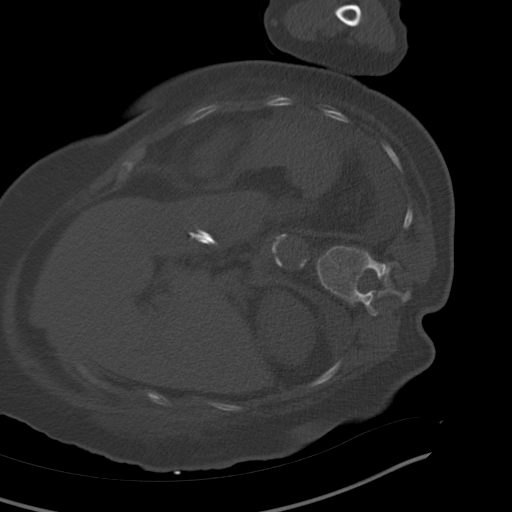
[im 10/65  soft-tissue]
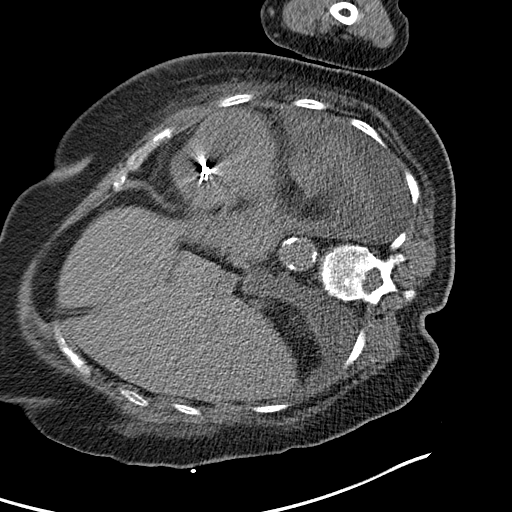
[im 15/65  soft-tissue]
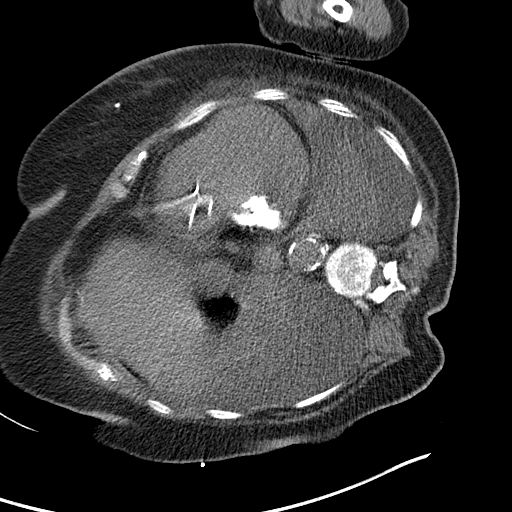
[im 20/65  soft-tissue]
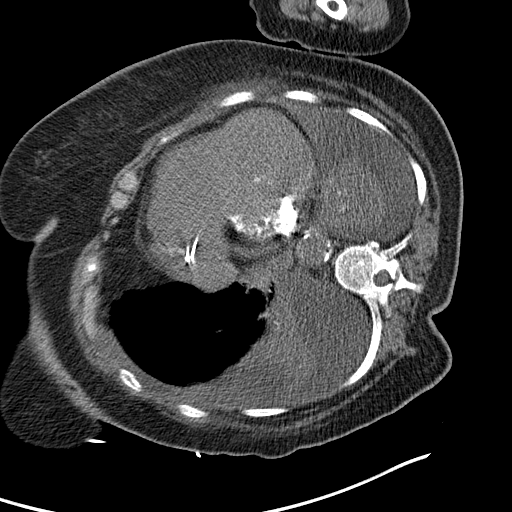
[im 25/65  soft-tissue]
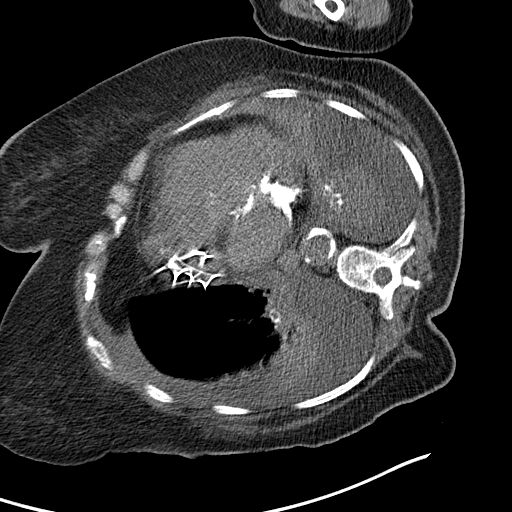
[im 30/65  soft-tissue]
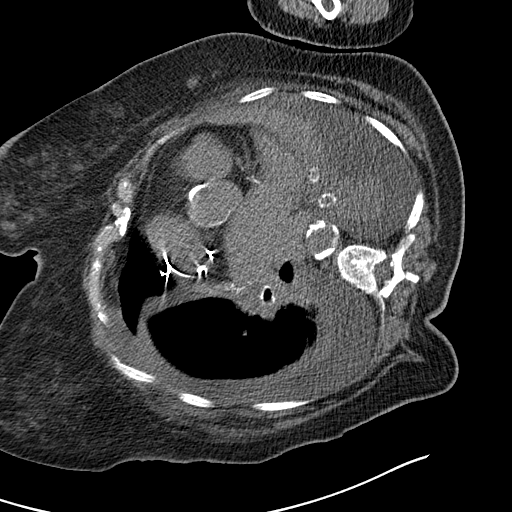
[im 35/65  soft-tissue]
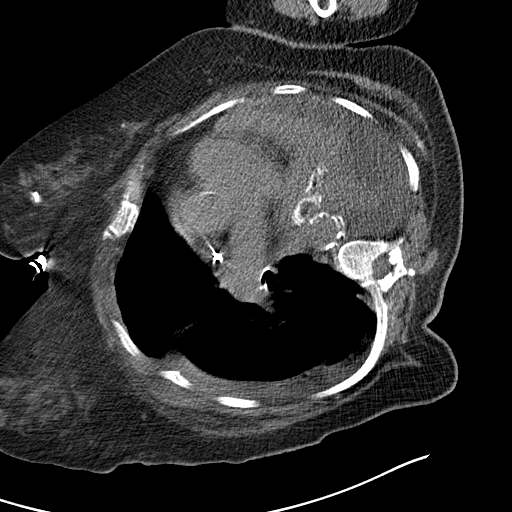
[im 40/65  soft-tissue]
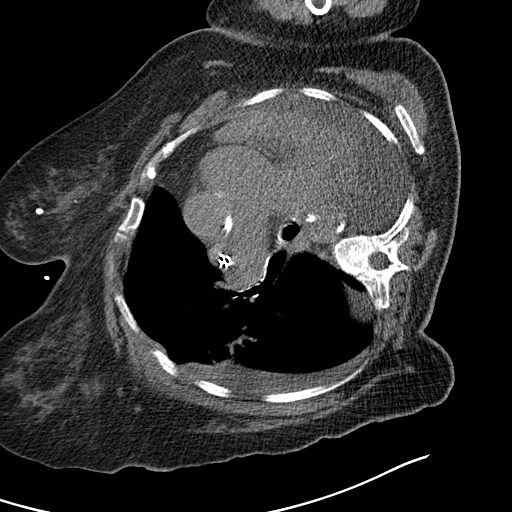
[im 45/65  soft-tissue]
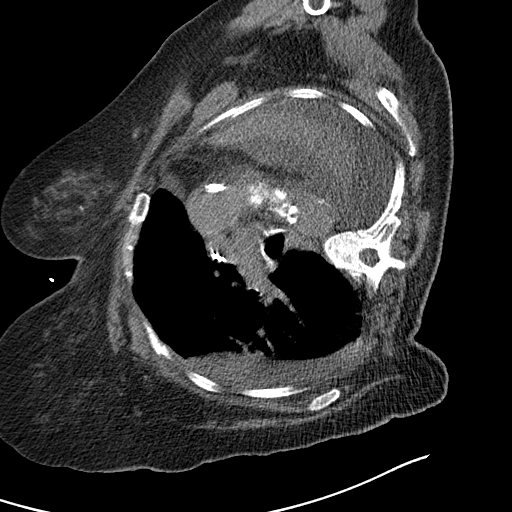
[im 45/65  lung]
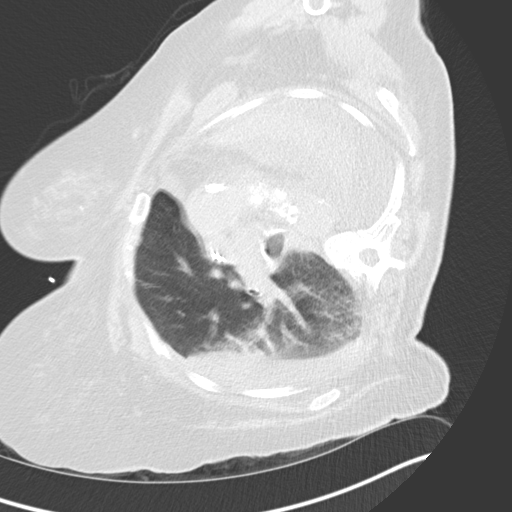
[im 45/65  bone]
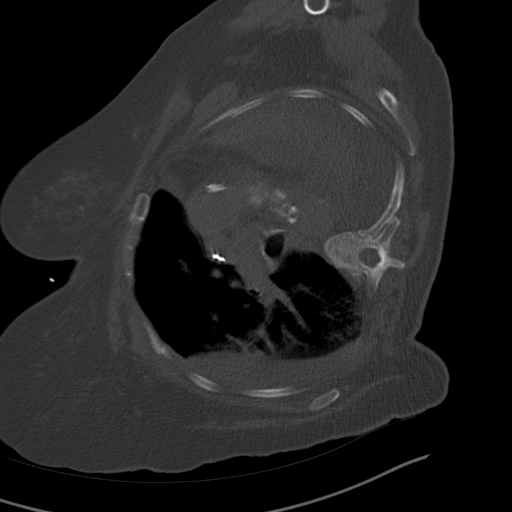
[im 50/65  soft-tissue]
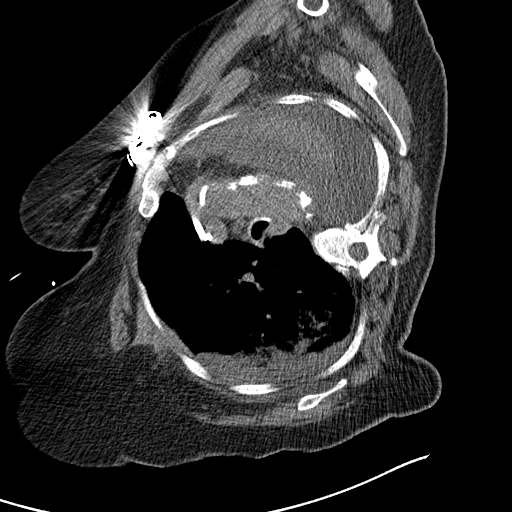
[im 50/65  lung]
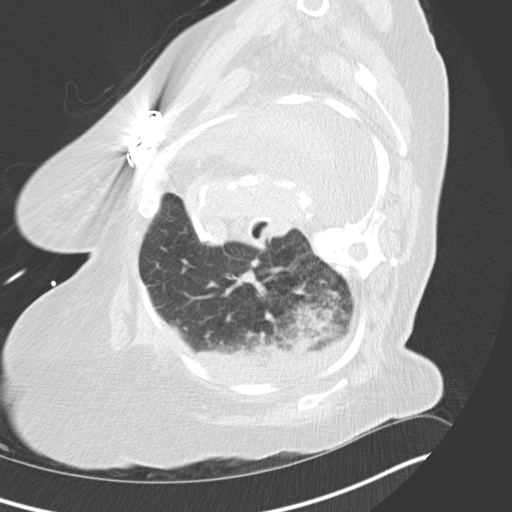
[im 55/65  soft-tissue]
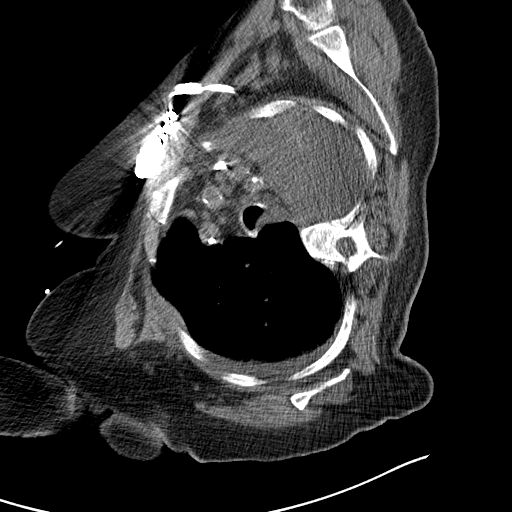
[im 55/65  lung]
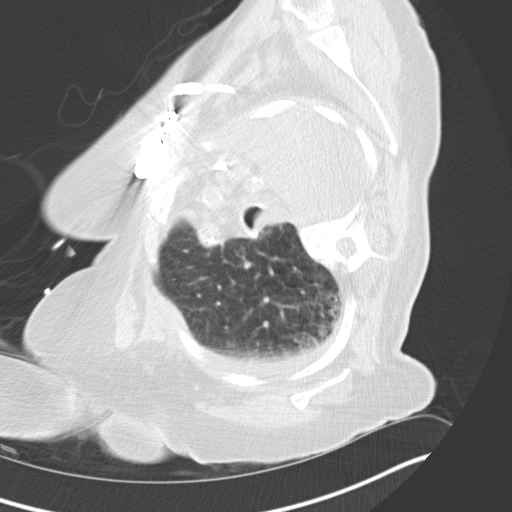
[im 60/65  soft-tissue]
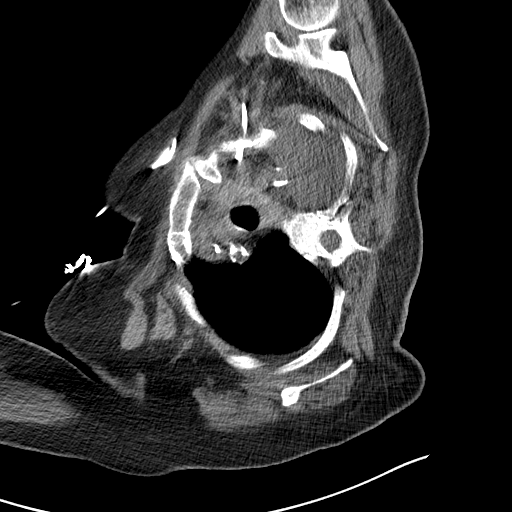
[im 60/65  lung]
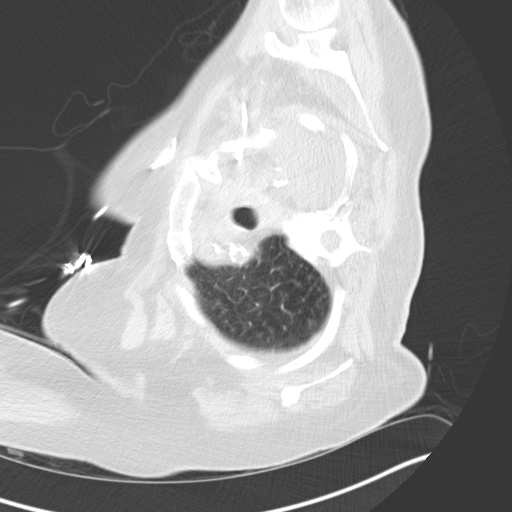

[12 of 32 positions shown; findings below may reference images not displayed]

EXAM:
CT IMAGE GUIDED DRAINAGE BY PERCUTANEOUS CATHETER

FLUOROSCOPY TIME:  None

MEDICATIONS AND MEDICAL HISTORY:
Versed 0.5 mg, Fentanyl 25 mcg.

Additional Medications: None.

ANESTHESIA/SEDATION:
Moderate sedation time: 15 minutes

CONTRAST:  None

PROCEDURE:
The procedure, risks, benefits, and alternatives were explained to
the patient. Questions regarding the procedure were encouraged and
answered. The patient understands and consents to the procedure.

The left posterior thorax in the right decubitus position was
prepped with Betadine in a sterile fashion, and a sterile drape was
applied covering the operative field. A sterile gown and sterile
gloves were used for the procedure.

Under CT guidance, an 18 gauge needle was inserted into the left
pleural space and removed over a J-wire. A 22 French dilator
followed by a 20 Rajwant Kaufmann were inserted advanced into
the pleural fluid collection towards the left lung base. It was sewn
in place. It was attached to a pleural vac at water seal.
FINDINGS: Images document 20 Ramzis Aminova Quik left thoracostomy tube
placement.

Mucous plugging in the left lower lobe airways and left lung
collapse are noted.

COMPLICATIONS:
None
IMPRESSION: Successful left 20 French thoracostomy. This will be attached to 20
cm water suction.

Consider repeat bronchoscopy if lung collapse continues.

## 2015-06-01 IMAGING — CR DG CHEST 1V PORT
1 series · 1 of 1 positions shown · non-contrast
Comparison: Yesterday

CLINICAL DATA: Lung collapse

EXAM:
PORTABLE CHEST - 1 VIEW

[AP]
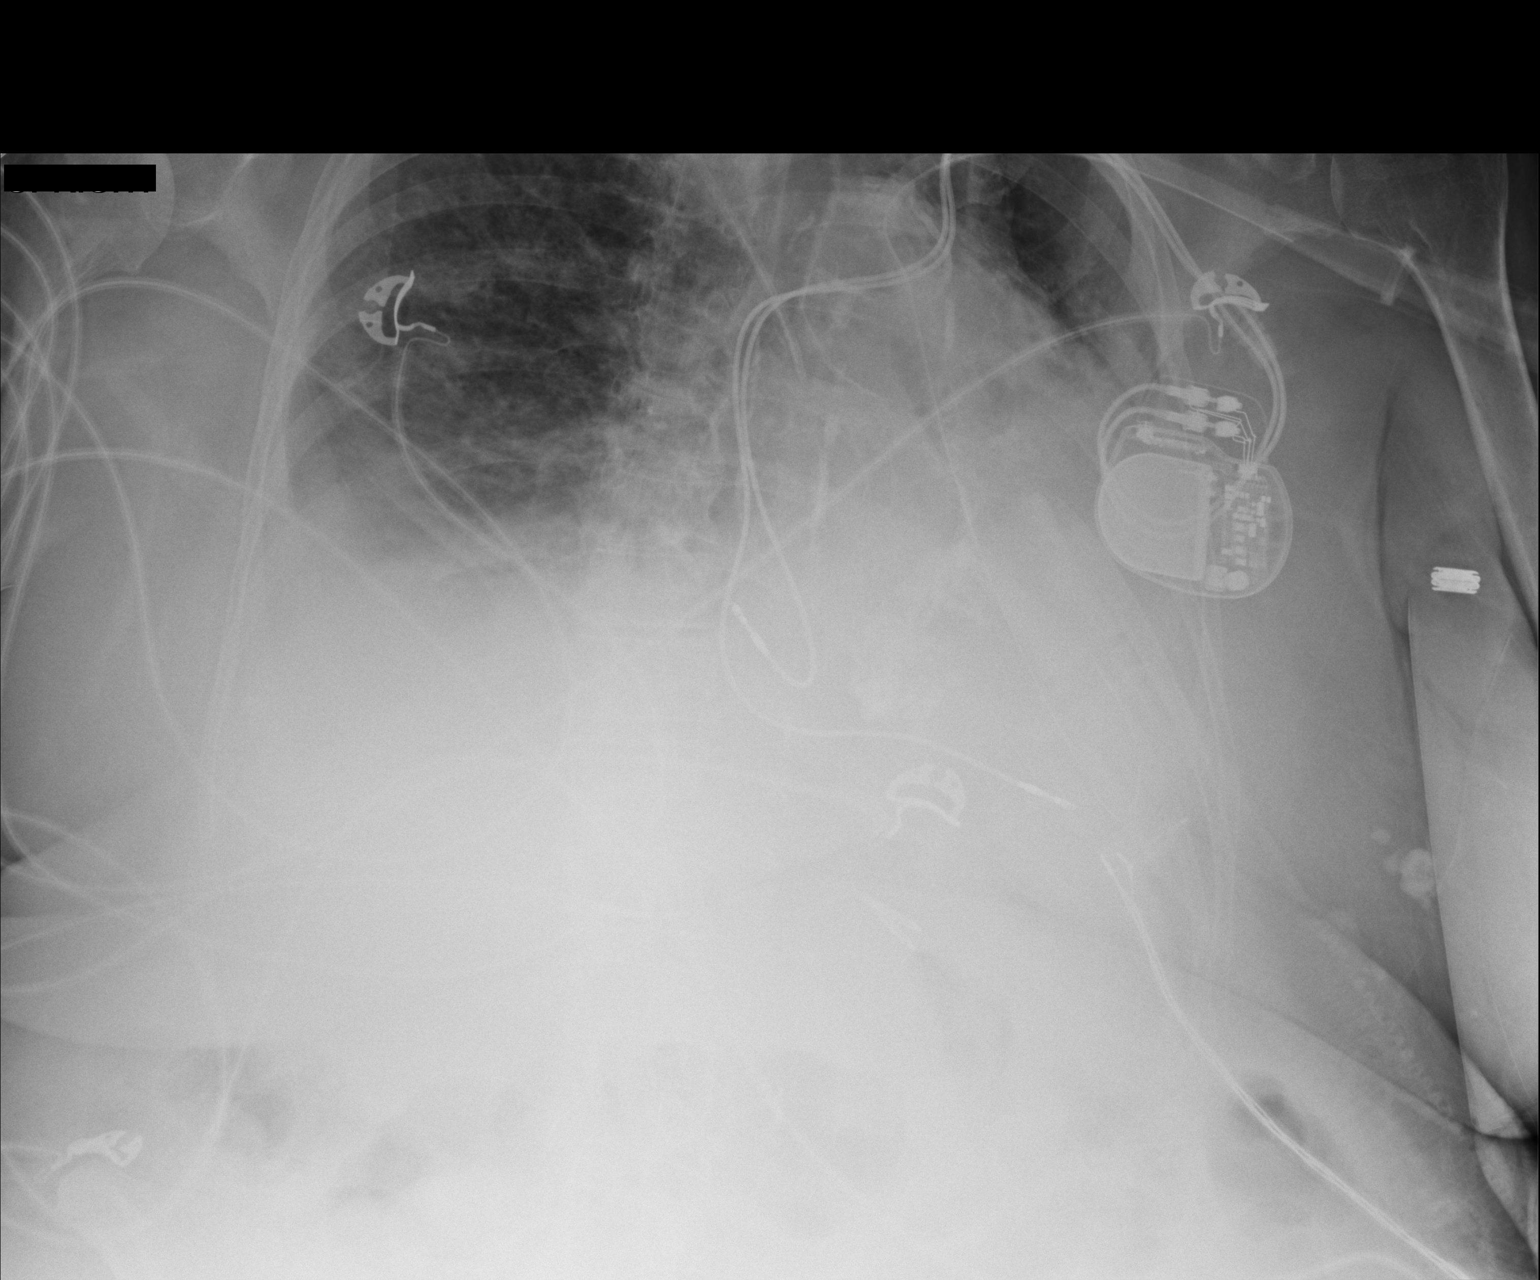

[1 of 1 positions shown; findings below may reference images not displayed]

FINDINGS: Left chest tube is stable. No pneumothorax. There is persistent
shift of the mediastinum to the left compatible with left lower lobe
collapse. Left upper lobe is aerated. Right pleural effusion stable.
Right basilar opacity stable. Left subclavian pacemaker device
unchanged. Extensive mitral annular calcifications.
IMPRESSION: Stable exam. Left chest tube is unchanged. Left lower lobe collapse
persists. Right pleural effusion.

## 2015-06-02 IMAGING — CR DG CHEST 1V PORT
1 series · 1 of 1 positions shown · non-contrast
Comparison: 12/04/2013; 12/03/2013; 12/02/2013; 11/30/2013; chest
CT - 11/30/2013

CLINICAL DATA: Evaluate pulmonary infiltrates

EXAM:
PORTABLE CHEST - 1 VIEW

[AP]
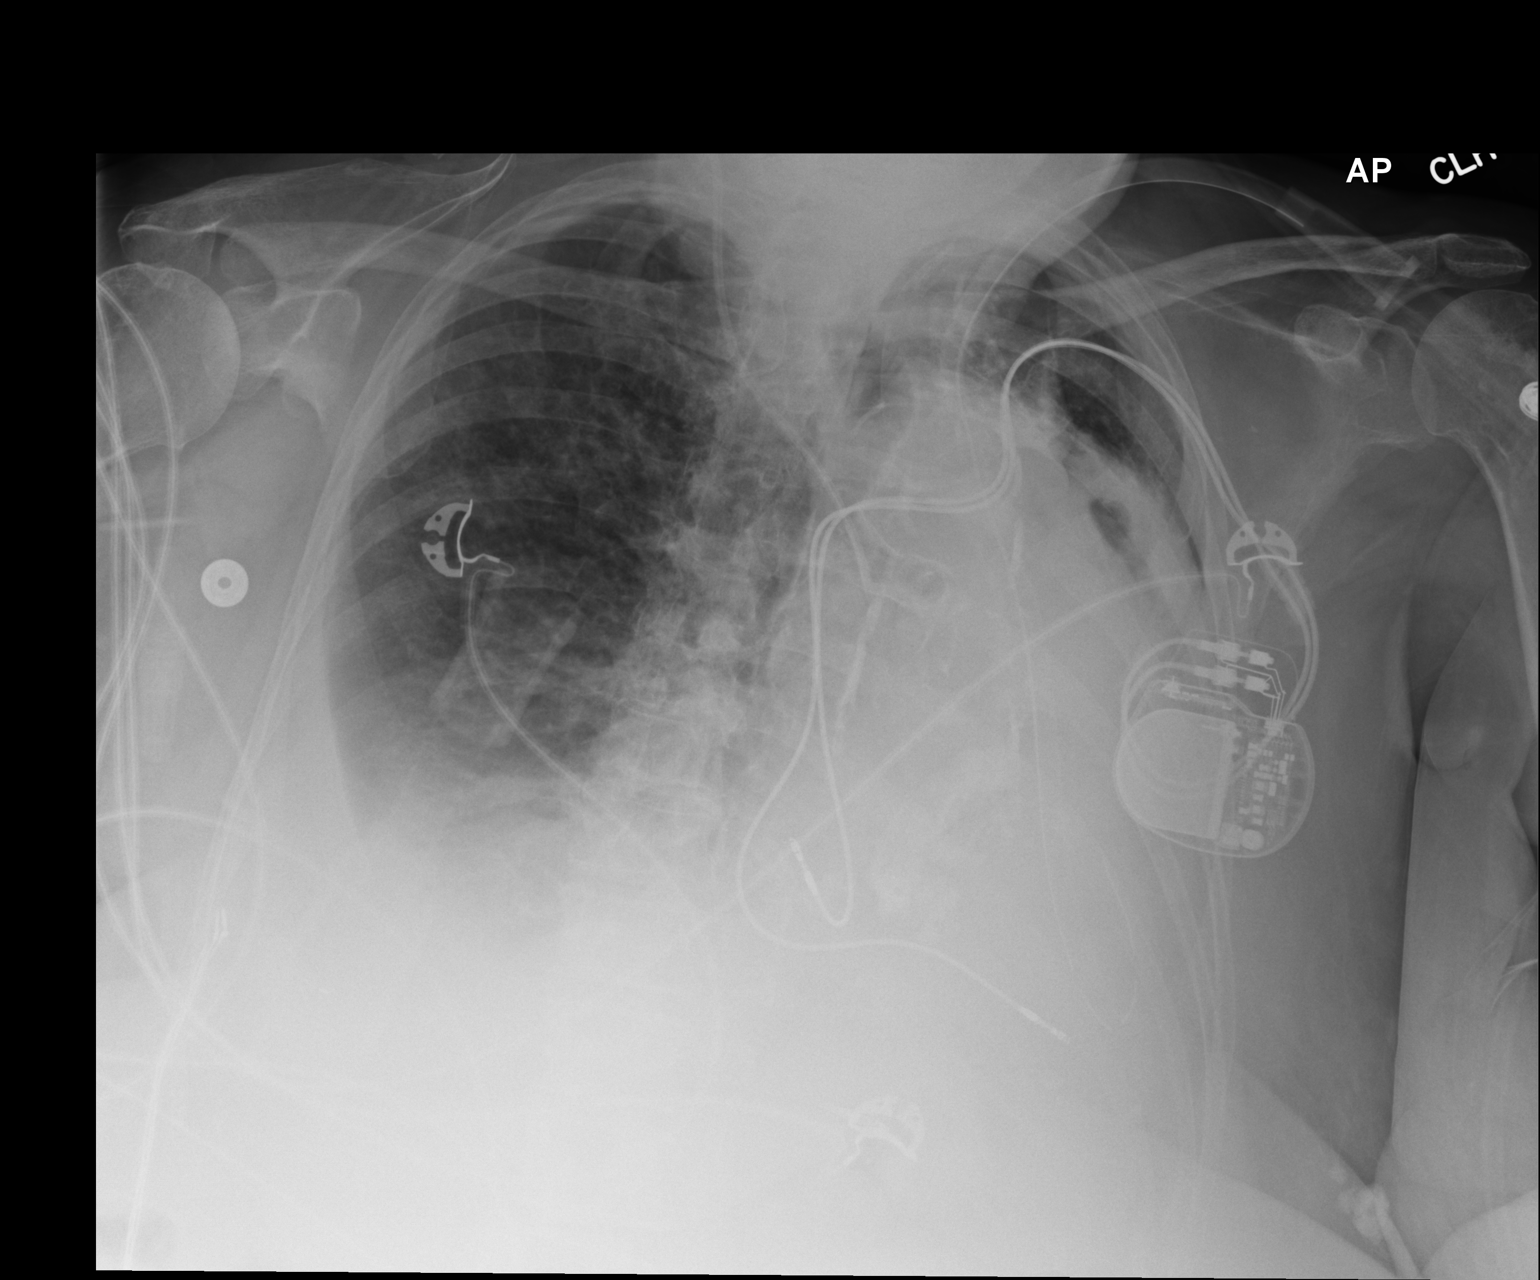

[1 of 1 positions shown; findings below may reference images not displayed]

FINDINGS: Grossly unchanged enlarged cardiac silhouette and mediastinal
contours given patient rotation to the left. Atherosclerotic plaque
within the thoracic aorta. Exuberant calcifications within the
mitral valve annulus. Stable position of support apparatus. No
pneumothorax. The pulmonary vasculature remains indistinct with
cephalization of flow and unchanged small bilateral pleural
effusions. Left mid and lower lung heterogeneous airspace opacities
are unchanged. No new focal airspace opacities. Unchanged bones.
IMPRESSION: [:
IMPRESSION: [
1.  Stable positioning of support apparatus.  No pneumothorax.
2. Grossly unchanged findings of pulmonary edema and small bilateral
effusions.
3. Grossly unchanged right basilar heterogeneous opacities and
extensive left mid and lower lung consolidative opacities and volume
loss.

## 2015-06-03 IMAGING — CR DG CHEST 1V PORT
1 series · 1 of 1 positions shown · non-contrast
Comparison: 12/05/2013

CLINICAL DATA: Followup congestive heart failure and pleural
effusion.

EXAM:
PORTABLE CHEST - 1 VIEW

[AP]
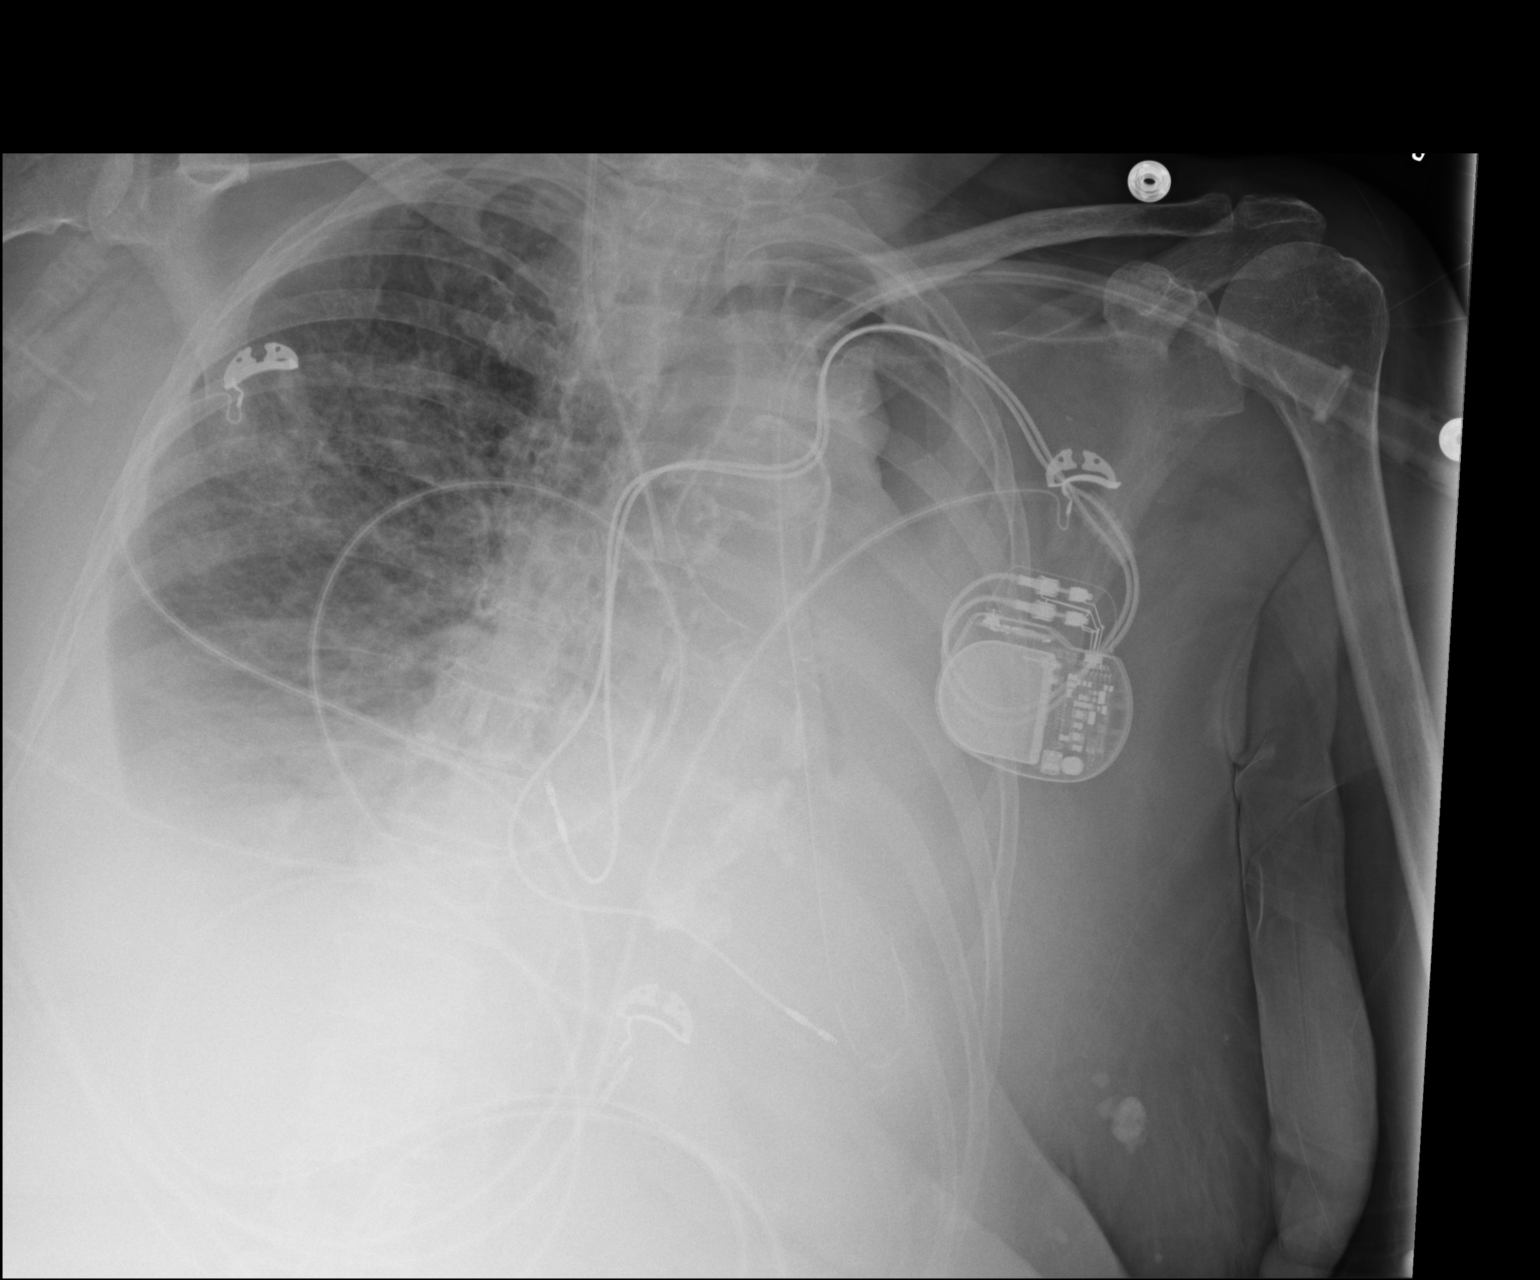

[1 of 1 positions shown; findings below may reference images not displayed]

FINDINGS: Left-sided chest tube remains in place, however there is a
persistent large pleural effusion with small pneumothorax component
seen in the apex. There is persistent left lung volume loss.

Diffuse right lung interstitial infiltrates again seen, suspicious
for pulmonary edema. Small right pleural effusion is also stable.
Dual lead transvenous pacemaker and right internal jugular central
venous catheter remain in appropriate position.
IMPRESSION: No significant change in left lung volume loss and large left hydro
pneumothorax, with left chest tube remaining in place.

No significant change in probable diffuse right lung interstitial
edema and small right pleural effusion.

## 2015-08-16 ENCOUNTER — Other Ambulatory Visit (HOSPITAL_COMMUNITY): Payer: Medicare Other

## 2015-08-16 ENCOUNTER — Ambulatory Visit: Payer: Medicare Other | Admitting: Vascular Surgery
